# Patient Record
Sex: Male | Born: 1955 | Race: White | Hispanic: No | Marital: Married | Smoking: Never smoker
Health system: Southern US, Community
[De-identification: ages and names within clinical notes are randomized; demographics above are authoritative.]

## PROBLEM LIST (undated history)

## (undated) DIAGNOSIS — I10 Essential (primary) hypertension: Secondary | ICD-10-CM

## (undated) DIAGNOSIS — I480 Paroxysmal atrial fibrillation: Secondary | ICD-10-CM

## (undated) DIAGNOSIS — G4733 Obstructive sleep apnea (adult) (pediatric): Secondary | ICD-10-CM

## (undated) HISTORY — PX: VASECTOMY: SHX75

## (undated) HISTORY — DX: Essential (primary) hypertension: I10

## (undated) HISTORY — DX: Obstructive sleep apnea (adult) (pediatric): G47.33

## (undated) HISTORY — PX: TONSILLECTOMY: SUR1361

## (undated) HISTORY — DX: Paroxysmal atrial fibrillation: I48.0

## (undated) HISTORY — PX: CHOLECYSTECTOMY: SHX55

## (undated) HISTORY — PX: ABLATION: SHX5711

---

## 2000-07-21 ENCOUNTER — Ambulatory Visit (HOSPITAL_COMMUNITY): Admission: RE | Admit: 2000-07-21 | Discharge: 2000-07-21 | Payer: Self-pay | Admitting: Family Medicine

## 2000-07-21 ENCOUNTER — Encounter: Payer: Self-pay | Admitting: Family Medicine

## 2000-12-14 ENCOUNTER — Ambulatory Visit (HOSPITAL_COMMUNITY): Admission: RE | Admit: 2000-12-14 | Discharge: 2000-12-15 | Payer: Self-pay | Admitting: General Surgery

## 2000-12-14 ENCOUNTER — Encounter (HOSPITAL_BASED_OUTPATIENT_CLINIC_OR_DEPARTMENT_OTHER): Payer: Self-pay | Admitting: General Surgery

## 2011-07-01 DIAGNOSIS — I480 Paroxysmal atrial fibrillation: Secondary | ICD-10-CM

## 2011-07-01 HISTORY — DX: Paroxysmal atrial fibrillation: I48.0

## 2013-05-20 ENCOUNTER — Telehealth: Payer: Self-pay

## 2013-05-20 MED ORDER — METOPROLOL SUCCINATE ER 25 MG PO TB24: 25.0000 mg | ORAL_TABLET | Freq: Every day | ORAL | Status: DC

## 2013-05-20 MED ORDER — AMLODIPINE BESY-BENAZEPRIL HCL 10-40 MG PO CAPS: 1.0000 | ORAL_CAPSULE | Freq: Every day | ORAL | Status: DC

## 2013-05-20 NOTE — Telephone Encounter (Signed)
Pt aware meds sent in

## 2013-05-21 ENCOUNTER — Encounter: Payer: Self-pay | Admitting: *Deleted

## 2013-05-21 ENCOUNTER — Encounter: Payer: Self-pay | Admitting: Cardiology

## 2013-05-21 DIAGNOSIS — G4733 Obstructive sleep apnea (adult) (pediatric): Secondary | ICD-10-CM | POA: Insufficient documentation

## 2013-05-21 DIAGNOSIS — I480 Paroxysmal atrial fibrillation: Secondary | ICD-10-CM | POA: Insufficient documentation

## 2013-05-24 ENCOUNTER — Encounter: Payer: Self-pay | Admitting: Cardiology

## 2013-05-24 ENCOUNTER — Encounter (INDEPENDENT_AMBULATORY_CARE_PROVIDER_SITE_OTHER): Payer: Self-pay

## 2013-05-24 ENCOUNTER — Ambulatory Visit (INDEPENDENT_AMBULATORY_CARE_PROVIDER_SITE_OTHER): Payer: PRIVATE HEALTH INSURANCE | Admitting: Cardiology

## 2013-05-24 VITALS — BP 140/92 | HR 67 | Ht 75.0 in | Wt 251.0 lb

## 2013-05-24 DIAGNOSIS — I10 Essential (primary) hypertension: Secondary | ICD-10-CM

## 2013-05-24 DIAGNOSIS — I4891 Unspecified atrial fibrillation: Secondary | ICD-10-CM

## 2013-05-24 DIAGNOSIS — G4733 Obstructive sleep apnea (adult) (pediatric): Secondary | ICD-10-CM

## 2013-05-24 NOTE — Progress Notes (Signed)
  [address] [place], [place]  [address] Phone: [phone] Fax:  [phone]  Date:  05/24/2013   ID:  Adrian Clark, DOB 10-29-1955, MRN [id number]  PCP:  No primary provider on file.  Cardiologist:  Armanda Magic, MD     History of Present Illness: Adrian Clark is a 57 y.o. male with a history of atrial fibrillation, HTN and OSA who presents today for followup.  He is doing well.  He denies any chest pain, SOB, DOE, LE edema, dizziness, palpitations or syncope.  He tolerates his CPAP well.  He tolerates the mask and feels the pressure is adequate. He feels rested in the am and has no daytime sleepiness.   Wt Readings from Last 3 Encounters:  05/24/13 251 lb (113.853 kg)     Past Medical History  Diagnosis Date  . OSA (obstructive sleep apnea)   . HTN (hypertension)   . Atrial fibrillation 2013    s/p ablation     Current Outpatient Prescriptions  Medication Sig Dispense Refill  . amLODipine-benazepril (LOTREL) 10-40 MG per capsule Take 1 capsule by mouth daily.  30 capsule  6  . aspirin 325 MG tablet Take 325 mg by mouth daily.      Marland Kitchen HAWTHORN BERRY PO Take by mouth 2 (two) times daily.      . metoprolol succinate (TOPROL XL) 25 MG 24 hr tablet Take 1 tablet (25 mg total) by mouth daily.  30 tablet  6  . Vitamins-Lipotropics (LIPOFLAVONOID) TABS Take by mouth 2 (two) times daily.       No current facility-administered medications for this visit.    Allergies:   No Known Allergies  Social History:  The patient  reports that he has never smoked. He does not have any smokeless tobacco history on file. He reports that he does not use illicit drugs.   Family History:  The patient's family history includes Prostate cancer in his father; Uterine cancer in his mother.   ROS:  Please see the history of present illness.      All other systems reviewed and negative.   PHYSICAL EXAM: VS:  Ht 6\' 3"  (1.905 m)  Wt 251 lb (113.853 kg)  BMI 31.37 kg/m2 Well  nourished, well developed, in no acute distress HEENT: normal Neck: no JVD Cardiac:  normal S1, S2; RRR; no murmur Lungs:  clear to auscultation bilaterally, no wheezing, rhonchi or rales Abd: soft, nontender, no hepatomegaly Ext: no edema Skin: warm and dry Neuro:  CNs 2-12 intact, no focal abnormalities noted   EKG:  NSR with LVH by voltage with no ST changes    ASSESSMENT AND PLAN:  1. PAF s/p afib ablation and maintaining NSR  - continue ASA and Toprol 2. OSA on CPAP and tolerating well  - he will drop off his SD card for a download this week 3. HTN with slightly elevated DBP today - at home his BP runs around 130/65mmHg  - continue Toprol and Lotrel  Followup with me in 1 year  Signed, Armanda Magic, MD 05/24/2013 4:57 PM

## 2013-05-24 NOTE — Patient Instructions (Signed)
Your physician recommends that you continue on your current medications as directed. Please refer to the Current Medication list given to you today.  Please remember to bring your download card in this week or next. We are closed on Thanksgiving and also I will not be here Friday 06/03/13.  Your physician wants you to follow-up in: 1 Year f/u with Dr. Sherlyn Lick will receive a reminder letter in the mail two months in advance. If you don't receive a letter, please call our office to schedule the follow-up appointment.

## 2013-06-09 DIAGNOSIS — I10 Essential (primary) hypertension: Secondary | ICD-10-CM | POA: Insufficient documentation

## 2013-06-09 DIAGNOSIS — Z8679 Personal history of other diseases of the circulatory system: Secondary | ICD-10-CM | POA: Insufficient documentation

## 2013-06-24 ENCOUNTER — Encounter: Payer: Self-pay | Admitting: Cardiology

## 2013-07-08 ENCOUNTER — Emergency Department (HOSPITAL_COMMUNITY): Payer: 59

## 2013-07-08 ENCOUNTER — Inpatient Hospital Stay (HOSPITAL_COMMUNITY)
Admission: EM | Admit: 2013-07-08 | Discharge: 2013-07-12 | DRG: 310 | Disposition: A | Discharge status: Home / Self Care | Payer: 59 | Attending: Cardiology | Admitting: Cardiology

## 2013-07-08 ENCOUNTER — Ambulatory Visit (INDEPENDENT_AMBULATORY_CARE_PROVIDER_SITE_OTHER): Payer: 59 | Admitting: *Deleted

## 2013-07-08 ENCOUNTER — Encounter (HOSPITAL_COMMUNITY): Payer: Self-pay | Admitting: Emergency Medicine

## 2013-07-08 ENCOUNTER — Telehealth: Payer: Self-pay | Admitting: Cardiology

## 2013-07-08 VITALS — BP 108/72 | HR 133 | Ht 75.0 in | Wt 249.0 lb

## 2013-07-08 DIAGNOSIS — I4891 Unspecified atrial fibrillation: Secondary | ICD-10-CM

## 2013-07-08 DIAGNOSIS — Z8042 Family history of malignant neoplasm of prostate: Secondary | ICD-10-CM

## 2013-07-08 DIAGNOSIS — Z8049 Family history of malignant neoplasm of other genital organs: Secondary | ICD-10-CM

## 2013-07-08 DIAGNOSIS — I480 Paroxysmal atrial fibrillation: Secondary | ICD-10-CM | POA: Diagnosis present

## 2013-07-08 DIAGNOSIS — Z9852 Vasectomy status: Secondary | ICD-10-CM

## 2013-07-08 DIAGNOSIS — G4733 Obstructive sleep apnea (adult) (pediatric): Secondary | ICD-10-CM | POA: Diagnosis present

## 2013-07-08 DIAGNOSIS — Z9089 Acquired absence of other organs: Secondary | ICD-10-CM

## 2013-07-08 DIAGNOSIS — I10 Essential (primary) hypertension: Secondary | ICD-10-CM | POA: Diagnosis present

## 2013-07-08 LAB — CBC
HCT: 44 % (ref 39.0–52.0)
HEMOGLOBIN: 15.1 g/dL (ref 13.0–17.0)
MCH: 29 pg (ref 26.0–34.0)
MCHC: 34.3 g/dL (ref 30.0–36.0)
MCV: 84.5 fL (ref 78.0–100.0)
PLATELETS: 352 10*3/uL (ref 150–400)
RBC: 5.21 MIL/uL (ref 4.22–5.81)
RDW: 13.3 % (ref 11.5–15.5)
WBC: 9.4 10*3/uL (ref 4.0–10.5)

## 2013-07-08 LAB — BASIC METABOLIC PANEL
BUN: 24 mg/dL — ABNORMAL HIGH (ref 6–23)
CO2: 23 mEq/L (ref 19–32)
Calcium: 9.3 mg/dL (ref 8.4–10.5)
Chloride: 101 mEq/L (ref 96–112)
Creatinine, Ser: 1.12 mg/dL (ref 0.50–1.35)
GFR calc Af Amer: 82 mL/min — ABNORMAL LOW (ref >90)
GFR, EST NON AFRICAN AMERICAN: 71 mL/min — AB (ref >90)
GLUCOSE: 101 mg/dL — AB (ref 70–99)
POTASSIUM: 4.1 meq/L (ref 3.7–5.3)
SODIUM: 139 meq/L (ref 137–147)

## 2013-07-08 LAB — MAGNESIUM: MAGNESIUM: 2.2 mg/dL (ref 1.5–2.5)

## 2013-07-08 LAB — POCT I-STAT TROPONIN I: Troponin i, poc: 0 ng/mL (ref 0.00–0.08)

## 2013-07-08 LAB — PRO B NATRIURETIC PEPTIDE: PRO B NATRI PEPTIDE: 950.9 pg/mL — AB (ref 0–125)

## 2013-07-08 MED ORDER — DILTIAZEM LOAD VIA INFUSION
10.0000 mg | Freq: Once | INTRAVENOUS | Status: AC
  Administered 2013-07-08: 10 mg via INTRAVENOUS
  Filled 2013-07-08: qty 10

## 2013-07-08 MED ORDER — METOPROLOL SUCCINATE ER 25 MG PO TB24
25.0000 mg | ORAL_TABLET | Freq: Every day | ORAL | Status: DC
  Administered 2013-07-09: 25 mg via ORAL
  Filled 2013-07-08: qty 1

## 2013-07-08 MED ORDER — SODIUM CHLORIDE 0.9 % IV BOLUS (SEPSIS)
1000.0000 mL | Freq: Once | INTRAVENOUS | Status: AC
  Administered 2013-07-08: 1000 mL via INTRAVENOUS

## 2013-07-08 MED ORDER — DILTIAZEM HCL 100 MG IV SOLR
5.0000 mg/h | INTRAVENOUS | Status: DC
  Administered 2013-07-08 (×2): 5 mg/h via INTRAVENOUS
  Filled 2013-07-08: qty 100

## 2013-07-08 MED ORDER — RIVAROXABAN 20 MG PO TABS
20.0000 mg | ORAL_TABLET | Freq: Every day | ORAL | Status: DC
  Administered 2013-07-08 – 2013-07-12 (×5): 20 mg via ORAL
  Filled 2013-07-08 (×5): qty 1

## 2013-07-08 MED ORDER — DILTIAZEM HCL 100 MG IV SOLR: 5.0000 mg/h | INTRAVENOUS | Status: DC

## 2013-07-08 MED ORDER — ACETAMINOPHEN 325 MG PO TABS: 650.0000 mg | ORAL_TABLET | ORAL | Status: DC | PRN

## 2013-07-08 MED ORDER — ONDANSETRON HCL 4 MG/2ML IJ SOLN: 4.0000 mg | Freq: Four times a day (QID) | INTRAMUSCULAR | Status: DC | PRN

## 2013-07-08 MED ORDER — DILTIAZEM HCL 100 MG IV SOLR
5.0000 mg/h | INTRAVENOUS | Status: DC
  Administered 2013-07-08 – 2013-07-09 (×2): 5 mg/h via INTRAVENOUS
  Filled 2013-07-08 (×2): qty 100

## 2013-07-08 NOTE — ED Notes (Signed)
Patient sent from Dr. Landis Gandy office, pt states he felt irreg since Wednesday.  Currently in afib, rate around 130-150,  Denies chest pain, sob, dizziness.  Previous diagnosed in 2012, had previous ablation and cardioversion.

## 2013-07-08 NOTE — ED Provider Notes (Signed)
CSN: [id number]     Arrival date & time 07/08/13  1619 History   First MD Initiated Contact with Patient 07/08/13 1634     Chief Complaint  Patient presents with  . Atrial Fibrillation   (Consider location/radiation/quality/duration/timing/severity/associated sxs/prior Treatment) HPI 58 yo M presents with tachycardia.   No chest pain, mild SOB / no radiation / shortness of breath, dyspnea on exertion / intermittently / moderate severity / no other associated symptoms / Has been treated prior for afib in the past, ablation 2 years ago.   Past Medical History  Diagnosis Date  . OSA (obstructive sleep apnea)   . HTN (hypertension)   . Atrial fibrillation 2013    s/p ablation    Past Surgical History  Procedure Laterality Date  . Cholecystectomy    . Tonsillectomy    . Vasectomy     Family History  Problem Relation Age of Onset  . Uterine cancer Mother   . Prostate cancer Father    History  Substance Use Topics  . Smoking status: Never Smoker   . Smokeless tobacco: Not on file  . Alcohol Use: Not on file    Review of Systems  Constitutional: Negative for fever and chills.  HENT: Negative for sore throat.   Eyes: Negative for pain.  Respiratory: Positive for shortness of breath. Negative for cough.   Cardiovascular: Positive for palpitations. Negative for chest pain.  Gastrointestinal: Negative for nausea, vomiting and abdominal pain.  Genitourinary: Negative for dysuria and flank pain.  Musculoskeletal: Negative for back pain and neck pain.  Skin: Negative for rash.  Neurological: Negative for seizures and headaches.    Allergies  Review of patient's allergies indicates no known allergies.  Home Medications   No current outpatient prescriptions on file. BP 111/74  Pulse 102  Temp(Src) 98.1 F (36.7 C) (Oral)  Resp 14  SpO2 97% Physical Exam  Constitutional: He is oriented to person, place, and time. He appears well-developed and well-nourished. No distress.   HENT:  Head: Normocephalic and atraumatic.  Eyes: Pupils are equal, round, and reactive to light.  Neck: Normal range of motion.  Cardiovascular: An irregularly irregular rhythm present. Tachycardia present.   Pulmonary/Chest: Effort normal and breath sounds normal.  Abdominal: Soft. He exhibits no distension. There is no tenderness.  Musculoskeletal: Normal range of motion.  Neurological: He is alert and oriented to person, place, and time.  Skin: Skin is warm. He is not diaphoretic.    ED Course  Procedures (including critical care time) Labs Review Labs Reviewed  BASIC METABOLIC PANEL - Abnormal; Notable for the following:    Glucose, Bld 101 (*)    BUN 24 (*)    GFR calc non Af Amer 71 (*)    GFR calc Af Amer 82 (*)    All other components within normal limits  PRO B NATRIURETIC PEPTIDE - Abnormal; Notable for the following:    Pro B Natriuretic peptide (BNP) 950.9 (*)    All other components within normal limits  CBC  POCT I-STAT TROPONIN I   Imaging Review Dg Chest Port 1 View  07/08/2013   CLINICAL DATA:  Atrial fibrillation  EXAM: PORTABLE CHEST - 1 VIEW  COMPARISON:  None.  FINDINGS: The heart size and mediastinal contours are within normal limits. Both lungs are clear. The visualized skeletal structures are unremarkable.  IMPRESSION: No active disease.   Electronically Signed   By: Inez Catalina M.D.   On: 07/08/2013 17:10    EKG  Interpretation    Date/Time:  Friday July 08 2013 16:23:18 EST Ventricular Rate:  143 PR Interval:    QRS Duration: 94 QT Interval:  290 QTC Calculation: 447 R Axis:   -27 Text Interpretation:  Atrial fibrillation with rapid ventricular response Nonspecific ST abnormality , probably digitalis effect Abnormal ECG remote prior EKG with sinus rhythm Confirmed by HORTON  MD, COURTNEY ([id number]) on 07/08/2013 4:37:18 PM            MDM   1. Atrial fibrillation with rapid ventricular response   2. Atrial fibrillation with RVR    58  yo M with hx of prior afib s/p ablation presents as transfer from Cardiology office for low BP (SBP in 90s) and afib with RVR (HR 140s-150s).   Upon arrival here, patient still tachycardic (120s), otherwise HDS. EKG and PE c/w afib with RVR. Patient previously on Toprol XL. Will attempt dilt drip, bolus.   Dilt bolus caused soft BPs, fluids given. Patient with no change in symptoms/status. BPs rebounded. Normotensive. Patient with improvement in HR (now in 90s-100s). Will continue dilt gtt, admit to cardiology. Cards consulted, triaged to hospital. Admitted in stable condition. Patient seen and evaluated by myself and my attending, Dr. Dina Rich.     Freddi Che, MD 07/08/13 2132

## 2013-07-08 NOTE — Progress Notes (Signed)
Pt in for an EKG. Pt states that he donated blood before going into  A-fib. EKG done per nurse read per Dr. [name] MD. A-fib 133 beats/minute. BP right arm 108/72 left arm 98/66. Pt states feels light headed when going up a couple of flights of stairs. Dr. [name] recommended for pt to go to the ER at [facility].  Pt agreed, wife will drive pt to the ER. Chris in the ER and Wannetta Sender trent aware.

## 2013-07-08 NOTE — Telephone Encounter (Signed)
Please have patient see Truitt Merle or Richardson Dopp and if no openings have him come in for an EKG check

## 2013-07-08 NOTE — ED Provider Notes (Signed)
I saw and evaluated the patient, reviewed the resident's note and I agree with the findings and plan.  EKG Interpretation    Date/Time:  Friday July 08 2013 16:23:18 EST Ventricular Rate:  143 PR Interval:    QRS Duration: 94 QT Interval:  290 QTC Calculation: 447 R Axis:   -27 Text Interpretation:  Atrial fibrillation with rapid ventricular response Nonspecific ST abnormality , probably digitalis effect Abnormal ECG remote prior EKG with sinus rhythm Confirmed by HORTON  MD, COURTNEY ([id number]) on 07/08/2013 4:37:18 PM            Patient presents in A. fib with RVR. He has a history of the same. He is not currently on anticoagulation. Initial heart rate in the 130s and 140s. Patient was started on a diltiazem drip. He has had no chest pain. Patient was rate controlled in the ER. He will be admitted to the cardiology service. He is a patient of Dr. Tanna Furry.  Merryl Hacker, MD 07/08/13 2250

## 2013-07-08 NOTE — Telephone Encounter (Signed)
TO Dr Turner to advise.  

## 2013-07-08 NOTE — ED Notes (Signed)
Dr. Walton at bedside 

## 2013-07-08 NOTE — Telephone Encounter (Signed)
New message     Pt says he is in afib and want to see dr turner today

## 2013-07-08 NOTE — Telephone Encounter (Signed)
Follow UP:  Pt states he is still waiting for a call back.

## 2013-07-08 NOTE — ED Notes (Signed)
X-ray at bedside

## 2013-07-08 NOTE — ED Notes (Signed)
Presents with atrial fibrillation rate of 140s-150s, began weds associated with SOB with exertion denies pain. Pt alert, oriented, answers all questions appropriately.

## 2013-07-08 NOTE — H&P (Signed)
Adrian Clark is an 58 y.o. male.   Chief Complaint: Afib HPI:  Adrian Clark is a 58 y.o. male with a history of atrial fibrillation-with ablation two years ago, HTN and OSA.  He wears a CPAP.  He noticed on Wednesday that his HR was irregular and had DOE after climbing stairs more than baseline. He was seen  By Dr. [name] today and an EKG revealed Afib RVR with a rate in the 140's.  He was sent to the ER.   He otherwise denies nausea, vomiting, fever, chest pain, orthopnea, dizziness, PND, cough, congestion, abdominal pain, hematochezia, melena, lower extremity edema, claudication.   Medications: Prior to Admission medications   Medication Sig Start Date End Date Taking? Authorizing Provider  amLODipine-benazepril (LOTREL) 10-40 MG per capsule Take 1 capsule by mouth daily. 05/20/13  Yes [name], MD  aspirin 325 MG tablet Take 325 mg by mouth daily.   Yes Historical Provider, MD  HAWTHORN BERRY PO Take by mouth 2 (two) times daily.   Yes Historical Provider, MD  metoprolol succinate (TOPROL XL) 25 MG 24 hr tablet Take 1 tablet (25 mg total) by mouth daily. 05/20/13  Yes [name], MD  Vitamins-Lipotropics (LIPOFLAVONOID) TABS Take by mouth 2 (two) times daily.   Yes Historical Provider, MD    Past Medical History  Diagnosis Date  . OSA (obstructive sleep apnea)   . HTN (hypertension)   . Atrial fibrillation 2013    s/p ablation     Past Surgical History  Procedure Laterality Date  . Cholecystectomy    . Tonsillectomy    . Vasectomy      Family History  Problem Relation Age of Onset  . Uterine cancer Mother   . Prostate cancer Father    Social History:  reports that he has never smoked. He does not have any smokeless tobacco history on file. He reports that he does not use illicit drugs. His alcohol history is not on file.  Allergies: No Known Allergies   (Not in a hospital admission)  Results for orders placed during the hospital encounter of 07/08/13  (from the past 48 hour(s))  CBC     Status: None   Collection Time    07/08/13  4:35 PM      Result Value Range   WBC 9.4  4.0 - 10.5 K/uL   RBC 5.21  4.22 - 5.81 MIL/uL   Hemoglobin 15.1  13.0 - 17.0 g/dL   HCT 44.0  39.0 - 52.0 %   MCV 84.5  78.0 - 100.0 fL   MCH 29.0  26.0 - 34.0 pg   MCHC 34.3  30.0 - 36.0 g/dL   RDW 13.3  11.5 - 15.5 %   Platelets 352  150 - 400 K/uL  BASIC METABOLIC PANEL     Status: Abnormal   Collection Time    07/08/13  4:35 PM      Result Value Range   Sodium 139  137 - 147 mEq/L   Potassium 4.1  3.7 - 5.3 mEq/L   Chloride 101  96 - 112 mEq/L   CO2 23  19 - 32 mEq/L   Glucose, Bld 101 (*) 70 - 99 mg/dL   BUN 24 (*) 6 - 23 mg/dL   Creatinine, Ser 1.12  0.50 - 1.35 mg/dL   Calcium 9.3  8.4 - 10.5 mg/dL   GFR calc non Af Amer 71 (*) >90 mL/min   GFR calc Af Amer 82 (*) >  90 mL/min   Comment: (NOTE)     The eGFR has been calculated using the CKD EPI equation.     This calculation has not been validated in all clinical situations.     eGFR's persistently <90 mL/min signify possible Chronic Kidney     Disease.  POCT I-STAT TROPONIN I     Status: None   Collection Time    07/08/13  5:12 PM      Result Value Range   Troponin i, poc 0.00  0.00 - 0.08 ng/mL   Comment 3            Comment: Due to the release kinetics of cTnI,     a negative result within the first hours     of the onset of symptoms does not rule out     myocardial infarction with certainty.     If myocardial infarction is still suspected,     repeat the test at appropriate intervals.   Dg Chest Port 1 View  07/08/2013   CLINICAL DATA:  Atrial fibrillation  EXAM: PORTABLE CHEST - 1 VIEW  COMPARISON:  None.  FINDINGS: The heart size and mediastinal contours are within normal limits. Both lungs are clear. The visualized skeletal structures are unremarkable.  IMPRESSION: No active disease.   Electronically Signed   By: Inez Catalina M.D.   On: 07/08/2013 17:10    Review of Systems   Constitutional: Negative for fever and diaphoresis.  HENT: Negative for congestion and sore throat.   Respiratory: Positive for shortness of breath (Only with exertion). Negative for cough.   Cardiovascular: Positive for palpitations. Negative for chest pain, claudication and leg swelling.  Gastrointestinal: Negative for nausea, vomiting, blood in stool and melena.  Musculoskeletal: Negative for myalgias.  Neurological: Negative for dizziness.  All other systems reviewed and are negative.    Blood pressure 85/61, pulse 48, temperature 98.1 F (36.7 C), temperature source Oral, resp. rate 17, SpO2 95.00%. Physical Exam  Constitutional: He is oriented to person, place, and time. He appears well-developed and well-nourished. No distress.  HENT:  Head: Normocephalic and atraumatic.  Mouth/Throat: Oropharynx is clear and moist. No oropharyngeal exudate.  Eyes: EOM are normal. Pupils are equal, round, and reactive to light. No scleral icterus.  Neck: Normal range of motion. Neck supple. No JVD present.  Cardiovascular: An irregularly irregular rhythm present. Tachycardia present.   No murmur heard. Pulses:      Radial pulses are 2+ on the right side, and 2+ on the left side.       Dorsalis pedis pulses are 2+ on the right side, and 2+ on the left side.  No Carotid Bruit.  Respiratory: Effort normal and breath sounds normal. He has no wheezes. He has no rales.  GI: Soft. Bowel sounds are normal. He exhibits no distension. There is no tenderness.  Musculoskeletal: He exhibits no edema.  Neurological: He is alert and oriented to person, place, and time. He exhibits normal muscle tone.  Skin: Skin is warm and dry.  Psychiatric: He has a normal mood and affect.     Assessment/Plan Principal Problem:   Atrial fibrillation with rapid ventricular response Active Problems:   OSA (obstructive sleep apnea)   HTN (hypertension)  Plan:  58 yo male with history of afib ablation two years ago.   He went back into afib this past Wednesday.  He takes 347m of ASA daily.   He was given 113mIV cadizem bolus and started on 72m52m  hr.   HR in the 90's.   He did become hypotensive with BP of 85/61 but is stable now.   His CHADSVASC score is 1.  Afib began > 48hrs ago.  If we can gain adequate rate control by tomorrow recommend DC with OP TEE/DCCV.  Stop amlodipine/benazepril. Continue cardizem, Toprol and start Xarelto.    HAGER, BRYAN 07/08/2013, 7:04 PM   Attending note:  Patient seen and examined. Reviewed records and discussed the case with Mr. Samara Snide. Mr. Hornaday has a history of atrial fibrillation status post ablation 2 years ago, has a CHADSVASC score of 1, was temporarily on Xarelto around the time of his procedure. He has been on aspirin since that time and has done quite well with no recurrent sense of palpitations until Wednesday morning of this week. He has had no other major symptoms recently in the not sleeping well and being somewhat fatigued. No chest pain or unusual breathlessness. He was seen in the office and noted to be in rapid atrial fibrillation, since the ER for admission. He has been placed on a diltiazem infusion with heart rate coming under better control, remains in atrial fibrillation. Blood pressure was somewhat low with systolics down into the 71I although stabilizing now. Initial point-of-care troponin I negative, normal potassium and renal function, normal hemoglobin and platelets. ECG shows atrial fibrillation with rapid ventricular response, leftward axis, nonspecific ST changes. Plan is admission to the hospital for rate control and hopefully conversion to oral regimen tomorrow (possibly Cardizem CD 120 mg daily depending on rate response to intravenous diltiazem) as well as initiation of Xarelto. If he does well clinically, he may be ready for discharge tomorrow with a plan to pursue TEE guided cardioversion. Whether he needs to consider antiarrhythmic therapy is not  entirely clear at this point. He will continue to follow up with Dr. [name], with whom I discussed the case.   Satira Sark, M.D., F.A.C.C.

## 2013-07-08 NOTE — Telephone Encounter (Signed)
Pt is aware to come in. He will be here at 3. Sharyn Lull agreed to do ekg

## 2013-07-08 NOTE — ED Notes (Signed)
After diltiazem bolus, patient's blood pressure decreased to 85/61, diltiazem stopped.  Advised Dr. Silvio Clayman, see new orders.

## 2013-07-09 DIAGNOSIS — G4733 Obstructive sleep apnea (adult) (pediatric): Secondary | ICD-10-CM

## 2013-07-09 DIAGNOSIS — I10 Essential (primary) hypertension: Secondary | ICD-10-CM

## 2013-07-09 LAB — BASIC METABOLIC PANEL
BUN: 20 mg/dL (ref 6–23)
CHLORIDE: 103 meq/L (ref 96–112)
CO2: 25 meq/L (ref 19–32)
Calcium: 8.3 mg/dL — ABNORMAL LOW (ref 8.4–10.5)
Creatinine, Ser: 1 mg/dL (ref 0.50–1.35)
GFR calc Af Amer: >90 mL/min (ref >90)
GFR calc non Af Amer: 81 mL/min — ABNORMAL LOW (ref >90)
Glucose, Bld: 82 mg/dL (ref 70–99)
Potassium: 4.3 mEq/L (ref 3.7–5.3)
Sodium: 140 mEq/L (ref 137–147)

## 2013-07-09 LAB — HEMOGLOBIN A1C
HEMOGLOBIN A1C: 5.7 % — AB (ref <5.7)
Mean Plasma Glucose: 117 mg/dL — ABNORMAL HIGH (ref <117)

## 2013-07-09 LAB — LIPID PANEL
Cholesterol: 159 mg/dL (ref 0–200)
HDL: 42 mg/dL (ref >39)
LDL Cholesterol: 99 mg/dL (ref 0–99)
TRIGLYCERIDES: 89 mg/dL (ref <150)
Total CHOL/HDL Ratio: 3.8 RATIO
VLDL: 18 mg/dL (ref 0–40)

## 2013-07-09 LAB — TSH: TSH: 2.267 u[IU]/mL (ref 0.350–4.500)

## 2013-07-09 MED ORDER — DILTIAZEM HCL ER COATED BEADS 120 MG PO CP24
120.0000 mg | ORAL_CAPSULE | Freq: Every day | ORAL | Status: DC
  Administered 2013-07-09: 120 mg via ORAL
  Filled 2013-07-09 (×2): qty 1

## 2013-07-09 NOTE — Progress Notes (Signed)
Patient placed himself on cpap pressure of 6cmH20 via nasal mask. Patient is tolerating cpap well at this time.

## 2013-07-09 NOTE — Progress Notes (Signed)
SUBJECTIVE: Pt remains in atrial fibrillation, HR 110-117 bpm, with diltiazem drip at 5 mg/hr. Occasionally has intermittent chest tightness, described as mild, and due to anxiety from being hospitalized. Denies shortness of breath and leg swelling.    No intake or output data in the 24 hours ending 07/09/13 1257  Current Facility-Administered Medications  Medication Dose Route Frequency Provider Last Rate Last Dose  . acetaminophen (TYLENOL) tablet 650 mg  650 mg Oral Q4H PRN [name], PA-C      . diltiazem (CARDIZEM) 100 mg in dextrose 5 % 100 mL infusion  5-15 mg/hr Intravenous Titrated Satira Sark, MD 5 mL/hr at 07/08/13 2314 5 mg/hr at 07/08/13 2314  . metoprolol succinate (TOPROL-XL) 24 hr tablet 25 mg  25 mg Oral Daily [name], PA-C   25 mg at 07/09/13 0959  . ondansetron (ZOFRAN) injection 4 mg  4 mg Intravenous Q6H PRN [name], PA-C      . Rivaroxaban (XARELTO) tablet 20 mg  20 mg Oral Q supper [name], PA-C   20 mg at 07/08/13 2254    Filed Vitals:   07/09/13 0000 07/09/13 0400 07/09/13 0800 07/09/13 0959  BP: 116/77 125/82 129/81 118/92  Pulse: 81 80 95 84  Temp: 97.7 F (36.5 C) 98.1 F (36.7 C) 97.6 F (36.4 C)   TempSrc: Oral Oral Oral   Resp: 18 18 18    Height:      Weight:  250 lb 12.8 oz (113.762 kg)    SpO2: 97% 96% 98%     PHYSICAL EXAM General: NAD Neck: No JVD, no thyromegaly or thyroid nodule.  Lungs: Clear to auscultation bilaterally with normal respiratory effort. CV: Nondisplaced PMI.  Irregular rhythm, normal S1/S2, no murmur.  No pretibial edema.  No carotid bruit.  Normal pedal pulses.  Abdomen: Soft, nontender, no hepatosplenomegaly, no distention.  Neurologic: Alert and oriented x 3.  Psych: Normal affect. Extremities: No clubbing or cyanosis.   TELEMETRY: Reviewed telemetry pt in atrial fibrillation, occasional PVC's, HR 105-120 bpm.  LABS: Basic Metabolic Panel:  Recent Labs  07/08/13 1635 07/08/13 2245  07/09/13 0430  NA 139  --  140  K 4.1  --  4.3  CL 101  --  103  CO2 23  --  25  GLUCOSE 101*  --  82  BUN 24*  --  20  CREATININE 1.12  --  1.00  CALCIUM 9.3  --  8.3*  MG  --  2.2  --    Liver Function Tests: No results found for this basename: AST, ALT, ALKPHOS, BILITOT, PROT, ALBUMIN,  in the last 72 hours No results found for this basename: LIPASE, AMYLASE,  in the last 72 hours CBC:  Recent Labs  07/08/13 1635  WBC 9.4  HGB 15.1  HCT 44.0  MCV 84.5  PLT 352   Cardiac Enzymes: No results found for this basename: CKTOTAL, CKMB, CKMBINDEX, TROPONINI,  in the last 72 hours BNP: No components found with this basename: POCBNP,  D-Dimer: No results found for this basename: DDIMER,  in the last 72 hours Hemoglobin A1C: No results found for this basename: HGBA1C,  in the last 72 hours Fasting Lipid Panel:  Recent Labs  07/09/13 0430  CHOL 159  HDL 42  LDLCALC 99  TRIG 89  CHOLHDL 3.8   Thyroid Function Tests: No results found for this basename: TSH, T4TOTAL, FREET3, T3FREE, THYROIDAB,  in the last 72 hours Anemia Panel: No  results found for this basename: VITAMINB12, FOLATE, FERRITIN, TIBC, IRON, RETICCTPCT,  in the last 72 hours  RADIOLOGY: Dg Chest Port 1 View  07/08/2013   CLINICAL DATA:  Atrial fibrillation  EXAM: PORTABLE CHEST - 1 VIEW  COMPARISON:  None.  FINDINGS: The heart size and mediastinal contours are within normal limits. Both lungs are clear. The visualized skeletal structures are unremarkable.  IMPRESSION: No active disease.   Electronically Signed   By: Inez Catalina M.D.   On: 07/08/2013 17:10      ASSESSMENT AND PLAN: 1. Atrial fibrillation with rapid ventricular response: I will initiate long-acting diltiazem 120 mg and attempt to wean off diltiazem infusion. I will stop Toprol-XL and continue Xarelto. If heart rate is better controlled tomorrow (both with ambulation and at rest), will hope to discharge with plan for outpatient TEE/DCCV. 2.  HTN: controlled on current therapy. No further hypotensive episodes.   Kate Sable, M.D., F.A.C.C.

## 2013-07-10 MED ORDER — DILTIAZEM HCL ER COATED BEADS 240 MG PO CP24
240.0000 mg | ORAL_CAPSULE | Freq: Every day | ORAL | Status: DC
  Administered 2013-07-10 – 2013-07-12 (×3): 240 mg via ORAL
  Filled 2013-07-10 (×3): qty 1

## 2013-07-10 MED ORDER — GUAIFENESIN-DM 100-10 MG/5ML PO SYRP
5.0000 mL | ORAL_SOLUTION | ORAL | Status: DC | PRN
  Administered 2013-07-10: 5 mL via ORAL
  Filled 2013-07-10: qty 5

## 2013-07-10 MED ORDER — AMIODARONE HCL 150 MG/3ML IV SOLN: 150.0000 mg | Freq: Once | INTRAVENOUS | Status: DC

## 2013-07-10 MED ORDER — AMIODARONE LOAD VIA INFUSION
150.0000 mg | Freq: Once | INTRAVENOUS | Status: AC
  Administered 2013-07-10: 150 mg via INTRAVENOUS
  Filled 2013-07-10: qty 83.34

## 2013-07-10 MED ORDER — AMIODARONE HCL IN DEXTROSE 360-4.14 MG/200ML-% IV SOLN
60.0000 mg/h | INTRAVENOUS | Status: AC
  Administered 2013-07-10 (×2): 60 mg/h via INTRAVENOUS
  Filled 2013-07-10 (×2): qty 200

## 2013-07-10 MED ORDER — AMIODARONE HCL IN DEXTROSE 360-4.14 MG/200ML-% IV SOLN
30.0000 mg/h | INTRAVENOUS | Status: DC
  Administered 2013-07-10 (×2): 30 mg/h via INTRAVENOUS
  Filled 2013-07-10 (×7): qty 200

## 2013-07-10 NOTE — Progress Notes (Signed)
SUBJECTIVE: Pt denies chest pain, palpitations, and shortness of breath. Anxious to go home.    No intake or output data in the 24 hours ending 07/10/13 0818  Current Facility-Administered Medications  Medication Dose Route Frequency Provider Last Rate Last Dose  . acetaminophen (TYLENOL) tablet 650 mg  650 mg Oral Q4H PRN [name], PA-C      . diltiazem (CARDIZEM CD) 24 hr capsule 120 mg  120 mg Oral Daily Herminio Commons, MD   120 mg at 07/09/13 1441  . diltiazem (CARDIZEM) 100 mg in dextrose 5 % 100 mL infusion  5-15 mg/hr Intravenous Titrated Satira Sark, MD 5 mL/hr at 07/09/13 1926 5 mg/hr at 07/09/13 1926  . guaiFENesin-dextromethorphan (ROBITUSSIN DM) 100-10 MG/5ML syrup 5 mL  5 mL Oral Q4H PRN Satira Sark, MD      . ondansetron [facility]) injection 4 mg  4 mg Intravenous Q6H PRN [name], PA-C      . Rivaroxaban (XARELTO) tablet 20 mg  20 mg Oral Q supper [name], PA-C   20 mg at 07/09/13 1653    Filed Vitals:   07/09/13 1400 07/09/13 2100 07/10/13 0006 07/10/13 0500  BP: 130/93 139/95 123/83 132/90  Pulse: 97 87 86 73  Temp: 97.6 F (36.4 C) 98.1 F (36.7 C) 97.9 F (36.6 C) 97.6 F (36.4 C)  TempSrc: Oral     Resp: 18 18 18 18   Height:      Weight:      SpO2: 100% 95% 96% 96%    PHYSICAL EXAM General: NAD Neck: No JVD, no thyromegaly or thyroid nodule.  Lungs: Clear to auscultation bilaterally with normal respiratory effort. CV: Nondisplaced PMI.  Irregular rhythm, normal S1/S2, no murmur.  No pretibial edema.  No carotid bruit.  Normal pedal pulses.  Abdomen: Soft, nontender, no hepatosplenomegaly, no distention.  Neurologic: Alert and oriented x 3.  Psych: Normal affect. Extremities: No clubbing or cyanosis.   TELEMETRY: Reviewed telemetry pt in atrial fibrillation, 100 bpm range.  LABS: Basic Metabolic Panel:  Recent Labs  07/08/13 1635 07/08/13 2245 07/09/13 0430  NA 139  --  140  K 4.1  --  4.3  CL 101  --  103    CO2 23  --  25  GLUCOSE 101*  --  82  BUN 24*  --  20  CREATININE 1.12  --  1.00  CALCIUM 9.3  --  8.3*  MG  --  2.2  --    Liver Function Tests: No results found for this basename: AST, ALT, ALKPHOS, BILITOT, PROT, ALBUMIN,  in the last 72 hours No results found for this basename: LIPASE, AMYLASE,  in the last 72 hours CBC:  Recent Labs  07/08/13 1635  WBC 9.4  HGB 15.1  HCT 44.0  MCV 84.5  PLT 352   Cardiac Enzymes: No results found for this basename: CKTOTAL, CKMB, CKMBINDEX, TROPONINI,  in the last 72 hours BNP: No components found with this basename: POCBNP,  D-Dimer: No results found for this basename: DDIMER,  in the last 72 hours Hemoglobin A1C:  Recent Labs  07/08/13 2245  HGBA1C 5.7*   Fasting Lipid Panel:  Recent Labs  07/09/13 0430  CHOL 159  HDL 42  LDLCALC 99  TRIG 89  CHOLHDL 3.8   Thyroid Function Tests:  Recent Labs  07/08/13 2245  TSH 2.267   Anemia Panel: No results found for this basename: VITAMINB12, FOLATE, FERRITIN, TIBC,  IRON, RETICCTPCT,  in the last 72 hours  RADIOLOGY: Dg Chest Port 1 View  07/08/2013   CLINICAL DATA:  Atrial fibrillation  EXAM: PORTABLE CHEST - 1 VIEW  COMPARISON:  None.  FINDINGS: The heart size and mediastinal contours are within normal limits. Both lungs are clear. The visualized skeletal structures are unremarkable.  IMPRESSION: No active disease.   Electronically Signed   By: Inez Catalina M.D.   On: 07/08/2013 17:10      ASSESSMENT AND PLAN: 1. Atrial fibrillation with rapid ventricular response: I will increase long-acting diltiazem to 240 mg daily and switch diltiazem infusion to IV amiodarone. Will continue Xarelto. If heart rate remains uncontrolled tomorrow, would consider TEE/DCCV.  2. HTN: reasonably controlled on current therapy. No further hypotensive episodes.    Kate Sable, M.D., F.A.C.C.

## 2013-07-10 NOTE — Progress Notes (Signed)
Patient is now wearing his home cpap. Patient said he could not tolerate our cpap unit/mask. Patient is tolerating his home cpap well at this time.

## 2013-07-11 ENCOUNTER — Encounter (HOSPITAL_COMMUNITY)
Admission: EM | Disposition: A | Discharge status: Home / Self Care | Payer: 59 | Source: Home / Self Care | Attending: Cardiology

## 2013-07-11 ENCOUNTER — Inpatient Hospital Stay (HOSPITAL_COMMUNITY): Payer: 59 | Admitting: Anesthesiology

## 2013-07-11 ENCOUNTER — Encounter (HOSPITAL_COMMUNITY): Payer: Self-pay | Admitting: *Deleted

## 2013-07-11 ENCOUNTER — Encounter (HOSPITAL_COMMUNITY): Payer: 59 | Admitting: Anesthesiology

## 2013-07-11 DIAGNOSIS — I059 Rheumatic mitral valve disease, unspecified: Secondary | ICD-10-CM

## 2013-07-11 HISTORY — PX: CARDIOVERSION: SHX1299

## 2013-07-11 HISTORY — PX: TEE WITHOUT CARDIOVERSION: SHX5443

## 2013-07-11 SURGERY — ECHOCARDIOGRAM, TRANSESOPHAGEAL
Anesthesia: Moderate Sedation

## 2013-07-11 MED ORDER — MIDAZOLAM HCL 5 MG/ML IJ SOLN
INTRAMUSCULAR | Status: AC
  Filled 2013-07-11: qty 2

## 2013-07-11 MED ORDER — LIDOCAINE VISCOUS 2 % MT SOLN
OROMUCOSAL | Status: AC
  Filled 2013-07-11: qty 15

## 2013-07-11 MED ORDER — LIDOCAINE VISCOUS 2 % MT SOLN
OROMUCOSAL | Status: DC | PRN
  Administered 2013-07-11: 5 mL via OROMUCOSAL

## 2013-07-11 MED ORDER — FENTANYL CITRATE 0.05 MG/ML IJ SOLN
INTRAMUSCULAR | Status: AC
  Filled 2013-07-11: qty 2

## 2013-07-11 MED ORDER — BUTAMBEN-TETRACAINE-BENZOCAINE 2-2-14 % EX AERO
INHALATION_SPRAY | CUTANEOUS | Status: DC | PRN
  Administered 2013-07-11: 2 via TOPICAL

## 2013-07-11 MED ORDER — MIDAZOLAM HCL 10 MG/2ML IJ SOLN
INTRAMUSCULAR | Status: DC | PRN
Start: 2013-07-11 — End: 2013-07-11
  Administered 2013-07-11 (×4): 2 mg via INTRAVENOUS

## 2013-07-11 MED ORDER — METOPROLOL SUCCINATE ER 25 MG PO TB24
25.0000 mg | ORAL_TABLET | Freq: Every day | ORAL | Status: DC
  Administered 2013-07-11 – 2013-07-12 (×2): 25 mg via ORAL
  Filled 2013-07-11 (×2): qty 1

## 2013-07-11 MED ORDER — SODIUM CHLORIDE 0.9 % IV SOLN: INTRAVENOUS | Status: DC

## 2013-07-11 MED ORDER — FENTANYL CITRATE 0.05 MG/ML IJ SOLN
INTRAMUSCULAR | Status: DC | PRN
  Administered 2013-07-11 (×4): 25 ug via INTRAVENOUS

## 2013-07-11 NOTE — Progress Notes (Signed)
Subjective: No Complaints other than he is still here.  Objective: Vital signs in last 24 hours: Temp:  [98.2 F (36.8 C)-98.3 F (36.8 C)] 98.3 F (36.8 C) (01/12 0500) Pulse Rate:  [55-84] 81 (01/12 0500) Resp:  [18-20] 20 (01/12 0500) BP: (113-139)/(78-103) 126/96 mmHg (01/12 0500) SpO2:  [97 %] 97 % (01/12 0500) Last BM Date: 07/08/13  Intake/Output from previous day: 01/11 0701 - 01/12 0700 In: 1384.7 [P.O.:840; I.V.:544.7] Out: -  Intake/Output this shift: Total I/O In: 360 [P.O.:360] Out: -   Medications Current Facility-Administered Medications  Medication Dose Route Frequency Provider Last Rate Last Dose  . acetaminophen (TYLENOL) tablet 650 mg  650 mg Oral Q4H PRN [name], PA-C      . amiodarone (NEXTERONE PREMIX) 360 mg/200 mL dextrose IV infusion  30 mg/hr Intravenous Continuous Herminio Commons, MD 16.7 mL/hr at 07/10/13 2141 30 mg/hr at 07/10/13 2141  . diltiazem (CARDIZEM CD) 24 hr capsule 240 mg  240 mg Oral Daily Herminio Commons, MD   240 mg at 07/10/13 1028  . guaiFENesin-dextromethorphan (ROBITUSSIN DM) 100-10 MG/5ML syrup 5 mL  5 mL Oral Q4H PRN Satira Sark, MD   5 mL at 07/10/13 0857  . ondansetron (ZOFRAN) injection 4 mg  4 mg Intravenous Q6H PRN [name], PA-C      . Rivaroxaban (XARELTO) tablet 20 mg  20 mg Oral Q supper [name], PA-C   20 mg at 07/10/13 1746    PE: General appearance: alert, cooperative and no distress Lungs: clear to auscultation bilaterally Heart: regular rate and rhythm Extremities: No LEE Pulses: 2+ and symmetric Skin: Warm and dry. Neurologic: Grossly normal  Lab Results:   Recent Labs  07/08/13 1635  WBC 9.4  HGB 15.1  HCT 44.0  PLT 352   BMET  Recent Labs  07/08/13 1635 07/09/13 0430  NA 139 140  K 4.1 4.3  CL 101 103  CO2 23 25  GLUCOSE 101* 82  BUN 24* 20  CREATININE 1.12 1.00  CALCIUM 9.3 8.3*   PT/INR No results found for this basename: LABPROT, INR,  in the last 72  hours Cholesterol  Recent Labs  07/09/13 0430  CHOL 159   Lipid Panel     Component Value Date/Time   CHOL 159 07/09/2013 0430   TRIG 89 07/09/2013 0430   HDL 42 07/09/2013 0430   CHOLHDL 3.8 07/09/2013 0430   VLDL 18 07/09/2013 0430   LDLCALC 99 07/09/2013 0430    Assessment/Plan   Principal Problem:   Atrial fibrillation with rapid ventricular response Active Problems:   OSA (obstructive sleep apnea)   HTN (hypertension)  Plan:  Afib is controlled currently in the 70's-80's.  He did have some RVR overnight.  On IV amio and Cardizem 240mg .  Trying to schedule TEE/DCCV for today.  I do not like amiodarone long term as this pt is only 58 yo.  BP stable.   LOS: 3 days    HAGER, BRYAN 07/11/2013 9:08 AM  TEE/DCCV scheduled for 4pm today.  Orders completed.  HAGER, BRYAN 9:39 AM   The patient was seen, examined and discussed with [name], PA-C and I agree with the above.   CLEARENCE VITUG is a 58 y.o. male with a history of atrial fibrillation-with ablation two years ago, HTN and OSA on CPAP. He was admitted with A-fib with RVR on 07/08/12, now better rate controlled. He is scheduled for a TEE/ CV this afternoon. On  Xarelto. Euvolemic.    Ena Dawley, Lemmie Evens 07/11/2013

## 2013-07-11 NOTE — Anesthesia Preprocedure Evaluation (Deleted)
Anesthesia Evaluation  Patient identified by MRN, date of birth, ID bandGeneral Assessment Comment:Sedated by ENDO for TEE  Reviewed: Allergy & Precautions, H&P , NPO status , Patient's Chart, lab work & pertinent test results  Airway Mallampati: II TM Distance: >3 FB Neck ROM: Full    Dental  (+) Teeth Intact and Dental Advisory Given   Pulmonary sleep apnea and Continuous Positive Airway Pressure Ventilation ,          Cardiovascular hypertension, Pt. on medications + dysrhythmias Atrial Fibrillation     Neuro/Psych    GI/Hepatic   Endo/Other    Renal/GU      Musculoskeletal   Abdominal   Peds  Hematology   Anesthesia Other Findings   Reproductive/Obstetrics                           Anesthesia Physical Anesthesia Plan  ASA: III  Anesthesia Plan:    Post-op Pain Management:    Induction: Intravenous  Airway Management Planned: Mask  Additional Equipment:   Intra-op Plan:   Post-operative Plan:   Informed Consent: I have reviewed the patients History and Physical, chart, labs and discussed the procedure including the risks, benefits and alternatives for the proposed anesthesia with the patient or authorized representative who has indicated his/her understanding and acceptance.   Dental advisory given  Plan Discussed with: CRNA, Anesthesiologist and Surgeon  Anesthesia Plan Comments:         Anesthesia Quick Evaluation

## 2013-07-11 NOTE — Progress Notes (Signed)
Patient has home CPAP and will place on self. RT will monitor.

## 2013-07-11 NOTE — Interval H&P Note (Signed)
History and Physical Interval Note:  07/11/2013 4:16 PM  Adrian Clark  has presented today for surgery, with the diagnosis of a-fib  The various methods of treatment have been discussed with the patient and family. After consideration of risks, benefits and other options for treatment, the patient has consented to  Procedure(s): TRANSESOPHAGEAL ECHOCARDIOGRAM (TEE) (N/A) CARDIOVERSION (N/A) as a surgical intervention .  The patient's history has been reviewed, patient examined, no change in status, stable for surgery.  I have reviewed the patient's chart and labs.  Questions were answered to the patient's satisfaction.     [name] R

## 2013-07-11 NOTE — Discharge Instructions (Addendum)
Information on my medicine - XARELTO (Rivaroxaban)  This medication education was reviewed with me or my healthcare representative as part of my discharge preparation.  The pharmacist that spoke with me during my hospital stay was:  Tad Moore, [facility]  Why was Xarelto prescribed for you? Xarelto was prescribed for you to reduce the risk of a blood clots forming after orthopedic surgery OR to reduce the risk of forming blood clots that cause a stroke if you have a medical condition called atrial fibrillation (a type of irregular heartbeat).  What do you need to know about xarelto ? Take your Xarelto ONCE DAILY at the same time every day with your evening meal. If you have difficulty swallowing the tablet whole, you may crush it and mix in applesauce just prior to taking your dose.  Take Xarelto exactly as prescribed by your doctor and DO NOT stop taking Xarelto without talking to the doctor who prescribed the medication.  Stopping without other stroke or VTE prevention medication to take the place of Xarelto may increase your risk of developing a new clot or stroke.  Refill your prescription before you run out.  After discharge, you should have regular check-up appointments with your healthcare provider that is prescribing your Xarelto.  In the future your dose may need to be changed if your kidney function or weight changes by a significant amount.  What do you do if you miss a dose? If you are taking Xarelto ONCE DAILY and you miss a dose, take it as soon as you remember on the same day then continue your regularly scheduled once daily regimen the next day. Do not take two doses of Xarelto at the same time.   Important Safety Information A possible side effect of Xarelto is bleeding. You should call your healthcare provider right away if you experience any of the following:   Bleeding from an injury or your nose that does not stop.   Unusual colored urine (red or dark brown) or  unusual colored stools (red or black).   Unusual bruising for unknown reasons.   A serious fall or if you hit your head (even if there is no bleeding).  Some medicines may interact with Xarelto and might increase your risk of bleeding while on Xarelto. To help avoid this, consult your healthcare provider or pharmacist prior to using any new prescription or non-prescription medications, including herbals, vitamins, non-steroidal anti-inflammatory drugs (NSAIDs) and supplements.  This website has more information on Xarelto: [email or web address]    Atrial Fibrillation Atrial fibrillation is a condition that causes your heart to beat irregularly. It may also cause your heart to beat faster than normal. Atrial fibrillation can prevent your heart from pumping blood normally. It increases your risk of stroke and heart problems. HOME CARE  Take medications as told by your doctor.  Only take medications that your doctor says are safe. Some medications can make the condition worse or happen again.  If blood thinners were prescribed by your doctor, take them exactly as told. Too much can cause bleeding. Too little and you will not have the needed protection against stroke and other problems.  Perform blood tests at home if told by your doctor.  Perform blood tests exactly as told by your doctor.  Do not drink alcohol.  Do not drink beverages with caffeine such as coffee, soda, and some teas.  Maintain a healthy weight.  Do not use diet pills unless your doctor says they are safe. They may  make heart problems worse.  Follow diet instructions as told by your doctor.  Exercise regularly as told by your doctor.  Keep all follow-up appointments. GET HELP RIGHT AWAY IF:   You have chest or belly (abdominal) pain.  You feel sick to your stomach (nauseous)  You suddenly have swollen feet and ankles.  You feel dizzy.  You face, arms, or legs feel numb or weak.  There is a change in your  vision or speech.  You notice a change in the speed, rhythm, or strength of your heartbeat.  You suddenly begin peeing (urinating) more often.  You get tired more easily when moving or exercising. MAKE SURE YOU:   Understand these instructions.  Will watch your condition.  Will get help right away if you are not doing well or get worse. Document Released: 03/25/2008 Document Revised: 10/11/2012 Document Reviewed: 07/27/2012 [facility] Patient Information 2014 [place].

## 2013-07-11 NOTE — CV Procedure (Addendum)
PROCEDURE NOTE  Procedure:  Transesophageal echocardiogram Operator:  Fransico Him, MD Indications:  Atrial fibrillation with RVR Complications: IV Meds:  Versed 2mg , Fentanyl 9mcg  Results: Normal LV size and function Normal RV size and function Normal trileaflet AV with trivial AR Normal MV with mild MR Normal PV with trivial PR Normal TV with trivial TR Normal RA  Moderately dilated LA with mild spontaneous echo contrast. The LA appendage had a ill defined density which was unable to determine whether it was thrombus or artifact. There was no intracardiac shunt by colorflow doppler The ascending and thoracic aorta are normal  The patient tolerated the procedure well.  Due to question of LA appendage thrombus, recommend rate control and anticoagulation for 4 weeks then plan DCCV.  His HR is adequately controlled at present.   Would stop Amiodarone due to patient's young age and and place back on Toprol that he was on prior to admission and continue Cardizem PO that was started this admission D/C in am if HR remains controlled and plan for me to see him back in the office in 1 week for followup.  Continue Xarelto and plan DCCV in 4 weeks

## 2013-07-11 NOTE — Progress Notes (Signed)
Patient placed his home cpap on tonight and tolerated well. RT will continue to monitor.

## 2013-07-11 NOTE — Progress Notes (Deleted)
  Echocardiogram 2D Echocardiogram has been performed.  DAMICHAEL, HOFMAN 07/11/2013, 5:12 PM

## 2013-07-11 NOTE — Progress Notes (Signed)
  Echocardiogram Echocardiogram Transesophageal has been performed.  JAYCUB, NOORANI 07/11/2013, 5:12 PM

## 2013-07-12 ENCOUNTER — Encounter (HOSPITAL_COMMUNITY): Payer: Self-pay | Admitting: Cardiology

## 2013-07-12 MED ORDER — METOPROLOL SUCCINATE ER 50 MG PO TB24: 50.0000 mg | ORAL_TABLET | Freq: Every day | ORAL | Status: DC

## 2013-07-12 MED ORDER — METOPROLOL SUCCINATE ER 25 MG PO TB24
25.0000 mg | ORAL_TABLET | Freq: Once | ORAL | Status: AC
  Administered 2013-07-12: 25 mg via ORAL
  Filled 2013-07-12: qty 1

## 2013-07-12 MED ORDER — RIVAROXABAN 20 MG PO TABS: 20.0000 mg | ORAL_TABLET | Freq: Every day | ORAL | Status: DC

## 2013-07-12 MED ORDER — DILTIAZEM HCL ER COATED BEADS 240 MG PO CP24: 240.0000 mg | ORAL_CAPSULE | Freq: Every day | ORAL | Status: DC

## 2013-07-12 MED ORDER — BENAZEPRIL HCL 10 MG PO TABS: 10.0000 mg | ORAL_TABLET | Freq: Every day | ORAL | Status: DC

## 2013-07-12 NOTE — Progress Notes (Signed)
    Subjective: No complaints  Objective: Vital signs in last 24 hours: Temp:  [97.8 F (36.6 C)-98.4 F (36.9 C)] 98.4 F (36.9 C) (01/13 0500) Pulse Rate:  [43-109] 71 (01/13 0500) Resp:  [16-27] 18 (01/13 0500) BP: (114-171)/(80-121) 127/90 mmHg (01/13 0500) SpO2:  [94 %-99 %] 99 % (01/13 0500) Last BM Date: 07/08/13  Intake/Output from previous day: 01/12 0701 - 01/13 0700 In: 360 [P.O.:360] Out: -  Intake/Output this shift: Total I/O In: 320 [P.O.:320] Out: -   Medications Current Facility-Administered Medications  Medication Dose Route Frequency Provider Last Rate Last Dose  . 0.9 %  sodium chloride infusion   Intravenous Continuous [name], PA-C      . acetaminophen (TYLENOL) tablet 650 mg  650 mg Oral Q4H PRN [name], PA-C      . diltiazem (CARDIZEM CD) 24 hr capsule 240 mg  240 mg Oral Daily Herminio Commons, MD   240 mg at 07/11/13 0954  . guaiFENesin-dextromethorphan (ROBITUSSIN DM) 100-10 MG/5ML syrup 5 mL  5 mL Oral Q4H PRN Satira Sark, MD   5 mL at 07/10/13 0857  . metoprolol succinate (TOPROL-XL) 24 hr tablet 25 mg  25 mg Oral Daily [name], MD   25 mg at 07/12/13 0809  . ondansetron (ZOFRAN) injection 4 mg  4 mg Intravenous Q6H PRN [name], PA-C      . Rivaroxaban (XARELTO) tablet 20 mg  20 mg Oral Q supper [name], PA-C   20 mg at 07/11/13 2214    PE: General appearance: alert, cooperative and no distress Lungs: clear to auscultation bilaterally Heart: irregularly irregular rhythm Extremities: No LEE Pulses: 2+ and symmetric Skin: Warmand dry Neurologic: Grossly normal    Assessment/Plan  Principal Problem:   Atrial fibrillation with rapid ventricular response Active Problems:   OSA (obstructive sleep apnea)   HTN (hypertension)  Plan:  Amiodarone stopped and Toprol Xl 25mg  restarted.  Also on Cardizem 240 daily and Xarelto.  HR fluctuating 100's while seated -130's-140's with ambulation.  TEE yesterday was  concerning for thrombus in the atrial appendage.  DCCV will be rescheduled for 4 weeks.  BP stable.  I think he can be DCd as he is asymptomatic. Consider further increase in toprol XL. Follow up with Dr. [name] in one week.     LOS: 4 days    HAGER, BRYAN 07/12/2013 9:09 AM  As above, patient seen and examined. He denies dyspnea or chest pain. He remains in atrial fibrillation and there was a question of left atrial appendage thrombus on transesophageal echocardiogram per Dr. [name]. Plan continue anticoagulation for 4 weeks and then proceed with cardioversion. His heart rate remains elevated. Continue Cardizem at present dose. Change Toprol to 50 mg daily. Discharge later this evening if heart rate controlled. Otherwise we may need to add digoxin as his blood pressure is borderline. Kirk Ruths

## 2013-07-12 NOTE — Care Management Note (Signed)
    Page 1 of 1   07/12/2013     3:02:49 PM   CARE MANAGEMENT NOTE 07/12/2013  Patient:  Adrian Clark, Adrian Clark   Account Number:  [id number]  Date Initiated:  07/12/2013  Documentation initiated by:  Marvetta Gibbons  Subjective/Objective Assessment:   Pt admitted afib     Action/Plan:   PTA pt lived at home   Anticipated DC Date:  07/13/2013   Anticipated DC Plan:  [place]  CM consult      Choice offered to / List presented to:             Status of service:  In process, will continue to follow Medicare Important Message given?   (If response is "NO", the following Medicare IM given date fields will be blank) Date Medicare IM given:   Date Additional Medicare IM given:    Discharge Disposition:    Per UR Regulation:  Reviewed for med. necessity/level of care/duration of stay  If discussed at [place] of Stay Meetings, dates discussed:    Comments:  07/12/13- 1445- Marvetta Gibbons RN, BSN [phone] Pt placed on Xarelto- benefits check submitted- pt may be discharged later today. In to speak with pt at bedside- per conversation pt reports that he has been on Xarelto in the past and is familiar with it. 30 day free and savings card given to pt - will need prescriptions for 30 day free with no refills plus original with refills to use with cards. Per pt will will return for cardioversion in 30 days- may or maynot cont. Xarelto at that time- will f/u with pt if benefit check comes back prior to discharge.

## 2013-07-12 NOTE — Progress Notes (Signed)
Patient ambulated in hallway with heart rates up to 110-120's after extra 25mg  Toprol.  Spoke with [name] PA, ok to discharge home.  Reviewed discharge instructions with patient and wife and they stated their understanding.  Patient instructed not to return to work until seen by MD in followup appointment or discussion with physician assistant tomorrow.  Discharged home with wife via wheelchair.  Adrian Clark

## 2013-07-13 NOTE — Discharge Summary (Signed)
Physician Discharge Summary     Patient ID: Adrian Clark MRN: [id number] DOB/AGE: [birth year] 58 y.o.  Admit date: 07/08/2013 Discharge date: 07/13/2013  Admission Diagnoses: Atrial fib with rapid RVR  Discharge Diagnoses:  Principal Problem:   Atrial fibrillation with rapid ventricular response Active Problems:   OSA (obstructive sleep apnea)   HTN (hypertension)   Discharged Condition: stable  Hospital Course:  Adrian Clark is a 58 y.o. male with a history of atrial fibrillation-with ablation two years ago, HTN and OSA. He wears a CPAP. He noticed on Wednesday that his HR was irregular and had DOE after climbing stairs more than baseline. He was seen By Dr. [name] today and an EKG revealed Afib RVR with a rate in the 140's. He was sent to the ER. He otherwise denies nausea, vomiting, fever, chest pain, orthopnea, dizziness, PND, cough, congestion, abdominal pain, hematochezia, melena, lower extremity edema, claudication.  The patient was admitted to telemetry and started on IV heparin and diltiazem.  His home amlodipone and benazapril were discontinued.  He initially became hypotensive however, BP stabilized but did not leave room for further titration of rate control medications.  He was given a bolus and started on IV amiodarone due to continued RVR.  He ultimately underwent TEE which revealed the possibililty of an atrial thrombus.  As a result DCCV was postponed.  He was started on Xarelto.  Heparin DCd.  BP improved and allowed further titration of Toprol XL and PO diltiazem.  Given the patients relatively young age, amiodarone was discontinued.  The patient was seen by Dr. Stanford Breed who felt he was stable for DC home.  He will continued on Xarelto for 4 weeks and if needed, DCCV scheduled.     Consults: None  Significant Diagnostic Studies:  TEE Results:  Normal LV size and function  Normal RV size and function  Normal trileaflet AV with trivial AR  Normal MV with mild MR    Normal PV with trivial PR  Normal TV with trivial TR  Normal RA  Moderately dilated LA with mild spontaneous echo contrast.  The LA appendage had a ill defined density which was unable to determine whether it was thrombus or artifact.  There was no intracardiac shunt by colorflow doppler  The ascending and thoracic aorta are normal  The patient tolerated the procedure well. Due to question of LA appendage thrombus, recommend rate control and anticoagulation for 4 weeks then plan DCCV. His HR is adequately controlled at present.  Would stop Amiodarone due to patient's young age and and place back on Toprol that he was on prior to admission and continue Cardizem PO that was started this admission  D/C in am if HR remains controlled and plan for me to see him back in the office in 1 week for followup. Continue Xarelto and plan DCCV in 4 weeks  Treatments: See above  Discharge Exam: Blood pressure 128/89, pulse 69, temperature 98.2 F (36.8 C), temperature source Oral, resp. rate 18, height 6\' 3"  (1.905 m), weight 250 lb 12.8 oz (113.762 kg), SpO2 96.00%.   Disposition: 01-Home or Self Care  Discharge Orders   Future Appointments Provider Department Dept Phone   07/20/2013 8:30 AM [name], MD [facility] [phone]   Future Orders Complete By Expires   Diet - low sodium heart healthy  As directed    Discharge instructions  As directed    Comments:     Activity as tolerated.  Your  follow up appointment should be within about a week.   Increase activity slowly  As directed        Medication List    STOP taking these medications       amLODipine-benazepril 10-40 MG per capsule  Commonly known as:  LOTREL     aspirin 325 MG tablet      TAKE these medications       benazepril 10 MG tablet  Commonly known as:  LOTENSIN  Take 1 tablet (10 mg total) by mouth daily.     diltiazem 240 MG 24 hr capsule  Commonly known as:  CARDIZEM CD  Take 1 capsule (240  mg total) by mouth daily.     HAWTHORN BERRY PO  Take by mouth 2 (two) times daily.     LIPOFLAVONOID Tabs  Take by mouth 2 (two) times daily.     metoprolol succinate 50 MG 24 hr tablet  Commonly known as:  TOPROL-XL  Take 1 tablet (50 mg total) by mouth daily. Take with or immediately following a meal.     Rivaroxaban 20 MG Tabs tablet  Commonly known as:  XARELTO  Take 1 tablet (20 mg total) by mouth daily with supper.           Follow-up Information   Follow up with [name], MD. (The office will call you with your follow up appt date and time)    Specialty:  Cardiology   Contact information:   [address] [place] [address] [phone]       Signed: [name] 07/13/2013, 9:37 PM

## 2013-07-14 DIAGNOSIS — I4891 Unspecified atrial fibrillation: Secondary | ICD-10-CM | POA: Diagnosis present

## 2013-07-14 NOTE — Discharge Summary (Signed)
See progress notes [name]  

## 2013-07-20 ENCOUNTER — Ambulatory Visit (INDEPENDENT_AMBULATORY_CARE_PROVIDER_SITE_OTHER): Payer: 59 | Admitting: Cardiology

## 2013-07-20 ENCOUNTER — Encounter: Payer: Self-pay | Admitting: Cardiology

## 2013-07-20 VITALS — BP 138/84 | HR 102 | Ht 74.0 in | Wt 242.0 lb

## 2013-07-20 DIAGNOSIS — I10 Essential (primary) hypertension: Secondary | ICD-10-CM

## 2013-07-20 DIAGNOSIS — G4733 Obstructive sleep apnea (adult) (pediatric): Secondary | ICD-10-CM

## 2013-07-20 DIAGNOSIS — I4891 Unspecified atrial fibrillation: Secondary | ICD-10-CM

## 2013-07-20 DIAGNOSIS — I480 Paroxysmal atrial fibrillation: Secondary | ICD-10-CM

## 2013-07-20 MED ORDER — METOPROLOL SUCCINATE ER 50 MG PO TB24: ORAL_TABLET | ORAL | Status: DC

## 2013-07-20 NOTE — Progress Notes (Signed)
[place], [place], [place]  [address] Phone: [phone] Fax:  [phone]  Date:  07/20/2013   ID:  Deantre, Bourdon 1955/12/10, MRN [id number]  PCP:  [facility]  Cardiologist:  Fransico Him, MD     History of Present Illness: Adrian Clark is a 58 y.o. male with a history of PAF s/p ablation with recurrent atrial fibrillation, OSA and HTN who presents back today for followup of his DCCV.  He was hospitalized about 2 weeks ago due to afib with RVR.  He was initially started on IV Amio for rate control and underwent TEE but there was a possible clot in LA appendage so no cardioversion was done.  His amio was stopped and he was continued on Cardizem and placed on Toprol. He was started on Xarelto.  He now presents back for followup.  He denies any chest pain or SOB.  He denies any LE edema.   Wt Readings from Last 3 Encounters:  07/20/13 242 lb (109.77 kg)  07/09/13 250 lb 12.8 oz (113.762 kg)  07/09/13 250 lb 12.8 oz (113.762 kg)     Past Medical History  Diagnosis Date  . OSA (obstructive sleep apnea)   . HTN (hypertension)   . PAF (paroxysmal atrial fibrillation) 2013    s/p ablation     Current Outpatient Prescriptions  Medication Sig Dispense Refill  . benazepril (LOTENSIN) 10 MG tablet Take 1 tablet (10 mg total) by mouth daily.  30 tablet  5  . diltiazem (CARDIZEM CD) 240 MG 24 hr capsule Take 1 capsule (240 mg total) by mouth daily.  30 capsule  5  . HAWTHORN BERRY PO Take by mouth 2 (two) times daily.      . metoprolol succinate (TOPROL-XL) 50 MG 24 hr tablet Take 1 tablet (50 mg total) by mouth daily. Take with or immediately following a meal.  30 tablet  5  . Rivaroxaban (XARELTO) 20 MG TABS tablet Take 1 tablet (20 mg total) by mouth daily with supper.  30 tablet  0  . Vitamins-Lipotropics (LIPOFLAVONOID) TABS Take by mouth 2 (two) times daily.       No current facility-administered medications for this visit.     Allergies:   No Known Allergies  Social History:  The patient  reports that he has never smoked. He does not have any smokeless tobacco history on file. He reports that he does not use illicit drugs.   Family History:  The patient's family history includes Prostate cancer in his father; Uterine cancer in his mother.   ROS:  Please see the history of present illness.      All other systems reviewed and negative.   PHYSICAL EXAM: VS:  BP 138/84  Pulse 102  Ht 6\' 2"  (1.88 m)  Wt 242 lb (109.77 kg)  BMI 31.06 kg/m2 Well nourished, well developed, in no acute distress HEENT: normal Neck: no JVD Cardiac:  normal S1, S2; RRR; no murmur Lungs:  clear to auscultation bilaterally, no wheezing, rhonchi or rales Abd: soft, nontender, no hepatomegaly Ext: no edema Skin: warm and dry Neuro:  CNs 2-12 intact, no focal abnormalities noted  EKG:  Atrial fibrillation with HR 102bpm with nonspecific ST abnormality  ASSESSMENT AND PLAN:  1. Atrial fibrillation with borderline rate control  - continue Diltiazem/Xarelto  - Increase Toprol to 75mg  daily for better rate control 2. Chronic systemic anticoagulation with Xarelto - awaiting 4 weeks on anticoagulation due  to possible LAA thrombus before DCCV. 3. OSA on CPAP  - His download today showed an AHI of 0.4/hr on 6cm H2O and excellent compliance in using more then 4 hours nightly 4. HTN - well controlled   - continue Benazepril/Toprol/Cardizem  EKG in 3 weeks and if still in afib will set up for DCCV  Signed, Fransico Him, MD 07/20/2013 9:11 AM

## 2013-07-20 NOTE — Patient Instructions (Addendum)
Your physician has recommended you make the following change in your medication:   1. Increase your Metoprolol to 75 mg daily   Your physician recommends that you schedule a follow-up appointment as needed with Dr.Turner

## 2013-07-27 ENCOUNTER — Telehealth: Payer: Self-pay | Admitting: General Surgery

## 2013-07-27 NOTE — Telephone Encounter (Signed)
Please have patient come in for EKG and BP check to make sure afib is controlled after increasing Toprol    Spoke to Pt he is scheduled for Nurse visit on the 6th. He will also send in his BP readings from the past week and a half.

## 2013-07-31 ENCOUNTER — Encounter: Payer: Self-pay | Admitting: Cardiology

## 2013-08-01 ENCOUNTER — Telehealth: Payer: Self-pay | Admitting: General Surgery

## 2013-08-01 NOTE — Telephone Encounter (Signed)
Per PT  "I believe that on Saturday evening, 07-30-2013, between 9 & 11 pm, I came back into rhythm. My pulse has stayed below 60 for most of the day on Sunday."  TO Dr [name] to make aware.

## 2013-08-01 NOTE — Telephone Encounter (Signed)
Pt is aware.  

## 2013-08-01 NOTE — Telephone Encounter (Signed)
Please have him come in for EKG to see if he is in NSR

## 2013-08-05 ENCOUNTER — Ambulatory Visit (INDEPENDENT_AMBULATORY_CARE_PROVIDER_SITE_OTHER): Payer: 59 | Admitting: *Deleted

## 2013-08-05 VITALS — BP 140/78 | HR 58 | Resp 12 | Ht 75.0 in | Wt 240.8 lb

## 2013-08-05 DIAGNOSIS — I4891 Unspecified atrial fibrillation: Secondary | ICD-10-CM

## 2013-08-05 DIAGNOSIS — I480 Paroxysmal atrial fibrillation: Secondary | ICD-10-CM

## 2013-08-05 NOTE — Progress Notes (Signed)
Pt ambulated to nurse visit room unassisted and in no apparent distress. EKG done per Dr Theodosia Blender request. Pt was in AFIb with an uncontrolled rate, HX: ablation, Toprol XL was increased to 75 mg daily. EKG reviewed and signed by DOD Dorris Carnes. NSR @ 58 bpm. EKG was taken to [facility] [place] to show Dr [name] when back in clinic. Pt will need f/u app and was told Dr [name] will review and advise care plan. Pt returned to lobby without incident.

## 2013-08-08 ENCOUNTER — Encounter: Payer: Self-pay | Admitting: Cardiology

## 2013-08-09 ENCOUNTER — Telehealth: Payer: Self-pay | Admitting: Cardiology

## 2013-08-09 ENCOUNTER — Telehealth: Payer: Self-pay | Admitting: General Surgery

## 2013-08-09 NOTE — Telephone Encounter (Signed)
Per Pt "During the last week, I have had a watch that records my heart rate by the minute. I have had 3652 readings (minutes) with the pulse under 50. I had 6 readings of 37 heart rate and 26 readings of 38 heart rate. My resting heart rate is usually in the low 50's. Should I drop back to 50 mg from the 75 mg Metoprolol ER Succinate? The max during that time was 162 when I was exercising."  To Dr [name] to advise.

## 2013-08-09 NOTE — Telephone Encounter (Signed)
See telephone encounter.

## 2013-08-09 NOTE — Telephone Encounter (Signed)
-----   Message ----- From: [name] Sent: 08/08/2013 9:38 PM To: [facility] Triage Subject: Non-Urgent Medical Question During the last week, I have had a watch that records my heart rate by the minute. I have had 3652 readings (minutes) with the pulse under 50. I had 6 readings of 37 heart rate and 26 readings of 38 heart rate. My resting heart rate is usually in the low 50's. Should I drop back to 50 mg from the 75 mg Metoprolol ER Succinate? The max during that time was 162 when I was exercising.   Please have patient decrease Toprol XL to 50mg  daily and get a 24 hour Holter to assess for average HR.  He is in NSR by recent EKG.

## 2013-08-10 ENCOUNTER — Other Ambulatory Visit: Payer: Self-pay | Admitting: *Deleted

## 2013-08-10 DIAGNOSIS — R001 Bradycardia, unspecified: Secondary | ICD-10-CM

## 2013-08-10 MED ORDER — METOPROLOL SUCCINATE ER 50 MG PO TB24: ORAL_TABLET | ORAL | Status: DC

## 2013-08-10 NOTE — Addendum Note (Signed)
Addended byUlla Potash H on: 08/10/2013 01:07 PM   Modules accepted: Orders

## 2013-08-10 NOTE — Telephone Encounter (Signed)
To [facility].

## 2013-08-10 NOTE — Telephone Encounter (Signed)
Advised pt to decrease Toprol dose to 50mg  daily. Discussed necessity of holter monitor and informed him someone should call to schedule it (will send to [facility] to schedule). Patient verbalized understanding and agreeable to plan.

## 2013-08-11 ENCOUNTER — Encounter: Payer: Self-pay | Admitting: Cardiology

## 2013-08-12 ENCOUNTER — Encounter: Payer: Self-pay | Admitting: Cardiology

## 2013-08-15 NOTE — Telephone Encounter (Signed)
Spoke with patient.  For the past 3 days he feels his BP has been more stable.  Checks it several times daily.  120s/80s with HR in 50s resting.  Feels more comfortable with stable VS.  Does not think he needs appointment at this time. Has appointment for holter this Friday.

## 2013-08-19 ENCOUNTER — Encounter (INDEPENDENT_AMBULATORY_CARE_PROVIDER_SITE_OTHER): Payer: 59

## 2013-08-19 ENCOUNTER — Encounter: Payer: Self-pay | Admitting: Cardiology

## 2013-08-19 ENCOUNTER — Encounter: Payer: Self-pay | Admitting: Radiology

## 2013-08-19 DIAGNOSIS — I498 Other specified cardiac arrhythmias: Secondary | ICD-10-CM

## 2013-08-19 DIAGNOSIS — R001 Bradycardia, unspecified: Secondary | ICD-10-CM

## 2013-08-19 NOTE — Progress Notes (Signed)
Patient ID: Adrian Clark, male   DOB: 11-Apr-1956, 58 y.o.   MRN: [id number] Evo 48 hr holter monitor applied

## 2013-08-24 ENCOUNTER — Encounter: Payer: Self-pay | Admitting: Cardiology

## 2013-08-26 ENCOUNTER — Other Ambulatory Visit: Payer: Self-pay | Admitting: General Surgery

## 2013-08-26 ENCOUNTER — Telehealth: Payer: Self-pay | Admitting: Cardiology

## 2013-08-26 ENCOUNTER — Encounter: Payer: Self-pay | Admitting: *Deleted

## 2013-08-26 DIAGNOSIS — R001 Bradycardia, unspecified: Secondary | ICD-10-CM

## 2013-08-26 MED ORDER — DILTIAZEM HCL ER COATED BEADS 120 MG PO CP24: 120.0000 mg | ORAL_CAPSULE | Freq: Every day | ORAL | Status: DC

## 2013-08-26 NOTE — Telephone Encounter (Signed)
Pt is aware and we put order in and changed med list and new rx sent in. Pt asked to have his monitor mailed to his home instead of driving from [place].

## 2013-08-26 NOTE — Progress Notes (Signed)
Patient ID: Adrian Clark, male   DOB: 1956/03/13, 58 y.o.   MRN: [id number] Patient enrolled for [employer] to mail a 30 day cardiac event monitor to his home.  [employer] notified approx. Start day 09/02/2013.

## 2013-08-26 NOTE — Telephone Encounter (Signed)
Please let patient know that heart monitor showed sinus bradycardia with average HR 51bpm and max HR 110bpm.  There were occasional PAC's up to 3 in a row but no afib.  He had occasional sinus pauses up to 2 seconds with junctional escape beats.  No symptoms documented

## 2013-08-26 NOTE — Telephone Encounter (Signed)
Please have him decrease Cardizem down to 120mg  daily and place an event monitor to follow for resolution of bradycardia and any PAF

## 2013-08-31 ENCOUNTER — Encounter: Payer: Self-pay | Admitting: Cardiology

## 2013-08-31 ENCOUNTER — Telehealth: Payer: Self-pay | Admitting: General Surgery

## 2013-08-31 NOTE — Telephone Encounter (Signed)
I dropped the Diltiazem to 120mg  beginning Saturday as you requested. My BP is still high. My heart rate has come up a little bit. This morning, my BP was 172/100 before my meds and 155/103 @ 11:07 ,5 hours after my meds. My pulse was 52 and 56 respectively. I have been on a very low salt diet and 85% vegetable and fruit diet with no beef for 2+ months. What am I missing?  This came as a email for pt. To Dr [name] to review.

## 2013-09-01 ENCOUNTER — Encounter: Payer: Self-pay | Admitting: Cardiology

## 2013-09-01 MED ORDER — BENAZEPRIL HCL 20 MG PO TABS: 20.0000 mg | ORAL_TABLET | Freq: Every day | ORAL | Status: DC

## 2013-09-01 NOTE — Addendum Note (Signed)
Addended by: Lily Kocher on: 09/01/2013 12:43 PM   Modules accepted: Orders, Medications

## 2013-09-01 NOTE — Telephone Encounter (Signed)
Pt is aware. Med list updated for pt.

## 2013-09-01 NOTE — Telephone Encounter (Signed)
Please have him stop Diltiazem and increase Benazepril to 20mg  daily and check BP and HR daily for a week and call with results

## 2013-09-06 ENCOUNTER — Encounter: Payer: Self-pay | Admitting: Cardiology

## 2013-09-06 DIAGNOSIS — I10 Essential (primary) hypertension: Secondary | ICD-10-CM

## 2013-09-06 DIAGNOSIS — Z79899 Other long term (current) drug therapy: Secondary | ICD-10-CM

## 2013-09-06 MED ORDER — BENAZEPRIL HCL 40 MG PO TABS: 40.0000 mg | ORAL_TABLET | Freq: Every day | ORAL | Status: DC

## 2013-09-06 NOTE — Telephone Encounter (Signed)
Please have patient increase Benazepril to 40mg  daily and check BP daily for a week and then nurse visit in one week for BP followup. Have him bring his BP readings along. Also needs BMET at time of nurse visit  Left message for pt to call back to discuss medication change, daily BP's, one week appt for BP check and blood work

## 2013-09-06 NOTE — Telephone Encounter (Signed)
Patient is returning your call, please call back he has his phone in his hand.

## 2013-09-07 ENCOUNTER — Encounter: Payer: Self-pay | Admitting: Cardiology

## 2013-09-07 ENCOUNTER — Telehealth: Payer: Self-pay | Admitting: General Surgery

## 2013-09-07 NOTE — Telephone Encounter (Signed)
Please have patient come in to see PA or NP today or tomorrow

## 2013-09-07 NOTE — Telephone Encounter (Signed)
"  I doubled the Benzepril to 20mg /day and stopped taking the Diltiazem as suggested beginning this past Sat. My BP and pulse readings from today are 169/91/50 6:00 and then 186/112/54 9:10am, 209/108/54 9:15 and 174/112/57 11:02. When should I be worried my BP? Is there anything else I can do to get the BP down. I have avoided salt, eaten veg with chicken, [place] or fish and exercising more." Per Pt.   To Dr [name] to advise for pt.

## 2013-09-07 NOTE — Telephone Encounter (Signed)
Neither Scott or Cecille Rubin have any availability on Thursday 3/12. What can I do for this pt.?

## 2013-09-08 ENCOUNTER — Encounter: Payer: Self-pay | Admitting: General Surgery

## 2013-09-08 NOTE — Telephone Encounter (Signed)
Discussed with Andee Poles and she will follow up with patient

## 2013-09-08 NOTE — Telephone Encounter (Signed)
Pt scheduled to see Dr [name] at 8:30 On Monday. Pt also has labs rescheduled to that day.

## 2013-09-12 ENCOUNTER — Ambulatory Visit (INDEPENDENT_AMBULATORY_CARE_PROVIDER_SITE_OTHER): Payer: 59 | Admitting: Cardiology

## 2013-09-12 ENCOUNTER — Encounter: Payer: Self-pay | Admitting: Cardiology

## 2013-09-12 ENCOUNTER — Other Ambulatory Visit: Payer: 59

## 2013-09-12 VITALS — BP 144/96 | HR 56 | Ht 75.0 in | Wt 238.0 lb

## 2013-09-12 DIAGNOSIS — I4891 Unspecified atrial fibrillation: Secondary | ICD-10-CM

## 2013-09-12 DIAGNOSIS — I480 Paroxysmal atrial fibrillation: Secondary | ICD-10-CM

## 2013-09-12 DIAGNOSIS — G4733 Obstructive sleep apnea (adult) (pediatric): Secondary | ICD-10-CM

## 2013-09-12 DIAGNOSIS — I10 Essential (primary) hypertension: Secondary | ICD-10-CM

## 2013-09-12 MED ORDER — AMLODIPINE BESYLATE 5 MG PO TABS: 5.0000 mg | ORAL_TABLET | Freq: Every day | ORAL | Status: DC

## 2013-09-12 NOTE — Progress Notes (Addendum)
[place], [place], [place]  [address] Phone: [phone] Fax:  [phone]  Date:  09/12/2013   ID:  Sutter, Ahlgren 1955-08-14, MRN [id number]  PCP:  [facility]  Cardiologist:  Fransico Him, MD   History of Present Illness: Adrian Clark is a 58 y.o. male with a history of PAF s/p ablation with recurrent atrial fibrillation, OSA and HTN who presents back today for followup of his HTN. He was a while back due to afib with RVR. He was initially started on IV Amio for rate control and underwent TEE but there was a possible clot in LA appendage so no cardioversion was done. His amio was stopped and he was continued on Cardizem and placed on Toprol. He was started on Xarelto. He converted spontaneously to NSR.  Since then he has had some problems with bradycardia and his Diltiazem was stopped and Benazepril was increased for BP control. He now presents back today for followup.   Wt Readings from Last 3 Encounters:  08/05/13 240 lb 12.8 oz (109.226 kg)  07/20/13 242 lb (109.77 kg)  07/09/13 250 lb 12.8 oz (113.762 kg)     Past Medical History  Diagnosis Date  . OSA (obstructive sleep apnea)   . HTN (hypertension)   . PAF (paroxysmal atrial fibrillation) 2013    s/p ablation     Current Outpatient Prescriptions  Medication Sig Dispense Refill  . benazepril (LOTENSIN) 40 MG tablet Take 1 tablet (40 mg total) by mouth daily.  30 tablet  5  . HAWTHORN BERRY PO Take by mouth 2 (two) times daily.      . metoprolol succinate (TOPROL-XL) 50 MG 24 hr tablet 1 tablet by mouth daily  45 tablet  6  . Rivaroxaban (XARELTO) 20 MG TABS tablet Take 1 tablet (20 mg total) by mouth daily with supper.  30 tablet  0  . Vitamins-Lipotropics (LIPOFLAVONOID) TABS Take by mouth 2 (two) times daily.       No current facility-administered medications for this visit.    Allergies:   No Known Allergies  Social History:  The patient  reports that he has  never smoked. He does not have any smokeless tobacco history on file. He reports that he does not use illicit drugs.   Family History:  The patient's family history includes Prostate cancer in his father; Uterine cancer in his mother.   ROS:  Please see the history of present illness.      All other systems reviewed and negative.   PHYSICAL EXAM: VS:  There were no vitals taken for this visit. Well nourished, well developed, in no acute distress HEENT: normal Neck: no JVD Cardiac:  normal S1, S2; RRR; no murmur Lungs:  clear to auscultation bilaterally, no wheezing, rhonchi or rales Abd: soft, nontender, no hepatomegaly Ext: no edema Skin: warm and dry Neuro:  CNs 2-12 intact, no focal abnormalities noted  ASSESSMENT AND PLAN:  1.  Atrial fibrillation with borderline rate control - continue Metoprolol/Xarelto  - he is going to wear a lifewatch heart monitor to assess for silent PAF and if normal will stop his Xarelto (CHADs2Vasc score is 1) 2.  Chronic systemic anticoagulation with Xarelto  3.  OSA on CPAP 4.  HTN - still elevated - continue Benazepril/Toprol - add amlodipine 5mg  daily - he will check his BP daily for and week and call  Followup with me in 6 months  Signed, Fransico Him, MD 09/12/2013 8:41 AM

## 2013-09-12 NOTE — Patient Instructions (Signed)
Your physician has recommended you make the following change in your medication: 1. Start Amlodipine 5 MG 1 tablet daily  Your physician has requested that you regularly monitor and record your blood pressure readings at home. Please use the same machine at the same time of day to check your readings and record them starting Weds for one week and call us with your results at the end of the week.  Your physician wants you to follow-up in: 6 Months with Dr. Mallie Snooks will receive a reminder letter in the mail two months in advance. If you don't receive a letter, please call our office to schedule the follow-up appointment.

## 2013-09-14 ENCOUNTER — Other Ambulatory Visit: Payer: 59

## 2013-09-14 ENCOUNTER — Encounter (INDEPENDENT_AMBULATORY_CARE_PROVIDER_SITE_OTHER): Payer: 59

## 2013-09-14 DIAGNOSIS — I4891 Unspecified atrial fibrillation: Secondary | ICD-10-CM

## 2013-09-14 DIAGNOSIS — R001 Bradycardia, unspecified: Secondary | ICD-10-CM

## 2013-09-15 ENCOUNTER — Encounter: Payer: Self-pay | Admitting: Cardiology

## 2013-09-19 ENCOUNTER — Encounter: Payer: Self-pay | Admitting: Cardiology

## 2013-09-19 ENCOUNTER — Telehealth: Payer: Self-pay | Admitting: General Surgery

## 2013-09-19 ENCOUNTER — Other Ambulatory Visit: Payer: 59

## 2013-09-19 NOTE — Telephone Encounter (Signed)
My BP Readings from Friday through today are: Fri 172/10269 Sat 151/96/50 Sun 140/89/62 Mon 157/92/59 & 62/96/64. The LifeWatch is not working for me. The first day I put it on, it beeped most all night. During the next day there were no triggers. The next night it started beeping. Each time I down loaded the readings, the technician said there was nothing to report to the doctor.See Next Note (PER PT)

## 2013-10-06 ENCOUNTER — Other Ambulatory Visit: Payer: Self-pay | Admitting: General Surgery

## 2013-10-06 ENCOUNTER — Encounter: Payer: Self-pay | Admitting: Cardiology

## 2013-10-06 ENCOUNTER — Telehealth: Payer: Self-pay | Admitting: Cardiology

## 2013-10-06 MED ORDER — AMLODIPINE BESY-BENAZEPRIL HCL 10-40 MG PO CAPS: 1.0000 | ORAL_CAPSULE | Freq: Every day | ORAL | Status: DC

## 2013-10-06 MED ORDER — ASPIRIN EC 81 MG PO TBEC: 81.0000 mg | DELAYED_RELEASE_TABLET | Freq: Every day | ORAL | Status: DC

## 2013-10-06 NOTE — Telephone Encounter (Signed)
Please let patient know that he did not have any further afib on heart monitor so ok to discontinue Xarelto

## 2013-10-06 NOTE — Telephone Encounter (Signed)
Please let patient know that heart monitor showed sinus bradycardia to NSR from 48-70bpm.  Continue current meds

## 2013-10-06 NOTE — Telephone Encounter (Signed)
Pt is aware of med changes and new rx called in for pt. Medication list updated as well.

## 2013-10-06 NOTE — Telephone Encounter (Signed)
Pt had left over amlodipine-benazepril 10-40 and he has been taking that instead of his amlodipine and his benazepril. He wants to continue with this instead of the two different pills. He wants a 90 day supply of that sent in bc his BP have been 120's/80's and pulse rate has been great.   Pt is aware to stop Xarelto.

## 2013-10-06 NOTE — Telephone Encounter (Signed)
Please make sure patient starts a baby ASA 81mg  daily

## 2013-10-06 NOTE — Telephone Encounter (Signed)
Pt is aware. Added to med list for pt.

## 2013-10-06 NOTE — Telephone Encounter (Signed)
Pt is aware.  

## 2013-10-20 ENCOUNTER — Telehealth: Payer: Self-pay | Admitting: Cardiology

## 2013-10-20 NOTE — Telephone Encounter (Signed)
I spoke with him on the phone and he was aware to start ASA 81 MG daily

## 2013-10-20 NOTE — Telephone Encounter (Signed)
Patient called in recently about starting ASA since stopping Xarelto but it does not appear that he has read it online.  Please call him and make sure he started ASA 81mg  daily after stopping Xarelto

## 2013-12-14 ENCOUNTER — Encounter: Payer: Self-pay | Admitting: Cardiology

## 2013-12-19 ENCOUNTER — Telehealth: Payer: Self-pay | Admitting: General Surgery

## 2013-12-19 ENCOUNTER — Encounter: Payer: Self-pay | Admitting: Cardiology

## 2013-12-19 NOTE — Telephone Encounter (Signed)
To Dr [name] as a Juluis Rainier

## 2013-12-21 ENCOUNTER — Ambulatory Visit (INDEPENDENT_AMBULATORY_CARE_PROVIDER_SITE_OTHER): Payer: 59 | Admitting: Cardiology

## 2013-12-21 ENCOUNTER — Encounter: Payer: Self-pay | Admitting: Cardiology

## 2013-12-21 VITALS — BP 142/84 | HR 64 | Ht 75.0 in | Wt 225.0 lb

## 2013-12-21 DIAGNOSIS — G4733 Obstructive sleep apnea (adult) (pediatric): Secondary | ICD-10-CM

## 2013-12-21 DIAGNOSIS — I4891 Unspecified atrial fibrillation: Secondary | ICD-10-CM

## 2013-12-21 DIAGNOSIS — I10 Essential (primary) hypertension: Secondary | ICD-10-CM

## 2013-12-21 DIAGNOSIS — I480 Paroxysmal atrial fibrillation: Secondary | ICD-10-CM

## 2013-12-21 NOTE — Patient Instructions (Signed)
Your physician recommends that you continue on your current medications as directed. Please refer to the Current Medication list given to you today.  Your physician wants you to follow-up in: 6 months with Dr Turner You will receive a reminder letter in the mail two months in advance. If you don't receive a letter, please call our office to schedule the follow-up appointment.  

## 2013-12-21 NOTE — Progress Notes (Signed)
  [name], [name], [name]  [address] Phone: [phone] Fax:  [phone]  Date:  12/21/2013   ID:  Adrian Clark, Adrian Clark [birth year], MRN [id number]  PCP:  [facility]  Cardiologist:  Fransico Him, MD     History of Present Illness: Adrian Clark is a 58 y.o. male with a history of PAF s/p ablation with recurrent atrial fibrillation, OSA and HTN who presents back today for followup of his HTN.  He has lost 30 pounds dieting and exercising.  He denies any chest pain, SOB, DOE, LE edema, dizziness, or syncope.  Rarely he will notice a very quick flutter.  He is doing well with his CPAP therapy and tolerates his device well without any problems.    Wt Readings from Last 3 Encounters:  12/21/13 225 lb (102.059 kg)  09/12/13 238 lb (107.956 kg)  08/05/13 240 lb 12.8 oz (109.226 kg)     Past Medical History  Diagnosis Date  . OSA (obstructive sleep apnea)   . HTN (hypertension)   . PAF (paroxysmal atrial fibrillation) 2013    s/p ablation     Current Outpatient Prescriptions  Medication Sig Dispense Refill  . amLODipine-benazepril (LOTREL) 10-40 MG per capsule Take 1 capsule by mouth daily.  90 capsule  3  . aspirin EC 81 MG tablet Take 1 tablet (81 mg total) by mouth daily.      . metoprolol succinate (TOPROL-XL) 50 MG 24 hr tablet 1 tablet by mouth daily  45 tablet  6   No current facility-administered medications for this visit.    Allergies:   No Known Allergies  Social History:  The patient  reports that he has never smoked. He does not have any smokeless tobacco history on file. He reports that he does not use illicit drugs.   Family History:  The patient's family history includes Prostate cancer in his father; Uterine cancer in his mother.   ROS:  Please see the history of present illness.      All other systems reviewed and negative.   PHYSICAL EXAM: VS:  BP 142/84  Pulse 64  Ht 6\' 3"  (1.905 m)  Wt 225 lb (102.059 kg)   BMI 28.12 kg/m2 Well nourished, well developed, in no acute distress HEENT: normal Neck: no JVD Cardiac:  normal S1, S2; RRR; no murmur Lungs:  clear to auscultation bilaterally, no wheezing, rhonchi or rales Abd: soft, nontender, no hepatomegaly Ext: no edema Skin: warm and dry Neuro:  CNs 2-12 intact, no focal abnormalities noted      ASSESSMENT AND PLAN:  1. Atrial fibrillation - maintaining NSR.  CHADS2VASC score is 1 (HTN). - continue Toprol/ASA 2. OSA on CPAP - his download today showed an AHI of 0.1/hr on 6cm H2O and 98% compliance in using more than 4 hours nightly. 3. HTN - still elevated  - continue Toprol and Lotrel  Followup with me in 6 months    Signed, Fransico Him, MD 12/21/2013 8:31 AM

## 2014-03-17 ENCOUNTER — Encounter: Payer: Self-pay | Admitting: Cardiology

## 2014-04-07 ENCOUNTER — Encounter: Payer: Self-pay | Admitting: Cardiology

## 2014-05-24 ENCOUNTER — Encounter: Payer: Self-pay | Admitting: Cardiology

## 2014-06-09 ENCOUNTER — Telehealth: Payer: Self-pay | Admitting: Cardiology

## 2014-06-09 NOTE — Telephone Encounter (Signed)
I spoke with the patient and made him aware he should be able to get a flu shot when here next week as long as we have them.

## 2014-06-09 NOTE — Telephone Encounter (Signed)
New Msg   Pt wants to get flu shot when he comes for appt on 12/16 and would like to know if that is ok. [phone].

## 2014-06-14 ENCOUNTER — Encounter: Payer: Self-pay | Admitting: Cardiology

## 2014-06-14 ENCOUNTER — Ambulatory Visit (INDEPENDENT_AMBULATORY_CARE_PROVIDER_SITE_OTHER): Payer: BC Managed Care – PPO | Admitting: Cardiology

## 2014-06-14 ENCOUNTER — Ambulatory Visit (INDEPENDENT_AMBULATORY_CARE_PROVIDER_SITE_OTHER): Payer: BC Managed Care – PPO | Admitting: *Deleted

## 2014-06-14 VITALS — BP 140/98 | HR 62 | Ht 75.0 in | Wt 239.2 lb

## 2014-06-14 DIAGNOSIS — I10 Essential (primary) hypertension: Secondary | ICD-10-CM

## 2014-06-14 DIAGNOSIS — I480 Paroxysmal atrial fibrillation: Secondary | ICD-10-CM

## 2014-06-14 DIAGNOSIS — G4733 Obstructive sleep apnea (adult) (pediatric): Secondary | ICD-10-CM

## 2014-06-14 DIAGNOSIS — Z23 Encounter for immunization: Secondary | ICD-10-CM

## 2014-06-14 NOTE — Patient Instructions (Addendum)
Your physician recommends that you schedule a follow-up appointment in 6 months with Dr. [name].  Check blood pressure daily for 1 week and call us with results.

## 2014-06-14 NOTE — Progress Notes (Signed)
  [place], [place],   [address] Phone: [phone] Fax:  [phone]  Date:  06/14/2014   ID:  Roye, Gustafson Sep 15, 1955, MRN [id number]  PCP:  [facility]  Cardiologist:  Fransico Him, MD    History of Present Illness:  Adrian Clark is a 58 y.o. male with a history of PAF s/p ablation with recurrent atrial fibrillation, OSA and HTN who presents back today for followup. He denies any chest pain, SOB, DOE, LE edema, dizziness, or syncope. Rarely he will notice a very quick flutter. He is doing well with his CPAP therapy and tolerates his device well without any problems. He feels rested in the am and has no daytime sleepiness.  Wt Readings from Last 3 Encounters:  06/14/14 239 lb 3.2 oz (108.5 kg)  12/21/13 225 lb (102.059 kg)  09/12/13 238 lb (107.956 kg)     Past Medical History  Diagnosis Date  . OSA (obstructive sleep apnea)   . HTN (hypertension)   . PAF (paroxysmal atrial fibrillation) 2013    s/p ablation     Current Outpatient Prescriptions  Medication Sig Dispense Refill  . amLODipine-benazepril (LOTREL) 10-40 MG per capsule Take 1 capsule by mouth daily. 90 capsule 3  . aspirin EC 81 MG tablet Take 1 tablet (81 mg total) by mouth daily.    . metoprolol succinate (TOPROL-XL) 50 MG 24 hr tablet 1 tablet by mouth daily (Patient taking differently: Take 50 mg by mouth daily. 1 tablet by mouth daily) 45 tablet 6   No current facility-administered medications for this visit.    Allergies:   No Known Allergies  Social History:  The patient  reports that he has never smoked. He does not have any smokeless tobacco history on file. He reports that he does not use illicit drugs.   Family History:  The patient's family history includes Prostate cancer in his father; Uterine cancer in his mother.   ROS:  Please see the history of present illness.      All other systems reviewed and negative.   PHYSICAL EXAM: VS:   BP 140/98 mmHg  Pulse 62  Ht 6\' 3"  (1.905 m)  Wt 239 lb 3.2 oz (108.5 kg)  BMI 29.90 kg/m2  SpO2 95% Well nourished, well developed, in no acute distress HEENT: normal Neck: no JVD Cardiac:  normal S1, S2; RRR; no murmur Lungs:  clear to auscultation bilaterally, no wheezing, rhonchi or rales Abd: soft, nontender, no hepatomegaly Ext: no edema Skin: warm and dry Neuro:  CNs 2-12 intact, no focal abnormalities noted  EKG:     NSR with no ST changes  ASSESSMENT AND PLAN:  1. Atrial fibrillation - maintaining NSR. CHADS2VASC score is 1 (HTN). - continue Toprol/ASA 2. OSA on CPAP - his download today showed an AHI of 0.2 /hr on 6cm H2O and 97% compliance in using more than 4 hours nightly. 3. HTN - elevated today.  He does not use table salt.   - continue Toprol and Lotrel - I have asked him to check his BP daily for a week and call with results  Followup with me in 6 months   Signed, Fransico Him, MD [facility] 06/14/2014 8:53 AM

## 2014-06-15 ENCOUNTER — Other Ambulatory Visit: Payer: Self-pay | Admitting: Cardiology

## 2014-06-21 ENCOUNTER — Telehealth: Payer: Self-pay

## 2014-06-21 ENCOUNTER — Encounter: Payer: Self-pay | Admitting: Cardiology

## 2014-06-21 NOTE — Telephone Encounter (Signed)
Advice request forwarded from patient:    ----- Message -----     From: [name]     Sent: 06/21/2014  9:33 AM      To: Cv Div Ch St Triage    Subject: Visit Follow-Up Question                   My BP Readings    138/88    147/84    136/84    137/90    134/87        I only got 5 readings.     If I need a medication change please have your nurse call.     Thanks    Adrian Clark    To Dr. [name] for review.

## 2014-06-21 NOTE — Telephone Encounter (Signed)
Per Dr. [name], BP is fine.

## 2014-07-14 ENCOUNTER — Other Ambulatory Visit: Payer: Self-pay | Admitting: Cardiology

## 2014-09-08 ENCOUNTER — Encounter: Payer: Self-pay | Admitting: Cardiology

## 2014-09-26 ENCOUNTER — Other Ambulatory Visit: Payer: Self-pay | Admitting: Cardiology

## 2014-11-30 ENCOUNTER — Other Ambulatory Visit: Payer: Self-pay | Admitting: Cardiology

## 2014-12-17 ENCOUNTER — Other Ambulatory Visit: Payer: Self-pay | Admitting: Cardiology

## 2014-12-18 ENCOUNTER — Other Ambulatory Visit: Payer: Self-pay

## 2014-12-18 MED ORDER — AMLODIPINE BESY-BENAZEPRIL HCL 10-40 MG PO CAPS: ORAL_CAPSULE | ORAL | Status: DC

## 2015-01-12 ENCOUNTER — Ambulatory Visit (INDEPENDENT_AMBULATORY_CARE_PROVIDER_SITE_OTHER): Payer: BLUE CROSS/BLUE SHIELD | Admitting: Cardiology

## 2015-01-12 ENCOUNTER — Encounter: Payer: Self-pay | Admitting: Cardiology

## 2015-01-12 VITALS — BP 128/84 | HR 72 | Ht 74.0 in | Wt 242.0 lb

## 2015-01-12 DIAGNOSIS — I480 Paroxysmal atrial fibrillation: Secondary | ICD-10-CM

## 2015-01-12 DIAGNOSIS — I10 Essential (primary) hypertension: Secondary | ICD-10-CM

## 2015-01-12 DIAGNOSIS — G4733 Obstructive sleep apnea (adult) (pediatric): Secondary | ICD-10-CM

## 2015-01-12 NOTE — Progress Notes (Signed)
Cardiology Office Note   Date:  01/12/2015   ID:  Adrian, Clark 20-Apr-1956, MRN [id number]  PCP:  [facility]    Chief Complaint  Patient presents with  . Sleep Apnea  . Hypertension  . Atrial Fibrillation      History of Present Illness: Adrian Clark is a 59 y.o. male with a history of PAF s/p ablation with recurrent atrial fibrillation, OSA and HTN who presents back today for followup. He denies any chest pain, SOB, DOE, LE edema, dizziness, or syncope. Rarely he will notice a very quick flutter. He is doing well with his CPAP therapy and tolerates his device well without any problems. He feels rested in the am and has no daytime sleepiness.  He has had a cold recently with some sinus congestion.  He tolerates the nasal pillow mask which he tolerates well and does not snore.      Past Medical History  Diagnosis Date  . OSA (obstructive sleep apnea)   . HTN (hypertension)   . PAF (paroxysmal atrial fibrillation) 2013    s/p ablation     Past Surgical History  Procedure Laterality Date  . Cholecystectomy    . Tonsillectomy    . Vasectomy    . Tee without cardioversion N/A 07/11/2013    Procedure: TRANSESOPHAGEAL ECHOCARDIOGRAM (TEE);  Surgeon: [name], MD;  Location: [facility] ENDOSCOPY;  Service: Cardiovascular;  Laterality: N/A;  . Cardioversion N/A 07/11/2013    Procedure: CARDIOVERSION;  Surgeon: [name], MD;  Location: [facility] ENDOSCOPY;  Service: Cardiovascular;  Laterality: N/A;  . Ablation      AFIB     Current Outpatient Prescriptions  Medication Sig Dispense Refill  . amLODipine-benazepril (LOTREL) 10-40 MG per capsule TAKE 1 CAPSULE BY MOUTH EVERY DAY 90 capsule 3  . aspirin EC 81 MG tablet Take 1 tablet (81 mg total) by mouth daily.    . metoprolol succinate (TOPROL-XL) 50 MG 24 hr tablet TAKE 1 AND 1/2 TABLET BY MOUTH EVERY DAY 45 tablet 0   No current facility-administered medications for this  visit.    Allergies:   Review of patient's allergies indicates no known allergies.    Social History:  The patient  reports that he has never smoked. He does not have any smokeless tobacco history on file. He reports that he does not use illicit drugs.   Family History:  The patient's family history includes Prostate cancer in his father; Uterine cancer in his mother.    ROS:  Please see the history of present illness.   Otherwise, review of systems are positive for none.   All other systems are reviewed and negative.    PHYSICAL EXAM: VS:  BP 128/84 mmHg  Pulse 72  Ht 6\' 2"  (1.88 m)  Wt 242 lb (109.77 kg)  BMI 31.06 kg/m2 , BMI Body mass index is 31.06 kg/(m^2). GEN: Well nourished, well developed, in no acute distress HEENT: normal Neck: no JVD, carotid bruits, or masses Cardiac: RRR; no murmurs, rubs, or gallops,no edema  Respiratory:  clear to auscultation bilaterally, normal work of breathing GI: soft, nontender, nondistended, + BS MS: no deformity or atrophy Skin: warm and dry, no rash Neuro:  Strength and sensation are intact Psych: euthymic mood, full affect   EKG:  EKG is not ordered today.    Recent Labs: No results found for requested  labs within last 365 days.    Lipid Panel    Component Value Date/Time   CHOL 159 07/09/2013 0430   TRIG 89 07/09/2013 0430   HDL 42 07/09/2013 0430   CHOLHDL 3.8 07/09/2013 0430   VLDL 18 07/09/2013 0430   LDLCALC 99 07/09/2013 0430      Wt Readings from Last 3 Encounters:  01/12/15 242 lb (109.77 kg)  06/14/14 239 lb 3.2 oz (108.5 kg)  12/21/13 225 lb (102.059 kg)     ASSESSMENT AND PLAN:  1. Atrial fibrillation - maintaining NSR. CHADS2VASC score is 1 (HTN). - continue Toprol/ASA 2. OSA on CPAP - he tolerates his CPAP.  I will get a d/l from his DME.   3. HTN - controlled today.  - continue Toprol and Lotrel   Current medicines are reviewed at length with the patient today.  The patient does not have  concerns regarding medicines.  The following changes have been made:  no change  Labs/ tests ordered today: See above Assessment and Plan No orders of the defined types were placed in this encounter.     Disposition:   FU with me in 6 months  Signed, [name], MD  01/12/2015 3:08 PM    [facility] [place], [place], [place]  [address] Phone: [phone]; Fax: [phone]

## 2015-01-12 NOTE — Patient Instructions (Signed)

## 2015-01-24 ENCOUNTER — Other Ambulatory Visit: Payer: Self-pay | Admitting: Cardiology

## 2015-05-03 ENCOUNTER — Encounter: Payer: Self-pay | Admitting: Cardiology

## 2015-07-06 ENCOUNTER — Other Ambulatory Visit: Payer: Self-pay | Admitting: Cardiology

## 2015-07-09 ENCOUNTER — Encounter: Payer: Self-pay | Admitting: Cardiology

## 2015-07-12 ENCOUNTER — Encounter: Payer: Self-pay | Admitting: Cardiology

## 2015-07-12 ENCOUNTER — Ambulatory Visit (INDEPENDENT_AMBULATORY_CARE_PROVIDER_SITE_OTHER): Payer: BLUE CROSS/BLUE SHIELD | Admitting: Cardiology

## 2015-07-12 VITALS — BP 138/92 | HR 95 | Ht 74.0 in | Wt 248.0 lb

## 2015-07-12 DIAGNOSIS — I10 Essential (primary) hypertension: Secondary | ICD-10-CM | POA: Diagnosis not present

## 2015-07-12 DIAGNOSIS — I480 Paroxysmal atrial fibrillation: Secondary | ICD-10-CM | POA: Diagnosis not present

## 2015-07-12 DIAGNOSIS — G4733 Obstructive sleep apnea (adult) (pediatric): Secondary | ICD-10-CM

## 2015-07-12 LAB — CBC WITH DIFFERENTIAL/PLATELET
Basophils Absolute: 0 10*3/uL (ref 0.0–0.1)
Basophils Relative: 0 % (ref 0–1)
Eosinophils Absolute: 0.2 10*3/uL (ref 0.0–0.7)
Eosinophils Relative: 3 % (ref 0–5)
HCT: 45.4 % (ref 39.0–52.0)
HEMOGLOBIN: 15.3 g/dL (ref 13.0–17.0)
LYMPHS ABS: 2.3 10*3/uL (ref 0.7–4.0)
Lymphocytes Relative: 28 % (ref 12–46)
MCH: 28.5 pg (ref 26.0–34.0)
MCHC: 33.7 g/dL (ref 30.0–36.0)
MCV: 84.7 fL (ref 78.0–100.0)
MONOS PCT: 13 % — AB (ref 3–12)
MPV: 9.5 fL (ref 8.6–12.4)
Monocytes Absolute: 1.1 10*3/uL — ABNORMAL HIGH (ref 0.1–1.0)
NEUTROS ABS: 4.6 10*3/uL (ref 1.7–7.7)
NEUTROS PCT: 56 % (ref 43–77)
Platelets: 318 10*3/uL (ref 150–400)
RBC: 5.36 MIL/uL (ref 4.22–5.81)
RDW: 14.1 % (ref 11.5–15.5)
WBC: 8.2 10*3/uL (ref 4.0–10.5)

## 2015-07-12 LAB — BASIC METABOLIC PANEL
BUN: 24 mg/dL (ref 7–25)
CHLORIDE: 104 mmol/L (ref 98–110)
CO2: 24 mmol/L (ref 20–31)
CREATININE: 1.01 mg/dL (ref 0.70–1.25)
Calcium: 9 mg/dL (ref 8.6–10.3)
Glucose, Bld: 89 mg/dL (ref 65–99)
Potassium: 3.9 mmol/L (ref 3.5–5.3)
SODIUM: 138 mmol/L (ref 135–146)

## 2015-07-12 MED ORDER — METOPROLOL SUCCINATE ER 50 MG PO TB24: 50.0000 mg | ORAL_TABLET | ORAL | Status: DC

## 2015-07-12 MED ORDER — RIVAROXABAN 20 MG PO TABS: 20.0000 mg | ORAL_TABLET | Freq: Every day | ORAL | Status: DC

## 2015-07-12 NOTE — Progress Notes (Signed)
Cardiology Office Note   Date:  07/12/2015   ID:  Adrian, Clark [birth year], MRN [id number]  PCP:  [facility]    Chief Complaint  Patient presents with  . Atrial Fibrillation  . Sleep Apnea      History of Present Illness: Adrian Clark is a 60 y.o. male with a history of PAF s/p ablation with recurrent atrial fibrillation, OSA and HTN who presents back today for followup. He denies any chest pain, SOB, DOE, LE edema, dizziness, or syncope.He says that back in November he felt fluttering in his chest but is feeling fine since then.  He has had a cold recently.  He is doing well with his CPAP therapy and tolerates his device well without any problems. He feels rested in the am and has no daytime sleepiness. He tolerates the nasal pillow mask which he tolerates well and does not snore.      Past Medical History  Diagnosis Date  . OSA (obstructive sleep apnea)   . HTN (hypertension)   . PAF (paroxysmal atrial fibrillation) ([place]) 2013    s/p ablation     Past Surgical History  Procedure Laterality Date  . Cholecystectomy    . Tonsillectomy    . Vasectomy    . Tee without cardioversion N/A 07/11/2013    Procedure: TRANSESOPHAGEAL ECHOCARDIOGRAM (TEE);  Surgeon: Adrian Margarita, MD;  Location[facility] ENDOSCOPY;  Service: Cardiovascular;  Laterality: N/A;  . Cardioversion N/A 07/11/2013    Procedure: CARDIOVERSION;  Surgeon: Adrian Margarita, MD;  Location: [facility] ENDOSCOPY;  Service: Cardiovascular;  Laterality: N/A;  . Ablation      AFIB     Current Outpatient Prescriptions  Medication Sig Dispense Refill  . amLODipine-benazepril (LOTREL) 10-40 MG per capsule TAKE 1 CAPSULE BY MOUTH EVERY DAY 90 capsule 3  . aspirin EC 81 MG tablet Take 1 tablet (81 mg total) by mouth daily.    . metoprolol succinate (TOPROL-XL) 50 MG 24 hr tablet Take 50 mg by mouth daily. Take with or immediately following a meal.     No current  facility-administered medications for this visit.    Allergies:   Review of patient's allergies indicates no known allergies.    Social History:  The patient  reports that he has never smoked. He does not have any smokeless tobacco history on file. He reports that he does not use illicit drugs.   Family History:  The patient's family history includes Prostate cancer in his father; Uterine cancer in his mother.    ROS:  Please see the history of present illness.   Otherwise, review of systems are positive for none.   All other systems are reviewed and negative.    PHYSICAL EXAM: VS:  BP 138/92 mmHg  Pulse 95  Ht 6\' 2"  (1.88 m)  Wt 248 lb (112.492 kg)  BMI 31.83 kg/m2 , BMI Body mass index is 31.83 kg/(m^2). GEN: Well nourished, well developed, in no acute distress HEENT: normal Neck: no JVD, carotid bruits, or masses Cardiac: irregularly irregular; no murmurs, rubs, or gallops,no edema  Respiratory:  clear to auscultation bilaterally, normal work of breathing GI: soft, nontender, nondistended, + BS MS: no deformity or atrophy Skin: warm and dry, no rash Neuro:  Strength and sensation are intact Psych: euthymic mood, full affect   EKG:  EKG is not ordered today.  Recent Labs: No results found for requested labs within last 365 days.    Lipid Panel    Component Value Date/Time   CHOL 159 07/09/2013 0430   TRIG 89 07/09/2013 0430   HDL 42 07/09/2013 0430   CHOLHDL 3.8 07/09/2013 0430   VLDL 18 07/09/2013 0430   LDLCALC 99 07/09/2013 0430      Wt Readings from Last 3 Encounters:  07/12/15 248 lb (112.492 kg)  01/12/15 242 lb (109.77 kg)  06/14/14 239 lb 3.2 oz (108.5 kg)    ASSESSMENT AND PLAN:  1. Atrial fibrillation - He is now back in atrial fibrillation with CVR.  He is asymptomatic.  CHADS2VASC score is 1 (HTN). - start Xarelto 20mg  daily and check NOAC panel since he is back in atrial fibrillation.  Once he has been on anticoagulation for 4 weeks will  plan DCCV.   - increase Toprol to 50mg  qam and 35mg  qpm - stop ASA since starting Xarelto 2. OSA on CPAP - he tolerates his CPAP.His d/l today showed a AHI of 0.8/hr on 6cm H2O.  He will continue on current settings.   3. HTN - controlled today.  - continue Toprol and Lotrel    Current medicines are reviewed at length with the patient today.  The patient does not have concerns regarding medicines.  The following changes have been made:  no change  Labs/ tests ordered today: See above Assessment and Plan No orders of the defined types were placed in this encounter.     Disposition:   FU with me in 6 months and PA in 4 weeks to set up for DCCV  Signed, Adrian Margarita, MD  07/12/2015 8:29 AM    [facility] [place], [place], [place]  [address] Phone: [phone]; Fax: [phone]

## 2015-07-12 NOTE — Patient Instructions (Signed)
Medication Instructions:  Your physician has recommended you make the following change in your medication:  1) STOP ASPIRIN 2) START XARELTO 20 mg daily 3) INCREASE TOPROL to 50 mg in the morning and 25 mg in the evening  Labwork: TODAY: BMET, CBC  Testing/Procedures: None  Follow-Up: Your physician recommends that you schedule a follow-up appointment in 4 weeks with a PA or NP.  Your physician wants you to follow-up in: 6 months with Dr. [name]. You will receive a reminder letter in the mail two months in advance. If you don't receive a letter, please call our office to schedule the follow-up appointment.   Any Other Special Instructions Will Be Listed Below (If Applicable).     If you need a refill on your cardiac medications before your next appointment, please call your pharmacy.

## 2015-07-13 ENCOUNTER — Telehealth: Payer: Self-pay | Admitting: *Deleted

## 2015-07-13 NOTE — Telephone Encounter (Signed)
Pt has been notified of normal lab results by phone with verbal understanding.

## 2015-07-17 ENCOUNTER — Encounter: Payer: Self-pay | Admitting: Cardiology

## 2015-07-18 ENCOUNTER — Other Ambulatory Visit: Payer: Self-pay

## 2015-07-18 NOTE — Telephone Encounter (Signed)
Approved      Disp Refills Start End    amLODipine-benazepril (LOTREL) 10-40 MG per capsule 90 capsule 3 12/18/2014     Sig:  TAKE 1 CAPSULE BY MOUTH EVERY DAY    Class:  Normal    DAW:  No    Authorizing Provider:  [name], MD    Ordering User:  Stephannie Peters, CMA

## 2015-07-31 ENCOUNTER — Other Ambulatory Visit: Payer: Self-pay

## 2015-07-31 ENCOUNTER — Encounter: Payer: Self-pay | Admitting: Cardiology

## 2015-07-31 MED ORDER — METOPROLOL SUCCINATE ER 50 MG PO TB24: 50.0000 mg | ORAL_TABLET | ORAL | Status: DC

## 2015-08-08 NOTE — Progress Notes (Addendum)
Cardiology Office Note:    Date:  08/09/2015   ID:  Adrian Clark, DOB 02-May-1956, MRN [id number]  PCP:  [name], MD  Cardiologist:  Dr. Fransico Him   Electrophysiologist:  n/a  Chief Complaint  Patient presents with  . Atrial Fibrillation    Follow-up    History of Present Illness:     Adrian Clark is a 60 y.o. male with a hx of PAF status post ablation in 2013 [facility] in [place], [place]), OSA, HTN.  Admitted in 1/15 with AF with RVR.  He had LA thrombus on TEE.  He eventually converted to NSR on his own.  Last seen by Dr. [name] 07/12/15. Patient was back in atrial fibrillation.  CHADS2-VASc=1 (HTN).  He was placed on Xarelto 20 mg QD with plans to proceed with DCCV after 4 weeks of uninterrupted anticoagulation.   He returns for FU.  He has overall been doing well.  The patient denies any chest pain, significant dyspnea, syncope, orthopnea, PND, edema.  He did drink some cherry juice earlier this week. He had a nosebleed the next day. This is resolved.   Past Medical History  Diagnosis Date  . OSA (obstructive sleep apnea)   . HTN (hypertension)   . PAF (paroxysmal atrial fibrillation) ([place]) 2013    s/p ablation     Past Surgical History  Procedure Laterality Date  . Cholecystectomy    . Tonsillectomy    . Vasectomy    . Tee without cardioversion N/A 07/11/2013    Procedure: TRANSESOPHAGEAL ECHOCARDIOGRAM (TEE);  Surgeon: [name], MD;  Location: [facility] ENDOSCOPY;  Service: Cardiovascular;  Laterality: N/A;  . Cardioversion N/A 07/11/2013    Procedure: CARDIOVERSION;  Surgeon: [name], MD;  Location: [facility] ENDOSCOPY;  Service: Cardiovascular;  Laterality: N/A;  . Ablation      AFIB    Current Medications: Outpatient Prescriptions Prior to Visit  Medication Sig Dispense Refill  . amLODipine-benazepril (LOTREL) 10-40 MG per capsule TAKE 1 CAPSULE BY MOUTH EVERY DAY 90 capsule 3  . rivaroxaban (XARELTO) 20 MG TABS tablet Take 1 tablet (20 mg  total) by mouth daily with supper. 30 tablet 11  . metoprolol succinate (TOPROL-XL) 50 MG 24 hr tablet Take 1 tablet (50 mg total) by mouth every morning. Take 25 mg (half tablet) in the evening. (Patient taking differently: Take 50 mg by mouth 2 (two) times daily. ) 135 tablet 3   No facility-administered medications prior to visit.     Outpatient Encounter Prescriptions as of 08/09/2015  Medication Sig  . amLODipine-benazepril (LOTREL) 10-40 MG per capsule TAKE 1 CAPSULE BY MOUTH EVERY DAY  . rivaroxaban (XARELTO) 20 MG TABS tablet Take 1 tablet (20 mg total) by mouth daily with supper.  . [DISCONTINUED] metoprolol succinate (TOPROL-XL) 50 MG 24 hr tablet Take 1 tablet (50 mg total) by mouth every morning. Take 25 mg (half tablet) in the evening. (Patient taking differently: Take 50 mg by mouth 2 (two) times daily. )  . [START ON 08/20/2015] metoprolol succinate (TOPROL-XL) 50 MG 24 hr tablet Take 1 tablet (50 mg total) by mouth daily. START NEW DOSE ON 08/20/15 AFTER CARDIOVERSION   No facility-administered encounter medications on file as of 08/09/2015.    Allergies:   Review of patient's allergies indicates no known allergies.   Social History   Social History  . Marital Status: Married    Spouse Name: N/A  . Number of Children: N/A  . Years  of Education: N/A   Social History Main Topics  . Smoking status: Never Smoker   . Smokeless tobacco: None  . Alcohol Use: None  . Drug Use: No  . Sexual Activity: Not Asked   Other Topics Concern  . None   Social History Narrative     Family History:  The patient's family history includes Prostate cancer in his father; Uterine cancer in his mother.   ROS:   Please see the history of present illness.    Review of Systems  Cardiovascular: Positive for irregular heartbeat.  All other systems reviewed and are negative.   Physical Exam:    VS:  BP 140/80 mmHg  Pulse 80  Ht 6\' 2"  (1.88 m)  Wt 245 lb 12.8 oz (111.494 kg)  BMI 31.55  kg/m2   GEN: Well nourished, well developed, in no acute distress HEENT: normal Neck: no JVD, no masses Cardiac: Normal S1/S2, irregularly irregular rhythm; no murmurs, no edema   Respiratory:  clear to auscultation bilaterally; no wheezing, rhonchi or rales GI: soft, nontender MS: no deformity or atrophy Skin: warm and dry, no rash Neuro:  no focal deficits  Psych: Alert and oriented x 3, normal affect  Wt Readings from Last 3 Encounters:  08/09/15 245 lb 12.8 oz (111.494 kg)  07/12/15 248 lb (112.492 kg)  01/12/15 242 lb (109.77 kg)      Studies/Labs Reviewed:     EKG:  EKG is  ordered today.  The ekg ordered today demonstrates Atrial fibrillation, HR 79, nonspecific ST-T wave changes, no change from prior tracing  Recent Labs: 07/12/2015: BUN 24; Creat 1.01; Hemoglobin 15.3; Platelets 318; Potassium 3.9; Sodium 138   Recent Lipid Panel    Component Value Date/Time   CHOL 159 07/09/2013 0430   TRIG 89 07/09/2013 0430   HDL 42 07/09/2013 0430   CHOLHDL 3.8 07/09/2013 0430   VLDL 18 07/09/2013 0430   LDLCALC 99 07/09/2013 0430    Additional studies/ records that were reviewed today include:   Event monitor 4/15 NSR, sinus bradycardia  TEE 1/15 EF 55-60%, normal wall motion, trivial AI, mild MR, moderate LAE, + LA clot    ASSESSMENT:     1. PAF (paroxysmal atrial fibrillation) ([place])   2. Essential hypertension   3. OSA (obstructive sleep apnea)     PLAN:     In order of problems listed above:  1. PAF - Recurrent AF.  Controlled rate. CHADS2-VASc=1.  He has remained on Xarelto without interruption for 4 weeks.  Will arrange elective DCCV.  Will arrange FU 4 weeks post DCCV to DC Xarelto if remaining in NSR.  Consider referral back to EP (Allred/Camnitz) if AF recurs.   Resume usual dose of Toprol-XL 50 mg daily at time of cardioversion.  2. HTN - Borderline control. He notes optimal numbers at home.  3. OSA - Continue CPAP.     Medication  Adjustments/Labs and Tests Ordered: Current medicines are reviewed at length with the patient today.  Concerns regarding medicines are outlined above.  Medication changes, Labs and Tests ordered today are outlined in the Patient Instructions noted below. Patient Instructions  Medication Instructions:  STARTING ON 08/20/15 AFTER YOUR CARDIOVERSION YOU WILL CHANGE TOPROL XL TO 50 MG DAILY Labwork: TODAY  BMET  Testing/Procedures: Your physician has recommended that you have a Cardioversion (DCCV). Electrical Cardioversion uses a jolt of electricity to your heart either through paddles or wired patches attached to your chest. This is a controlled, usually prescheduled,  procedure. Defibrillation is done under light anesthesia in the hospital, and you usually go home the day of the procedure. This is done to get your heart back into a normal rhythm. You are not awake for the procedure. Please see the instruction sheet given to you today.  Follow-Up: 09/24/15 @ 8:30 WITH Kyen Taite, PAC SAME DAY DR. [name] IS IN THE OFFICE  Any Other Special Instructions Will Be Listed Below (If Applicable).  If you need a refill on your cardiac medications before your next appointment, please call your pharmacy.     Signed, Richardson Dopp, PA-C  08/09/2015 9:54 AM    [facility] [address], [place], [place]  [address] Phone: [phone]; Fax: [phone]

## 2015-08-09 ENCOUNTER — Telehealth: Payer: Self-pay | Admitting: *Deleted

## 2015-08-09 ENCOUNTER — Ambulatory Visit (INDEPENDENT_AMBULATORY_CARE_PROVIDER_SITE_OTHER): Payer: BLUE CROSS/BLUE SHIELD | Admitting: Physician Assistant

## 2015-08-09 ENCOUNTER — Encounter: Payer: Self-pay | Admitting: Physician Assistant

## 2015-08-09 ENCOUNTER — Encounter: Payer: Self-pay | Admitting: *Deleted

## 2015-08-09 VITALS — BP 140/80 | HR 80 | Ht 74.0 in | Wt 245.8 lb

## 2015-08-09 DIAGNOSIS — G4733 Obstructive sleep apnea (adult) (pediatric): Secondary | ICD-10-CM

## 2015-08-09 DIAGNOSIS — I10 Essential (primary) hypertension: Secondary | ICD-10-CM

## 2015-08-09 DIAGNOSIS — I480 Paroxysmal atrial fibrillation: Secondary | ICD-10-CM | POA: Diagnosis not present

## 2015-08-09 LAB — BASIC METABOLIC PANEL
BUN: 19 mg/dL (ref 7–25)
CHLORIDE: 103 mmol/L (ref 98–110)
CO2: 25 mmol/L (ref 20–31)
Calcium: 9.1 mg/dL (ref 8.6–10.3)
Creat: 1.08 mg/dL (ref 0.70–1.25)
Glucose, Bld: 76 mg/dL (ref 65–99)
POTASSIUM: 4.7 mmol/L (ref 3.5–5.3)
SODIUM: 138 mmol/L (ref 135–146)

## 2015-08-09 MED ORDER — METOPROLOL SUCCINATE ER 50 MG PO TB24: 50.0000 mg | ORAL_TABLET | Freq: Every day | ORAL | Status: DC

## 2015-08-09 NOTE — Patient Instructions (Addendum)
Medication Instructions:  STARTING ON 08/20/15 AFTER YOUR CARDIOVERSION YOU WILL CHANGE TOPROL XL TO 50 MG DAILY Labwork: TODAY  BMET  Testing/Procedures: Your physician has recommended that you have a Cardioversion (DCCV). Electrical Cardioversion uses a jolt of electricity to your heart either through paddles or wired patches attached to your chest. This is a controlled, usually prescheduled, procedure. Defibrillation is done under light anesthesia in the hospital, and you usually go home the day of the procedure. This is done to get your heart back into a normal rhythm. You are not awake for the procedure. Please see the instruction sheet given to you today.  Follow-Up: 09/24/15 @ 8:30 WITH SCOTT WEAVER, PAC SAME DAY DR. [name] IS IN THE OFFICE  Any Other Special Instructions Will Be Listed Below (If Applicable).  If you need a refill on your cardiac medications before your next appointment, please call your pharmacy.

## 2015-08-09 NOTE — Telephone Encounter (Signed)
Pt has been notified of lab results by phone with verbal understanding. 

## 2015-08-09 NOTE — Addendum Note (Signed)
Addended byKathlen Mody, Nicki Reaper T on: 08/09/2015 05:56 PM   Modules accepted: Orders

## 2015-08-10 ENCOUNTER — Encounter: Payer: Self-pay | Admitting: Physician Assistant

## 2015-08-20 ENCOUNTER — Ambulatory Visit (HOSPITAL_COMMUNITY): Payer: BLUE CROSS/BLUE SHIELD | Admitting: Anesthesiology

## 2015-08-20 ENCOUNTER — Ambulatory Visit (HOSPITAL_COMMUNITY)
Admission: RE | Admit: 2015-08-20 | Discharge: 2015-08-20 | Disposition: A | Discharge status: Home / Self Care | Payer: BLUE CROSS/BLUE SHIELD | Source: Ambulatory Visit | Attending: Cardiovascular Disease | Admitting: Cardiovascular Disease

## 2015-08-20 ENCOUNTER — Encounter (HOSPITAL_COMMUNITY): Payer: Self-pay | Admitting: *Deleted

## 2015-08-20 ENCOUNTER — Encounter (HOSPITAL_COMMUNITY)
Admission: RE | Disposition: A | Discharge status: Home / Self Care | Payer: Self-pay | Source: Ambulatory Visit | Attending: Cardiovascular Disease

## 2015-08-20 DIAGNOSIS — G4733 Obstructive sleep apnea (adult) (pediatric): Secondary | ICD-10-CM | POA: Insufficient documentation

## 2015-08-20 DIAGNOSIS — Z79899 Other long term (current) drug therapy: Secondary | ICD-10-CM | POA: Insufficient documentation

## 2015-08-20 DIAGNOSIS — I10 Essential (primary) hypertension: Secondary | ICD-10-CM | POA: Diagnosis not present

## 2015-08-20 DIAGNOSIS — Z7901 Long term (current) use of anticoagulants: Secondary | ICD-10-CM | POA: Insufficient documentation

## 2015-08-20 DIAGNOSIS — I4891 Unspecified atrial fibrillation: Secondary | ICD-10-CM | POA: Diagnosis present

## 2015-08-20 DIAGNOSIS — I480 Paroxysmal atrial fibrillation: Secondary | ICD-10-CM | POA: Diagnosis not present

## 2015-08-20 HISTORY — PX: CARDIOVERSION: SHX1299

## 2015-08-20 SURGERY — CARDIOVERSION
Anesthesia: General

## 2015-08-20 MED ORDER — LIDOCAINE HCL (CARDIAC) 20 MG/ML IV SOLN
INTRAVENOUS | Status: DC | PRN
  Administered 2015-08-20: 100 mg via INTRAVENOUS

## 2015-08-20 MED ORDER — LACTATED RINGERS IV SOLN
INTRAVENOUS | Status: DC | PRN
  Administered 2015-08-20: 13:00:00 via INTRAVENOUS

## 2015-08-20 MED ORDER — PROPOFOL 10 MG/ML IV BOLUS
INTRAVENOUS | Status: DC | PRN
  Administered 2015-08-20: 100 mg via INTRAVENOUS
  Administered 2015-08-20: 50 mg via INTRAVENOUS

## 2015-08-20 NOTE — Interval H&P Note (Signed)
History and Physical Interval Note:  08/20/2015 1:05 PM  Adrian Clark  has presented today for surgery, with the diagnosis of AFIB  The various methods of treatment have been discussed with the patient and family. After consideration of risks, benefits and other options for treatment, the patient has consented to  Procedure(s): CARDIOVERSION (N/A) as a surgical intervention .  The patient's history has been reviewed, patient examined, no change in status, stable for surgery.  I have reviewed the patient's chart and labs.  Questions were answered to the patient's satisfaction.     Kiley Solimine

## 2015-08-20 NOTE — Discharge Instructions (Signed)
Electrical Cardioversion, Care After °Refer to this sheet in the next few weeks. These instructions provide you with information on caring for yourself after your procedure. Your health care provider may also give you more specific instructions. Your treatment has been planned according to current medical practices, but problems sometimes occur. Call your health care provider if you have any problems or questions after your procedure. °WHAT TO EXPECT AFTER THE PROCEDURE °After your procedure, it is typical to have the following sensations: °· Some redness on the skin where the shocks were delivered. If this is tender, a sunburn lotion or hydrocortisone cream may help. °· Possible return of an abnormal heart rhythm within hours or days after the procedure. °HOME CARE INSTRUCTIONS °· Take medicines only as directed by your health care provider. Be sure you understand how and when to take your medicine. °· Learn how to feel your pulse and check it often. °· Limit your activity for 48 hours after the procedure or as directed by your health care provider. °· Avoid or minimize caffeine and other stimulants as directed by your health care provider. °SEEK MEDICAL CARE IF: °· You feel like your heart is beating too fast or your pulse is not regular. °· You have any questions about your medicines. °· You have bleeding that will not stop. °SEEK IMMEDIATE MEDICAL CARE IF: °· You are dizzy or feel faint. °· It is hard to breathe or you feel short of breath. °· There is a change in discomfort in your chest. °· Your speech is slurred or you have trouble moving an arm or leg on one side of your body. °· You get a serious muscle cramp that does not go away. °· Your fingers or toes turn cold or blue. °  °This information is not intended to replace advice given to you by your health care provider. Make sure you discuss any questions you have with your health care provider. °  °Document Released: 04/06/2013 Document Revised: 07/07/2014  Document Reviewed: 04/06/2013 °Elsevier Interactive Patient Education ©2016 [employer] Inc[employer] ° °

## 2015-08-20 NOTE — H&P (View-Only) (Signed)
Cardiology Office Note:    Date:  08/09/2015   ID:  Adrian Clark, DOB October 13, 1955, MRN [id number]  PCP:  [name], MD  Cardiologist:  Dr. Fransico Him   Electrophysiologist:  n/a  Chief Complaint  Patient presents with  . Atrial Fibrillation    Follow-up    History of Present Illness:     Adrian Clark is a 60 y.o. male with a hx of PAF status post ablation in 2013 [facility] in [place], [place]), OSA, HTN.  Admitted in 1/15 with AF with RVR.  He had LA thrombus on TEE.  He eventually converted to NSR on his own.  Last seen by Dr. [name] 07/12/15. Patient was back in atrial fibrillation.  CHADS2-VASc=1 (HTN).  He was placed on Xarelto 20 mg QD with plans to proceed with DCCV after 4 weeks of uninterrupted anticoagulation.   He returns for FU.  He has overall been doing well.  The patient denies any chest pain, significant dyspnea, syncope, orthopnea, PND, edema.  He did drink some cherry juice earlier this week. He had a nosebleed the next day. This is resolved.   Past Medical History  Diagnosis Date  . OSA (obstructive sleep apnea)   . HTN (hypertension)   . PAF (paroxysmal atrial fibrillation) ([place]) 2013    s/p ablation     Past Surgical History  Procedure Laterality Date  . Cholecystectomy    . Tonsillectomy    . Vasectomy    . Tee without cardioversion N/A 07/11/2013    Procedure: TRANSESOPHAGEAL ECHOCARDIOGRAM (TEE);  Surgeon: [name], MD;  Location: [facility] ENDOSCOPY;  Service: Cardiovascular;  Laterality: N/A;  . Cardioversion N/A 07/11/2013    Procedure: CARDIOVERSION;  Surgeon: [name], MD;  Location: [facility] ENDOSCOPY;  Service: Cardiovascular;  Laterality: N/A;  . Ablation      AFIB    Current Medications: Outpatient Prescriptions Prior to Visit  Medication Sig Dispense Refill  . amLODipine-benazepril (LOTREL) 10-40 MG per capsule TAKE 1 CAPSULE BY MOUTH EVERY DAY 90 capsule 3  . rivaroxaban (XARELTO) 20 MG TABS tablet Take 1 tablet (20 mg  total) by mouth daily with supper. 30 tablet 11  . metoprolol succinate (TOPROL-XL) 50 MG 24 hr tablet Take 1 tablet (50 mg total) by mouth every morning. Take 25 mg (half tablet) in the evening. (Patient taking differently: Take 50 mg by mouth 2 (two) times daily. ) 135 tablet 3   No facility-administered medications prior to visit.     Outpatient Encounter Prescriptions as of 08/09/2015  Medication Sig  . amLODipine-benazepril (LOTREL) 10-40 MG per capsule TAKE 1 CAPSULE BY MOUTH EVERY DAY  . rivaroxaban (XARELTO) 20 MG TABS tablet Take 1 tablet (20 mg total) by mouth daily with supper.  . [DISCONTINUED] metoprolol succinate (TOPROL-XL) 50 MG 24 hr tablet Take 1 tablet (50 mg total) by mouth every morning. Take 25 mg (half tablet) in the evening. (Patient taking differently: Take 50 mg by mouth 2 (two) times daily. )  . [START ON 08/20/2015] metoprolol succinate (TOPROL-XL) 50 MG 24 hr tablet Take 1 tablet (50 mg total) by mouth daily. START NEW DOSE ON 08/20/15 AFTER CARDIOVERSION   No facility-administered encounter medications on file as of 08/09/2015.    Allergies:   Review of patient's allergies indicates no known allergies.   Social History   Social History  . Marital Status: Married    Spouse Name: N/A  . Number of Children: N/A  . Years  of Education: N/A   Social History Main Topics  . Smoking status: Never Smoker   . Smokeless tobacco: None  . Alcohol Use: None  . Drug Use: No  . Sexual Activity: Not Asked   Other Topics Concern  . None   Social History Narrative     Family History:  The patient's family history includes Prostate cancer in his father; Uterine cancer in his mother.   ROS:   Please see the history of present illness.    Review of Systems  Cardiovascular: Positive for irregular heartbeat.  All other systems reviewed and are negative.   Physical Exam:    VS:  BP 140/80 mmHg  Pulse 80  Ht 6\' 2"  (1.88 m)  Wt 245 lb 12.8 oz (111.494 kg)  BMI 31.55  kg/m2   GEN: Well nourished, well developed, in no acute distress HEENT: normal Neck: no JVD, no masses Cardiac: Normal S1/S2, irregularly irregular rhythm; no murmurs, no edema   Respiratory:  clear to auscultation bilaterally; no wheezing, rhonchi or rales GI: soft, nontender MS: no deformity or atrophy Skin: warm and dry, no rash Neuro:  no focal deficits  Psych: Alert and oriented x 3, normal affect  Wt Readings from Last 3 Encounters:  08/09/15 245 lb 12.8 oz (111.494 kg)  07/12/15 248 lb (112.492 kg)  01/12/15 242 lb (109.77 kg)      Studies/Labs Reviewed:     EKG:  EKG is  ordered today.  The ekg ordered today demonstrates Atrial fibrillation, HR 79, nonspecific ST-T wave changes, no change from prior tracing  Recent Labs: 07/12/2015: BUN 24; Creat 1.01; Hemoglobin 15.3; Platelets 318; Potassium 3.9; Sodium 138   Recent Lipid Panel    Component Value Date/Time   CHOL 159 07/09/2013 0430   TRIG 89 07/09/2013 0430   HDL 42 07/09/2013 0430   CHOLHDL 3.8 07/09/2013 0430   VLDL 18 07/09/2013 0430   LDLCALC 99 07/09/2013 0430    Additional studies/ records that were reviewed today include:   Event monitor 4/15 NSR, sinus bradycardia  TEE 1/15 EF 55-60%, normal wall motion, trivial AI, mild MR, moderate LAE, + LA clot    ASSESSMENT:     1. PAF (paroxysmal atrial fibrillation) ([place])   2. Essential hypertension   3. OSA (obstructive sleep apnea)     PLAN:     In order of problems listed above:  1. PAF - Recurrent AF.  Controlled rate. CHADS2-VASc=1.  He has remained on Xarelto without interruption for 4 weeks.  Will arrange elective DCCV.  Will arrange FU 4 weeks post DCCV to DC Xarelto if remaining in NSR.  Consider referral back to EP (Allred/Camnitz) if AF recurs.   Resume usual dose of Toprol-XL 50 mg daily at time of cardioversion.  2. HTN - Borderline control. He notes optimal numbers at home.  3. OSA - Continue CPAP.     Medication  Adjustments/Labs and Tests Ordered: Current medicines are reviewed at length with the patient today.  Concerns regarding medicines are outlined above.  Medication changes, Labs and Tests ordered today are outlined in the Patient Instructions noted below. Patient Instructions  Medication Instructions:  STARTING ON 08/20/15 AFTER YOUR CARDIOVERSION YOU WILL CHANGE TOPROL XL TO 50 MG DAILY Labwork: TODAY  BMET  Testing/Procedures: Your physician has recommended that you have a Cardioversion (DCCV). Electrical Cardioversion uses a jolt of electricity to your heart either through paddles or wired patches attached to your chest. This is a controlled, usually prescheduled,  procedure. Defibrillation is done under light anesthesia in the hospital, and you usually go home the day of the procedure. This is done to get your heart back into a normal rhythm. You are not awake for the procedure. Please see the instruction sheet given to you today.  Follow-Up: 09/24/15 @ 8:30 WITH Nasim Garofano, PAC SAME DAY DR. [name] IS IN THE OFFICE  Any Other Special Instructions Will Be Listed Below (If Applicable).  If you need a refill on your cardiac medications before your next appointment, please call your pharmacy.     Signed, Richardson Dopp, PA-C  08/09/2015 9:54 AM    [facility] [place], [place], [place]  [address] Phone: [phone]; Fax: [phone]

## 2015-08-20 NOTE — Anesthesia Postprocedure Evaluation (Signed)
Anesthesia Post Note  Patient: Adrian Clark  Procedure(s) Performed: Procedure(s) (LRB): CARDIOVERSION (N/A)  Patient location during evaluation: PACU Anesthesia Type: General Level of consciousness: sedated and patient cooperative Pain management: pain level controlled Vital Signs Assessment: post-procedure vital signs reviewed and stable Respiratory status: spontaneous breathing Cardiovascular status: stable Anesthetic complications: no    Last Vitals:  Filed Vitals:   08/20/15 1330 08/20/15 1340  BP: 103/83 119/88  Pulse: 62 55  Temp:    Resp: 20 19    Last Pain: There were no vitals filed for this visit.               Nolon Nations

## 2015-08-20 NOTE — Anesthesia Preprocedure Evaluation (Addendum)
Anesthesia Evaluation  Patient identified by MRN, date of birth, ID band Patient awake  General Assessment Comment:Sedated by ENDO for TEE  Reviewed: Allergy & Precautions, H&P , NPO status , Patient's Chart, lab work & pertinent test results, reviewed documented beta blocker date and time   Airway Mallampati: II  TM Distance: >3 FB Neck ROM: Full    Dental  (+) Teeth Intact, Dental Advisory Given   Pulmonary neg pulmonary ROS, sleep apnea and Continuous Positive Airway Pressure Ventilation ,    breath sounds clear to auscultation       Cardiovascular hypertension, Pt. on medications and Pt. on home beta blockers + dysrhythmias Atrial Fibrillation  Rhythm:Irregular Rate:Normal  Echo 06/2013 - Left ventricle: Systolic function was normal. The estimated ejection fraction was in the range of 55% to 60%. Wall motion was normal; there were no regional wallmotion abnormalities. - Aortic valve: Trivial regurgitation. - Mitral valve: Mild regurgitation. - Left atrium: The atrium was moderately dilated. No evidence of thrombus in the atrial cavity. There was a thrombus. Cannot exclude thrombus in the appendage. Therewas spontaneous echo contrast ("smoke"). - Right atrium: No evidence of thrombus in the atrial cavity or appendage.    Neuro/Psych negative neurological ROS  negative psych ROS   GI/Hepatic negative GI ROS, Neg liver ROS,   Endo/Other  negative endocrine ROS  Renal/GU negative Renal ROS  negative genitourinary   Musculoskeletal negative musculoskeletal ROS (+)   Abdominal   Peds  Hematology negative hematology ROS (+)   Anesthesia Other Findings   Reproductive/Obstetrics                            Anesthesia Physical  Anesthesia Plan  ASA: III  Anesthesia Plan: General   Post-op Pain Management:    Induction: Intravenous  Airway Management Planned:  Mask  Additional Equipment:   Intra-op Plan:   Post-operative Plan:   Informed Consent: I have reviewed the patients History and Physical, chart, labs and discussed the procedure including the risks, benefits and alternatives for the proposed anesthesia with the patient or authorized representative who has indicated his/her understanding and acceptance.   Dental advisory given  Plan Discussed with: CRNA  Anesthesia Plan Comments:         Anesthesia Quick Evaluation

## 2015-08-20 NOTE — Op Note (Signed)
Procedure: Electrical Cardioversion Indications:  Atrial Fibrillation  Procedure Details:  Consent: Risks of procedure as well as the alternatives and risks of each were explained to the (patient/caregiver).  Consent for procedure obtained.  Time Out: Verified patient identification, verified procedure, site/side was marked, verified correct patient position, special equipment/implants available, medications/allergies/relevent history reviewed, required imaging and test results available.  Performed  Patient placed on cardiac monitor, pulse oximetry, supplemental oxygen as necessary.  Sedation given: propofol 150 mg IV, Dr. Lissa Hoard, Anesthesiology Pacer pads placed anterior and posterior chest.  Cardioverted 1 time(s).  Cardioversion with synchronized biphasic 120J shock.  Evaluation: Findings: Post procedure EKG shows: NSR Complications: None Patient did tolerate procedure well.  Time Spent Directly with the Patient:  30 minutes   Adrian Clark 08/20/2015, 1:21 PM

## 2015-08-20 NOTE — Transfer of Care (Signed)
Immediate Anesthesia Transfer of Care Note  Patient: Adrian Clark  Procedure(s) Performed: Procedure(s): CARDIOVERSION (N/A)  Patient Location: Endoscopy Unit  Anesthesia Type:General  Level of Consciousness: awake and patient cooperative  Airway & Oxygen Therapy: Patient Spontanous Breathing and Patient connected to nasal cannula oxygen  Post-op Assessment: Report given to RN, Post -op Vital signs reviewed and stable and Patient moving all extremities  Post vital signs: Reviewed and stable  Last Vitals:  Filed Vitals:   08/20/15 1156 08/20/15 1310  BP: 133/89 144/118  Pulse:  96  Temp: 36.5 C   Resp: [id number]    Complications: No apparent anesthesia complications

## 2015-08-21 ENCOUNTER — Encounter (HOSPITAL_COMMUNITY): Payer: Self-pay | Admitting: Cardiovascular Disease

## 2015-09-23 NOTE — Progress Notes (Signed)
Cardiology Office Note:    Date:  09/24/2015   ID:  Adrian Clark, DOB 08-09-1955, MRN [id number]  PCP:  [name], MD  Cardiologist:  Dr. Fransico Him   Electrophysiologist:  n/a  Chief Complaint  Patient presents with  . Atrial Fibrillation    s/p DCCV    History of Present Illness:     Adrian Clark is a 60 y.o. male with a hx of PAF status post ablation in 2013 [facility] in [place], [place]), OSA, HTN.  Admitted in 1/15 with AF with RVR.  He had LA thrombus on TEE.  He eventually converted to NSR on his own.  Last seen by Dr. [name] 07/12/15. Patient was back in atrial fibrillation.  CHADS2-VASc=1 (HTN).  He was placed on Xarelto 20 mg QD with plans to proceed with DCCV after 4 weeks of uninterrupted anticoagulation.   He underwent DCCV 08/20/15. He returns for follow-up. He remains in NSR.  The patient denies chest pain, shortness of breath, syncope, orthopnea, PND or significant pedal edema.    Past Medical History  Diagnosis Date  . OSA (obstructive sleep apnea)   . HTN (hypertension)   . PAF (paroxysmal atrial fibrillation) ([place]) 2013    s/p ablation     Past Surgical History  Procedure Laterality Date  . Cholecystectomy    . Tonsillectomy    . Vasectomy    . Tee without cardioversion N/A 07/11/2013    Procedure: TRANSESOPHAGEAL ECHOCARDIOGRAM (TEE);  Surgeon: [name], MD;  Location: [facility] ENDOSCOPY;  Service: Cardiovascular;  Laterality: N/A;  . Cardioversion N/A 07/11/2013    Procedure: CARDIOVERSION;  Surgeon: [name], MD;  Location: [facility] ENDOSCOPY;  Service: Cardiovascular;  Laterality: N/A;  . Ablation      AFIB  . Cardioversion N/A 08/20/2015    Procedure: CARDIOVERSION;  Surgeon: [name], MD;  Location: [facility] ENDOSCOPY;  Service: Cardiovascular;  Laterality: N/A;    Current Medications: Outpatient Prescriptions Prior to Visit  Medication Sig Dispense Refill  . amLODipine-benazepril (LOTREL) 10-40 MG per capsule TAKE 1 CAPSULE BY  MOUTH EVERY DAY 90 capsule 3  . metoprolol succinate (TOPROL-XL) 50 MG 24 hr tablet Take 1 tablet (50 mg total) by mouth daily. START NEW DOSE ON 08/20/15 AFTER CARDIOVERSION (Patient taking differently: Take 50 mg by mouth 2 (two) times daily. START NEW DOSE ON 08/20/15 AFTER CARDIOVERSION) 90 tablet 3  . rivaroxaban (XARELTO) 20 MG TABS tablet Take 1 tablet (20 mg total) by mouth daily with supper. 30 tablet 11   No facility-administered medications prior to visit.     Outpatient Encounter Prescriptions as of 09/24/2015  Medication Sig  . amLODipine-benazepril (LOTREL) 10-40 MG per capsule TAKE 1 CAPSULE BY MOUTH EVERY DAY  . metoprolol succinate (TOPROL-XL) 50 MG 24 hr tablet Take 1 tablet (50 mg total) by mouth daily. START NEW DOSE ON 08/20/15 AFTER CARDIOVERSION (Patient taking differently: Take 50 mg by mouth 2 (two) times daily. START NEW DOSE ON 08/20/15 AFTER CARDIOVERSION)  . [DISCONTINUED] rivaroxaban (XARELTO) 20 MG TABS tablet Take 1 tablet (20 mg total) by mouth daily with supper.  Marland Kitchen aspirin EC 81 MG tablet Take 1 tablet (81 mg total) by mouth daily.   No facility-administered encounter medications on file as of 09/24/2015.    Allergies:   Review of patient's allergies indicates no known allergies.   Social History   Social History  . Marital Status: Married    Spouse Name: N/A  . Number  of Children: N/A  . Years of Education: N/A   Social History Main Topics  . Smoking status: Never Smoker   . Smokeless tobacco: None  . Alcohol Use: 1.2 oz/week    2 Glasses of wine per week  . Drug Use: No  . Sexual Activity: Not Asked   Other Topics Concern  . None   Social History Narrative     Family History:  The patient's family history includes Prostate cancer in his father; Uterine cancer in his mother.   ROS:   Please see the history of present illness.    ROSAll other systems reviewed and are negative.   Physical Exam:    VS:  BP 120/70 mmHg  Pulse 54  Ht 6\' 3"   (1.905 m)  Wt 242 lb 1.9 oz (109.825 kg)  BMI 30.26 kg/m2   GEN: Well nourished, well developed, in no acute distress HEENT: normal Neck: no JVD, no masses Cardiac: Normal S1/S2, irregularly irregular rhythm; no murmurs, no edema   Respiratory:  clear to auscultation bilaterally; no wheezing, rhonchi or rales GI: soft, nontender MS: no deformity or atrophy Skin: warm and dry, no rash Neuro:  no focal deficits  Psych: Alert and oriented x 3, normal affect  Wt Readings from Last 3 Encounters:  09/24/15 242 lb 1.9 oz (109.825 kg)  08/09/15 245 lb 12.8 oz (111.494 kg)  07/12/15 248 lb (112.492 kg)      Studies/Labs Reviewed:     EKG:  EKG is  ordered today.  The ekg ordered today demonstrates Sinus brady, HR 54, 1st degree AVB PR 210 ms, NSSTTW changes, QTc 438 ms   Recent Labs: 07/12/2015: Hemoglobin 15.3; Platelets 318 08/09/2015: BUN 19; Creat 1.08; Potassium 4.7; Sodium 138   Recent Lipid Panel    Component Value Date/Time   CHOL 159 07/09/2013 0430   TRIG 89 07/09/2013 0430   HDL 42 07/09/2013 0430   CHOLHDL 3.8 07/09/2013 0430   VLDL 18 07/09/2013 0430   LDLCALC 99 07/09/2013 0430    Additional studies/ records that were reviewed today include:   Event monitor 4/15 NSR, sinus bradycardia  TEE 1/15 EF 55-60%, normal wall motion, trivial AI, mild MR, moderate LAE, + LA clot    ASSESSMENT:     1. PAF (paroxysmal atrial fibrillation) ([place])   2. Essential hypertension   3. OSA (obstructive sleep apnea)     PLAN:     In order of problems listed above:  1. PAF - Recurrent AF.  He is s/p DCCV.  He remains in NSR.  It has been > 30 days since his DCCV.  CHADS2-VASc=1.  He can DC his Xarelto and resume ASA.  If he has recurrent AFib, refer to EP (Allred/Camnitz) for further management of AFib.    2. HTN - Controlled.   3. OSA - Continue CPAP.     Medication Adjustments/Labs and Tests Ordered: Current medicines are reviewed at length with the patient today.   Concerns regarding medicines are outlined above.  Medication changes, Labs and Tests ordered today are outlined in the Patient Instructions noted below. Patient Instructions  Medication Instructions:  1. STOP XARELTO 2. START ASPRIN 81 MG DASILY  Labwork: NONE  Testing/Procedures: NONE  Follow-Up: DR. [name] IN AUGUST; WE WILL SEND OUT A REMINDER LETTER A COUPLE OF MONTHS EARLIER TO CALL AND MAKE APPT  Any Other Special Instructions Will Be Listed Below (If Applicable).  If you need a refill on your cardiac medications before your next  appointment, please call your pharmacy.     Signed, Richardson Dopp, PA-C  09/24/2015 8:49 AM    [facility] [place], [place], [place]  [address] Phone: [phone]; Fax: [phone]

## 2015-09-24 ENCOUNTER — Encounter: Payer: Self-pay | Admitting: Physician Assistant

## 2015-09-24 ENCOUNTER — Ambulatory Visit (INDEPENDENT_AMBULATORY_CARE_PROVIDER_SITE_OTHER): Payer: BLUE CROSS/BLUE SHIELD | Admitting: Physician Assistant

## 2015-09-24 VITALS — BP 120/70 | HR 54 | Ht 75.0 in | Wt 242.1 lb

## 2015-09-24 DIAGNOSIS — G4733 Obstructive sleep apnea (adult) (pediatric): Secondary | ICD-10-CM

## 2015-09-24 DIAGNOSIS — I480 Paroxysmal atrial fibrillation: Secondary | ICD-10-CM

## 2015-09-24 DIAGNOSIS — I10 Essential (primary) hypertension: Secondary | ICD-10-CM

## 2015-09-24 MED ORDER — ASPIRIN EC 81 MG PO TBEC: 81.0000 mg | DELAYED_RELEASE_TABLET | Freq: Every day | ORAL | Status: DC

## 2015-09-24 NOTE — Patient Instructions (Addendum)
Medication Instructions:  1. STOP XARELTO 2. START ASPRIN 81 MG DASILY  Labwork: NONE  Testing/Procedures: NONE  Follow-Up: DR. [name] IN AUGUST; WE WILL SEND OUT A REMINDER LETTER A COUPLE OF MONTHS EARLIER TO CALL AND MAKE APPT  Any Other Special Instructions Will Be Listed Below (If Applicable).  If you need a refill on your cardiac medications before your next appointment, please call your pharmacy.

## 2016-01-03 DIAGNOSIS — G4733 Obstructive sleep apnea (adult) (pediatric): Secondary | ICD-10-CM | POA: Diagnosis not present

## 2016-02-04 ENCOUNTER — Encounter: Payer: Self-pay | Admitting: *Deleted

## 2016-02-18 ENCOUNTER — Ambulatory Visit (INDEPENDENT_AMBULATORY_CARE_PROVIDER_SITE_OTHER): Payer: BLUE CROSS/BLUE SHIELD | Admitting: Cardiology

## 2016-02-18 ENCOUNTER — Encounter: Payer: Self-pay | Admitting: Cardiology

## 2016-02-18 VITALS — BP 142/88 | HR 60 | Ht 76.0 in | Wt 242.8 lb

## 2016-02-18 DIAGNOSIS — G4733 Obstructive sleep apnea (adult) (pediatric): Secondary | ICD-10-CM

## 2016-02-18 DIAGNOSIS — I480 Paroxysmal atrial fibrillation: Secondary | ICD-10-CM | POA: Diagnosis not present

## 2016-02-18 DIAGNOSIS — I10 Essential (primary) hypertension: Secondary | ICD-10-CM | POA: Diagnosis not present

## 2016-02-18 NOTE — Progress Notes (Signed)
Cardiology Office Note    Date:  02/18/2016   ID:  Adrian Clark, DOB [birth year], MRN [id number]  PCP:  [name], MD  Cardiologist:  Fransico Him, MD   Chief Complaint  Patient presents with  . Sleep Apnea  . Hypertension  . Atrial Fibrillation    History of Present Illness:  Adrian Clark is a 60 y.o. male with a history of PAF s/p status post ablation in 2013 [facility] in [place], [place]), OSA, HTN.  Admitted in 1/15 with AF with RVR.  He had LA thrombus on TEE.  He eventually converted to NSR on his own.  Patient was back in atrial fibrillation 07/2015  CHADS2-VASc=1 (HTN).  He was placed on Xarelto 20 mg QD with plans to proceed with DCCV after 4 weeks of uninterrupted anticoagulation.   He underwent DCCV 08/20/15 OSA and HTN who presents back today for followup. He denies any chest pain, SOB, DOE, LE edema, dizziness, or syncope.  He is doing well with his CPAP therapy and tolerates his device well without any problems. He feels rested in the am and has no daytime sleepiness. He tolerates the nasal pillow mask which he tolerates well and does not snore.   Past Medical History:  Diagnosis Date  . HTN (hypertension)   . OSA (obstructive sleep apnea)   . PAF (paroxysmal atrial fibrillation) ([place]) 2013   s/p ablation     Past Surgical History:  Procedure Laterality Date  . ABLATION     AFIB  . CARDIOVERSION N/A 07/11/2013   Procedure: CARDIOVERSION;  Surgeon: [name], MD;  Location: [place];  Service: Cardiovascular;  Laterality: N/A;  . CARDIOVERSION N/A 08/20/2015   Procedure: CARDIOVERSION;  Surgeon: [name], MD;  [facility] ENDOSCOPY;  Service: Cardiovascular;  Laterality: N/A;  . CHOLECYSTECTOMY    . TEE WITHOUT CARDIOVERSION N/A 07/11/2013   Procedure: TRANSESOPHAGEAL ECHOCARDIOGRAM (TEE);  Surgeon: [name], MD;  Location: [facility] ENDOSCOPY;  Service: Cardiovascular;  Laterality: N/A;  . TONSILLECTOMY    . VASECTOMY       Current Medications: Outpatient Medications Prior to Visit  Medication Sig Dispense Refill  . amLODipine-benazepril (LOTREL) 10-40 MG per capsule TAKE 1 CAPSULE BY MOUTH EVERY DAY 90 capsule 3  . aspirin EC 81 MG tablet Take 1 tablet (81 mg total) by mouth daily.    . metoprolol succinate (TOPROL-XL) 50 MG 24 hr tablet Take 1 tablet (50 mg total) by mouth daily. START NEW DOSE ON 08/20/15 AFTER CARDIOVERSION (Patient taking differently: Take 50 mg by mouth 2 (two) times daily. START NEW DOSE ON 08/20/15 AFTER CARDIOVERSION) 90 tablet 3   No facility-administered medications prior to visit.      Allergies:   Review of patient's allergies indicates no known allergies.   Social History   Social History  . Marital status: Married    Spouse name: N/A  . Number of children: N/A  . Years of education: N/A   Social History Main Topics  . Smoking status: Never Smoker  . Smokeless tobacco: None  . Alcohol use 1.2 oz/week    2 Glasses of wine per week  . Drug use: No  . Sexual activity: Not Asked   Other Topics Concern  . None   Social History Narrative  . None     Family History:  The patient's family history includes Prostate cancer in his father; Uterine cancer in his mother.   ROS:   Please see the  history of present illness.    Review of Systems  Musculoskeletal: Positive for back pain.   All other systems reviewed and are negative.   PHYSICAL EXAM:   VS:  BP (!) 142/88   Pulse 60   Ht 6\' 4"  (1.93 m)   Wt 242 lb 12.8 oz (110.1 kg)   SpO2 97%   BMI 29.55 kg/m    GEN: Well nourished, well developed, in no acute distress  HEENT: normal  Neck: no JVD, carotid bruits, or masses Cardiac: RRR; no murmurs, rubs, or gallops,no edema.  Intact distal pulses bilaterally.  Respiratory:  clear to auscultation bilaterally, normal work of breathing GI: soft, nontender, nondistended, + BS MS: no deformity or atrophy  Skin: warm and dry, no rash Neuro:  Alert and Oriented x 3,  Strength and sensation are intact Psych: euthymic mood, full affect  Wt Readings from Last 3 Encounters:  02/18/16 242 lb 12.8 oz (110.1 kg)  09/24/15 242 lb 1.9 oz (109.8 kg)  08/09/15 245 lb 12.8 oz (111.5 kg)      Studies/Labs Reviewed:   EKG:  EKG is not ordered today.   Recent Labs: 07/12/2015: Hemoglobin 15.3; Platelets 318 08/09/2015: BUN 19; Creat 1.08; Potassium 4.7; Sodium 138   Lipid Panel    Component Value Date/Time   CHOL 159 07/09/2013 0430   TRIG 89 07/09/2013 0430   HDL 42 07/09/2013 0430   CHOLHDL 3.8 07/09/2013 0430   VLDL 18 07/09/2013 0430   LDLCALC 99 07/09/2013 0430    Additional studies/ records that were reviewed today include:  none    ASSESSMENT:    1. Essential hypertension   2. PAF (paroxysmal atrial fibrillation) ([place])   3. OSA (obstructive sleep apnea)      PLAN:  In order of problems listed above:  1. HTN - BP controlled on current meds.Continue Lotrel and BB 2.  3. PAF -  status post ablation in 2013 [facility] in [place], HTN),then had reoccurrence of afib and s/p spontanenous conversion now maintaining NSR.  Continue BB.  CHADS2VASC score is 1 (HTN).  He would like to be referred to EP for consideration of ablation.  I will refer him to Dr. Curt Bears.  OSA - the patient is tolerating PAP therapy well without any problems. The PAP download was reviewed today and showed an AHI of 0.8/hr on 6 cm H2O with 98% compliance in using more than 4 hours nightly.  The patient has been using and benefiting from CPAP use and will continue to benefit from therapy.      Medication Adjustments/Labs and Tests Ordered: Current medicines are reviewed at length with the patient today.  Concerns regarding medicines are outlined above.  Medication changes, Labs and Tests ordered today are listed in the Patient Instructions below.  There are no Patient Instructions on file for this visit.   Signed, Fransico Him, MD  02/18/2016 8:49 AM      [facility] [place], [place], [place]  [address] Phone: [phone]; Fax: [phone]

## 2016-02-18 NOTE — Patient Instructions (Addendum)
Medication Instructions:  Your physician recommends that you continue on your current medications as directed. Please refer to the Current Medication list given to you today.   Labwork: None  Testing/Procedures: None  Follow-Up: You have been referred to DR. CAMNITZ for AFIB ABLATION.  Your physician wants you to follow-up in: 6 months with Dr. [name]. You will receive a reminder letter in the mail two months in advance. If you don't receive a letter, please call our office to schedule the follow-up appointment.   Any Other Special Instructions Will Be Listed Below (If Applicable).     If you need a refill on your cardiac medications before your next appointment, please call your pharmacy.

## 2016-02-19 ENCOUNTER — Encounter: Payer: Self-pay | Admitting: Cardiology

## 2016-03-07 ENCOUNTER — Encounter: Payer: Self-pay | Admitting: Cardiology

## 2016-03-24 ENCOUNTER — Encounter: Payer: Self-pay | Admitting: Cardiology

## 2016-03-24 ENCOUNTER — Ambulatory Visit (INDEPENDENT_AMBULATORY_CARE_PROVIDER_SITE_OTHER): Payer: BLUE CROSS/BLUE SHIELD | Admitting: Cardiology

## 2016-03-24 VITALS — BP 140/92 | HR 56 | Ht 76.0 in | Wt 250.0 lb

## 2016-03-24 DIAGNOSIS — I4819 Other persistent atrial fibrillation: Secondary | ICD-10-CM

## 2016-03-24 DIAGNOSIS — I481 Persistent atrial fibrillation: Secondary | ICD-10-CM

## 2016-03-24 NOTE — Patient Instructions (Signed)
Medication Instructions:    Your physician recommends that you continue on your current medications as directed. Please refer to the Current Medication list given to you today.  --- If you need a refill on your cardiac medications before your next appointment, please call your pharmacy. ---  Labwork:  None ordered  Testing/Procedures: Your physician has recommended that you have an ablation. Catheter ablation is a medical procedure used to treat some cardiac arrhythmias (irregular heartbeats). During catheter ablation, a long, thin, flexible tube is put into a blood vessel in your groin (upper thigh), or neck. This tube is called an ablation catheter. It is then guided to your heart through the blood vessel. Radio frequency waves destroy small areas of heart tissue where abnormal heartbeats may cause an arrhythmia to start. Please see the instruction sheet given to you today.  Follow-Up:  Stacee Earp, RN will call you to arrange follow up post ablation.  Thank you for choosing [facility]!!   Trinidad Curet, RN [phone]

## 2016-03-24 NOTE — Progress Notes (Signed)
Electrophysiology Office Note   Date:  03/24/2016   ID:  [name], DOB 1956/01/06, MRN [id number]  PCP:  Adrian Rim, MD  Cardiologist:  Adrian Clark Primary Electrophysiologist:  Falicity Sheets Meredith Leeds, MD    Chief Complaint  Patient presents with  . Advice Only    discuss afib ablation     History of Present Illness: Adrian Clark is a 60 y.o. male who presents today for electrophysiology evaluation.   History of PAF s/pstatus post ablation in 2013 [facility] in [place], [place]), OSA, HTN. Admitted in 1/15 with AF with RVR. He had LA thrombus on TEE. He eventually converted to NSR on his own.  Patient was back in atrial fibrillation 07/2015 CHADS2-VASc=1 (HTN). He was placed on Xarelto 20 mg QD with plans to proceed with DCCV after 4 weeks of uninterrupted anticoagulation. He underwent DCCV 08/20/15.    Today, he denies symptoms of palpitations, chest pain, shortness of breath, orthopnea, PND, lower extremity edema, claudication, dizziness, presyncope, syncope, bleeding, or neurologic sequela. The patient is tolerating medications without difficulties and is otherwise without complaint today. He has had 2 episodes of atrial fibrillation since his ablation. He presents today wanting a repeat atrial fibrillation ablation. He is considered medications in the past, but does not like the idea of the side effects.   Past Medical History:  Diagnosis Date  . HTN (hypertension)   . OSA (obstructive sleep apnea)   . PAF (paroxysmal atrial fibrillation) ([place]) 2013   s/p ablation    Past Surgical History:  Procedure Laterality Date  . ABLATION     AFIB  . CARDIOVERSION N/A 07/11/2013   Procedure: CARDIOVERSION;  Surgeon: Adrian Margarita, MD;  Location: [place];  Service: Cardiovascular;  Laterality: N/A;  . CARDIOVERSION N/A 08/20/2015   Procedure: CARDIOVERSION;  Surgeon: Adrian Klein, MD;  [facility] ENDOSCOPY;  Service: Cardiovascular;  Laterality: N/A;  .  CHOLECYSTECTOMY    . TEE WITHOUT CARDIOVERSION N/A 07/11/2013   Procedure: TRANSESOPHAGEAL ECHOCARDIOGRAM (TEE);  Surgeon: Adrian Margarita, MD;  Location: [facility] ENDOSCOPY;  Service: Cardiovascular;  Laterality: N/A;  . TONSILLECTOMY    . VASECTOMY       Current Outpatient Prescriptions  Medication Sig Dispense Refill  . amLODipine-benazepril (LOTREL) 10-40 MG per capsule TAKE 1 CAPSULE BY MOUTH EVERY DAY 90 capsule 3  . aspirin EC 81 MG tablet Take 1 tablet (81 mg total) by mouth daily.    . metoprolol succinate (TOPROL-XL) 50 MG 24 hr tablet Take 50 mg by mouth daily. Take with or immediately following a meal.     No current facility-administered medications for this visit.     Allergies:   Review of patient's allergies indicates no known allergies.   Social History:  The patient  reports that he has never smoked. He has never used smokeless tobacco. He reports that he drinks about 1.2 oz of alcohol per week . He reports that he does not use drugs.   Family History:  The patient's family history includes Prostate cancer in his father; Uterine cancer in his mother.    ROS:  Please see the history of present illness.   Otherwise, review of systems is positive for none.   All other systems are reviewed and negative.    PHYSICAL EXAM: VS:  BP (!) 140/92   Pulse (!) 56   Ht 6\' 4"  (1.93 m)   Wt 250 lb (113.4 kg)   BMI 30.43 kg/m  , BMI Body mass  index is 30.43 kg/m. GEN: Well nourished, well developed, in no acute distress  HEENT: normal  Neck: no JVD, carotid bruits, or masses Cardiac: RRR; no murmurs, rubs, or gallops,no edema  Respiratory:  clear to auscultation bilaterally, normal work of breathing GI: soft, nontender, nondistended, + BS MS: no deformity or atrophy  Skin: warm and dry Neuro:  Strength and sensation are intact Psych: euthymic mood, full affect  EKG:  EKG is ordered today. Personal review of the ekg ordered shows sinus rhythm, rate 56  Recent  Labs: 07/12/2015: Hemoglobin 15.3; Platelets 318 08/09/2015: BUN 19; Creat 1.08; Potassium 4.7; Sodium 138    Lipid Panel     Component Value Date/Time   CHOL 159 07/09/2013 0430   TRIG 89 07/09/2013 0430   HDL 42 07/09/2013 0430   CHOLHDL 3.8 07/09/2013 0430   VLDL 18 07/09/2013 0430   LDLCALC 99 07/09/2013 0430     Wt Readings from Last 3 Encounters:  03/24/16 250 lb (113.4 kg)  02/18/16 242 lb 12.8 oz (110.1 kg)  09/24/15 242 lb 1.9 oz (109.8 kg)      Other studies Reviewed: Additional studies/ records that were reviewed today include: TEE 07/11/13  Review of the above records today demonstrates:  - Left ventricle: Systolic function was normal. The estimated ejection fraction was in the range of 55% to 60%. Wall motion was normal; there were no regional wall motion abnormalities. - Aortic valve: Trivial regurgitation. - Mitral valve: Mild regurgitation. - Left atrium: The atrium was moderately dilated. No evidence of thrombus in the atrial cavity. There was a thrombus. Cannot exclude thrombus in the appendage. There was spontaneous echo contrast ("smoke"). - Right atrium: No evidence of thrombus in the atrial cavity or appendage.   ASSESSMENT AND PLAN:  1.  Persistent atrial fibrillation: Status post ablation, with 2 episodes of recurrence. I discussed with him the possibilities of medical management versus ablation. At this time, he does not want to take medicines due to side effects. I discussed with him the option of ablation including risks and benefits. Risks include bleeding, tamponade, stroke, heart block, and damage to surrounding organs. He understands these risks and has agreed to the procedure. He has had a previous ablation, therefore we Colbey Wirtanen need a preprocedure CT scan to further define his anatomy. We Khaila Velarde also start him on Zarontin O preprocedure.  2. OSA: compliant with CPAP  3. Hypertension: Well-controlled    Current medicines are  reviewed at length with the patient today.   The patient does not have concerns regarding his medicines.  The following changes were made today:  none  Labs/ tests ordered today include:  No orders of the defined types were placed in this encounter.    Disposition:   FU with Loris Winrow 3 months  Signed, Mariann Palo Meredith Leeds, MD  03/24/2016 3:40 PM     [facility] [address] [place] [address] [phone] (office) [phone] (fax)

## 2016-03-25 ENCOUNTER — Telehealth: Payer: Self-pay | Admitting: Cardiology

## 2016-03-25 DIAGNOSIS — I4819 Other persistent atrial fibrillation: Secondary | ICD-10-CM

## 2016-03-25 DIAGNOSIS — Z01812 Encounter for preprocedural laboratory examination: Secondary | ICD-10-CM

## 2016-03-25 NOTE — Telephone Encounter (Signed)
New Message  Pt voiced the procedure that's scheduled on 12.28.17 pt voiced he'd like for it to be moved to 12.14.17.  Please f/u with pt

## 2016-03-25 NOTE — Telephone Encounter (Signed)
Will schedule procedure for 12/14 and call him in the next few weeks to review instructions. Patient is agreeable to plan.

## 2016-03-27 NOTE — Addendum Note (Signed)
Addended by: Stanton Kidney on: 03/27/2016 10:15 AM   Modules accepted: Orders

## 2016-04-04 ENCOUNTER — Encounter: Payer: Self-pay | Admitting: Cardiology

## 2016-04-07 ENCOUNTER — Telehealth: Payer: Self-pay | Admitting: Cardiology

## 2016-04-07 NOTE — Telephone Encounter (Signed)
Records received from [facility] to Sherri/Camnitz

## 2016-04-08 NOTE — Progress Notes (Signed)
Received previous ablation procedure report.  Camnitz reviewed.  Procedure report scanned into chart.

## 2016-04-16 ENCOUNTER — Encounter: Payer: Self-pay | Admitting: Cardiology

## 2016-04-16 ENCOUNTER — Encounter: Payer: Self-pay | Admitting: Physician Assistant

## 2016-04-18 ENCOUNTER — Encounter: Payer: Self-pay | Admitting: *Deleted

## 2016-04-18 MED ORDER — RIVAROXABAN 20 MG PO TABS: 20.0000 mg | ORAL_TABLET | Freq: Every day | ORAL | 6 refills | Status: DC

## 2016-04-18 NOTE — Addendum Note (Signed)
Addended by: Stanton Kidney on: 04/18/2016 10:25 AM   Modules accepted: Orders

## 2016-04-18 NOTE — Telephone Encounter (Signed)
Reviewed ablation instructions with patient and letter send via [facility]. H&P/Pre procedure labs scheduled for 11/28.  He understands office will call to arrange pre procedure cardiac CT. Patient is currently taking Xarelto and will remain on this medication up to and after procedure.  Offered patient sooner ablation date of 12/7 -- he is going to discuss with his wife and let me know Monday.

## 2016-04-18 NOTE — Addendum Note (Signed)
Addended by: Stanton Kidney on: 04/18/2016 10:36 AM   Modules accepted: Orders

## 2016-04-23 ENCOUNTER — Encounter: Payer: Self-pay | Admitting: Cardiology

## 2016-04-24 ENCOUNTER — Encounter: Payer: Self-pay | Admitting: *Deleted

## 2016-04-25 ENCOUNTER — Other Ambulatory Visit: Payer: Self-pay | Admitting: *Deleted

## 2016-04-25 DIAGNOSIS — Z01812 Encounter for preprocedural laboratory examination: Secondary | ICD-10-CM

## 2016-04-25 DIAGNOSIS — Z79899 Other long term (current) drug therapy: Secondary | ICD-10-CM

## 2016-04-25 DIAGNOSIS — I480 Paroxysmal atrial fibrillation: Secondary | ICD-10-CM

## 2016-04-25 DIAGNOSIS — Z0000 Encounter for general adult medical examination without abnormal findings: Secondary | ICD-10-CM

## 2016-05-16 ENCOUNTER — Encounter: Payer: Self-pay | Admitting: Cardiology

## 2016-05-27 ENCOUNTER — Ambulatory Visit (INDEPENDENT_AMBULATORY_CARE_PROVIDER_SITE_OTHER): Payer: BLUE CROSS/BLUE SHIELD | Admitting: Cardiology

## 2016-05-27 ENCOUNTER — Encounter: Payer: Self-pay | Admitting: Cardiology

## 2016-05-27 VITALS — BP 130/82 | HR 62 | Ht 75.0 in | Wt 246.4 lb

## 2016-05-27 DIAGNOSIS — I480 Paroxysmal atrial fibrillation: Secondary | ICD-10-CM

## 2016-05-27 DIAGNOSIS — Z01812 Encounter for preprocedural laboratory examination: Secondary | ICD-10-CM

## 2016-05-27 DIAGNOSIS — I4819 Other persistent atrial fibrillation: Secondary | ICD-10-CM

## 2016-05-27 DIAGNOSIS — Z79899 Other long term (current) drug therapy: Secondary | ICD-10-CM | POA: Diagnosis not present

## 2016-05-27 DIAGNOSIS — I481 Persistent atrial fibrillation: Secondary | ICD-10-CM | POA: Diagnosis not present

## 2016-05-27 DIAGNOSIS — Z0000 Encounter for general adult medical examination without abnormal findings: Secondary | ICD-10-CM | POA: Diagnosis not present

## 2016-05-27 LAB — CBC WITH DIFFERENTIAL/PLATELET
BASOS ABS: 65 {cells}/uL (ref 0–200)
BASOS PCT: 1 %
EOS ABS: 715 {cells}/uL — AB (ref 15–500)
Eosinophils Relative: 11 %
HEMATOCRIT: 46.4 % (ref 38.5–50.0)
Hemoglobin: 15.2 g/dL (ref 13.2–17.1)
Lymphocytes Relative: 28 %
Lymphs Abs: 1820 cells/uL (ref 850–3900)
MCH: 28.7 pg (ref 27.0–33.0)
MCHC: 32.8 g/dL (ref 32.0–36.0)
MCV: 87.5 fL (ref 80.0–100.0)
MONO ABS: 715 {cells}/uL (ref 200–950)
MPV: 9.1 fL (ref 7.5–12.5)
Monocytes Relative: 11 %
NEUTROS ABS: 3185 {cells}/uL (ref 1500–7800)
Neutrophils Relative %: 49 %
Platelets: 333 10*3/uL (ref 140–400)
RBC: 5.3 MIL/uL (ref 4.20–5.80)
RDW: 13.8 % (ref 11.0–15.0)
WBC: 6.5 10*3/uL (ref 3.8–10.8)

## 2016-05-27 LAB — LIPID PANEL
Cholesterol: 177 mg/dL (ref <200)
HDL: 53 mg/dL (ref >40)
LDL CALC: 103 mg/dL — AB (ref <100)
Total CHOL/HDL Ratio: 3.3 Ratio (ref <5.0)
Triglycerides: 106 mg/dL (ref <150)
VLDL: 21 mg/dL (ref <30)

## 2016-05-27 LAB — URIC ACID: Uric Acid, Serum: 6.1 mg/dL (ref 4.0–8.0)

## 2016-05-27 LAB — COMPREHENSIVE METABOLIC PANEL
ALK PHOS: 79 U/L (ref 40–115)
ALT: 17 U/L (ref 9–46)
AST: 17 U/L (ref 10–35)
Albumin: 4.4 g/dL (ref 3.6–5.1)
BILIRUBIN TOTAL: 0.6 mg/dL (ref 0.2–1.2)
BUN: 22 mg/dL (ref 7–25)
CO2: 25 mmol/L (ref 20–31)
CREATININE: 1 mg/dL (ref 0.70–1.25)
Calcium: 9.2 mg/dL (ref 8.6–10.3)
Chloride: 105 mmol/L (ref 98–110)
GLUCOSE: 88 mg/dL (ref 65–99)
POTASSIUM: 4.2 mmol/L (ref 3.5–5.3)
SODIUM: 140 mmol/L (ref 135–146)
Total Protein: 7.1 g/dL (ref 6.1–8.1)

## 2016-05-27 LAB — TSH: TSH: 2.9 mIU/L (ref 0.40–4.50)

## 2016-05-27 LAB — BASIC METABOLIC PANEL
BUN: 22 mg/dL (ref 7–25)
CHLORIDE: 105 mmol/L (ref 98–110)
CO2: 25 mmol/L (ref 20–31)
CREATININE: 1.01 mg/dL (ref 0.70–1.25)
Calcium: 9.2 mg/dL (ref 8.6–10.3)
GLUCOSE: 88 mg/dL (ref 65–99)
Potassium: 4.3 mmol/L (ref 3.5–5.3)
Sodium: 140 mmol/L (ref 135–146)

## 2016-05-27 NOTE — Addendum Note (Signed)
Addended by: Eulis Foster on: 05/27/2016 08:31 AM   Modules accepted: Orders

## 2016-05-27 NOTE — Progress Notes (Signed)
Adrian Clark   Date:  05/27/2016   ID:  ITAMAR Clark, DOB 1956/04/09, MRN [id number]  PCP:  [name], MD  Cardiologist:  [name] Primary Electrophysiologist:  Maze Adrian Meredith Leeds, MD    Chief Complaint  Patient presents with  . Atrial Fibrillation     History of Present Illness: Adrian Clark is a 60 y.o. male who presents today for Adrian evaluation.   History of PAF s/pstatus post ablation in 2013 [facility] in [place], [place] Clark), OSA, HTN. Admitted in 1/15 with AF with RVR. He had LA thrombus on TEE. He eventually converted to NSR on his own.  Patient was back in atrial fibrillation 07/2015 CHADS2-VASc=1 (HTN). He was placed on Xarelto 20 mg QD with plans to proceed with DCCV after 4 weeks of uninterrupted anticoagulation. He underwent DCCV 08/20/15. Plan for repeat AF ablation 06/06/16. He has been having episodes of palpitations and headaches over the last few weeks. He feels that he is back in atrial fibrillation.   Today, he denies symptoms of chest pain, shortness of breath, orthopnea, PND, lower extremity edema, claudication, dizziness, presyncope, syncope, bleeding, or neurologic sequela. The patient is tolerating medications without difficulties and is otherwise without complaint today.    Past Medical History:  Diagnosis Date  . HTN (hypertension)   . OSA (obstructive sleep apnea)   . PAF (paroxysmal atrial fibrillation) ([place]) 2013   s/p ablation    Past Surgical History:  Procedure Laterality Date  . ABLATION     AFIB  . CARDIOVERSION N/A 07/11/2013   Procedure: CARDIOVERSION;  Surgeon: [name], MD;  Location: [facility];  Service: Cardiovascular;  Laterality: N/A;  . CARDIOVERSION N/A 08/20/2015   Procedure: CARDIOVERSION;  Surgeon: Adrian Klein, MD;  Location: [facility];  Service: Cardiovascular;  Laterality: N/A;  . CHOLECYSTECTOMY    . TEE WITHOUT CARDIOVERSION N/A 07/11/2013   Procedure:  TRANSESOPHAGEAL ECHOCARDIOGRAM (TEE);  Surgeon: [name], MD;  Location: [facility] ENDOSCOPY;  Service: Cardiovascular;  Laterality: N/A;  . TONSILLECTOMY    . VASECTOMY       Current Outpatient Prescriptions  Medication Sig Dispense Refill  . amLODipine-benazepril (LOTREL) 10-40 MG per capsule TAKE 1 CAPSULE BY MOUTH EVERY DAY 90 capsule 3  . metoprolol succinate (TOPROL-XL) 50 MG 24 hr tablet Take 50 mg by mouth daily. Take with or immediately following a meal.    . rivaroxaban (XARELTO) 20 MG TABS tablet Take 1 tablet (20 mg total) by mouth daily with supper. 30 tablet 6   No current facility-administered medications for this visit.     Allergies:   Patient has no known allergies.   Social History:  The patient  reports that he has never smoked. He has never used smokeless tobacco. He reports that he drinks about 1.2 oz of alcohol per week . He reports that he does not use drugs.   Family History:  The patient's family history includes Prostate cancer in his father; Uterine cancer in his mother.    ROS:  Please see the history of present illness.   Otherwise, review of systems is positive for palpitations, headaches.   All other systems are reviewed and negative.    PHYSICAL EXAM: VS:  BP 130/82   Pulse 62   Ht 6\' 3"  (1.905 m)   Wt 246 lb 6.4 oz (111.8 kg)   SpO2 99%   BMI 30.80 kg/m  , BMI Body mass index is 30.8 kg/m. GEN: Well nourished, well developed,  in no acute distress  HEENT: normal  Neck: no JVD, carotid bruits, or masses Cardiac: iRRR; no murmurs, rubs, or gallops,no edema  Respiratory:  clear to auscultation bilaterally, normal work of breathing GI: soft, nontender, nondistended, + BS MS: no deformity or atrophy  Skin: warm and dry Neuro:  Strength and sensation are intact Psych: euthymic mood, full affect  EKG:  EKG is not ordered today. Personal review of the ekg ordered 03/24/16 sinus rhythm, rate 56  Recent Labs: 07/12/2015: Hemoglobin 15.3; Platelets  318 08/09/2015: BUN 19; Creat 1.08; Potassium 4.7; Sodium 138    Lipid Panel     Component Value Date/Time   CHOL 159 07/09/2013 0430   TRIG 89 07/09/2013 0430   HDL 42 07/09/2013 0430   CHOLHDL 3.8 07/09/2013 0430   VLDL 18 07/09/2013 0430   LDLCALC 99 07/09/2013 0430     Wt Readings from Last 3 Encounters:  05/27/16 246 lb 6.4 oz (111.8 kg)  03/24/16 250 lb (113.4 kg)  02/18/16 242 lb 12.8 oz (110.1 kg)      Other studies Reviewed: Additional studies/ records that were reviewed today include: TEE 07/11/13  Review of the above records today demonstrates:  - Left ventricle: Systolic function was normal. The estimated ejection fraction was in the range of 55% to 60%. Wall motion was normal; there were no regional wall motion abnormalities. - Aortic valve: Trivial regurgitation. - Mitral valve: Mild regurgitation. - Left atrium: The atrium was moderately dilated. No evidence of thrombus in the atrial cavity. There was a thrombus. Cannot exclude thrombus in the appendage. There was spontaneous echo contrast ("smoke"). - Right atrium: No evidence of thrombus in the atrial cavity or appendage.   ASSESSMENT AND PLAN:  1.  Persistent atrial fibrillation: Status post ablation, with 2 episodes of recurrence. Plan for repeat ablation 06/06/16. I discussed with him the option of ablation including risks and benefits. Risks include bleeding, tamponade, stroke, heart block, and damage to surrounding organs. He understands these risks and has agreed to the procedure. He has had a previous ablation, therefore we Morad Tal need a preprocedure CT scan to further define his anatomy. Kriya Westra check labs today.  This patients CHA2DS2-VASc Score and unadjusted Ischemic Stroke Rate (% per year) is equal to 0.6 % stroke rate/year from a score of 1  Above score calculated as 1 point each if present [CHF, HTN, DM, Vascular=MI/PAD/Aortic Plaque, Age if 65-74, or Male] Above score calculated  as 2 points each if present [Age > 75, or Stroke/TIA/TE]  2. OSA: compliant with CPAP  3. Hypertension: Well-controlled    Current medicines are reviewed at length with the patient today.   The patient does not have concerns regarding his medicines.  The following changes were made today:  none  Labs/ tests ordered today include:  No orders of the defined types were placed in this encounter.    Disposition:   FU with Ursula Dermody 3 months  Signed, Oaklan Persons Meredith Leeds, MD  05/27/2016 7:58 AM     [facility] [address] [place] [address] [phone] (office) [phone] (fax)

## 2016-05-27 NOTE — Addendum Note (Signed)
Addended by: Eulis Foster on: 05/27/2016 08:44 AM   Modules accepted: Orders

## 2016-05-27 NOTE — Patient Instructions (Signed)
Medication Instructions:  Your physician recommends that you continue on your current medications as directed. Please refer to the Current Medication list given to you today.  Labwork: Pre procedure labs today  Testing/Procedures: None ordered.  Keep scheduled ablation procedure on 06/06/16  Follow-Up: Your physician recommends that you schedule a follow-up appointment in: 4 weeks, after your procedure on 06/06/16, with Roderic Palau, NP in the AFib clinic.  Your physician recommends that you schedule a follow-up appointment in: 3 months, after your procedure on 06/06/16, with Dr. Curt Bears   If you need a refill on your cardiac medications before your next appointment, please call your pharmacy.  Thank you for choosing [facility]!!   Trinidad Curet, RN [phone]

## 2016-05-28 LAB — PSA: PSA: 2.4 ng/mL (ref <=4.0)

## 2016-05-29 ENCOUNTER — Encounter: Payer: Self-pay | Admitting: Cardiology

## 2016-05-30 ENCOUNTER — Ambulatory Visit (HOSPITAL_COMMUNITY)
Admission: RE | Admit: 2016-05-30 | Discharge: 2016-05-30 | Disposition: A | Discharge status: Home / Self Care | Payer: BLUE CROSS/BLUE SHIELD | Source: Ambulatory Visit | Attending: Cardiology | Admitting: Cardiology

## 2016-05-30 ENCOUNTER — Encounter (HOSPITAL_COMMUNITY): Payer: Self-pay

## 2016-05-30 DIAGNOSIS — I7789 Other specified disorders of arteries and arterioles: Secondary | ICD-10-CM | POA: Diagnosis not present

## 2016-05-30 DIAGNOSIS — I517 Cardiomegaly: Secondary | ICD-10-CM | POA: Insufficient documentation

## 2016-05-30 DIAGNOSIS — I481 Persistent atrial fibrillation: Secondary | ICD-10-CM | POA: Insufficient documentation

## 2016-05-30 DIAGNOSIS — I4891 Unspecified atrial fibrillation: Secondary | ICD-10-CM | POA: Diagnosis not present

## 2016-05-30 DIAGNOSIS — I4819 Other persistent atrial fibrillation: Secondary | ICD-10-CM

## 2016-05-30 MED ORDER — IOPAMIDOL (ISOVUE-370) INJECTION 76%
INTRAVENOUS | Status: AC
  Administered 2016-05-30: 80 mL
  Filled 2016-05-30: qty 100

## 2016-06-06 ENCOUNTER — Encounter (HOSPITAL_COMMUNITY): Payer: Self-pay | Admitting: Certified Registered Nurse Anesthetist

## 2016-06-06 ENCOUNTER — Ambulatory Visit (HOSPITAL_COMMUNITY)
Admission: RE | Admit: 2016-06-06 | Discharge: 2016-06-07 | Disposition: A | Discharge status: Home / Self Care | Payer: BLUE CROSS/BLUE SHIELD | Source: Ambulatory Visit | Attending: Cardiology | Admitting: Cardiology

## 2016-06-06 ENCOUNTER — Ambulatory Visit (HOSPITAL_COMMUNITY): Payer: BLUE CROSS/BLUE SHIELD | Admitting: Certified Registered Nurse Anesthetist

## 2016-06-06 ENCOUNTER — Encounter (HOSPITAL_COMMUNITY)
Admission: RE | Disposition: A | Discharge status: Home / Self Care | Payer: Self-pay | Source: Ambulatory Visit | Attending: Cardiology

## 2016-06-06 DIAGNOSIS — I10 Essential (primary) hypertension: Secondary | ICD-10-CM | POA: Insufficient documentation

## 2016-06-06 DIAGNOSIS — I4819 Other persistent atrial fibrillation: Secondary | ICD-10-CM

## 2016-06-06 DIAGNOSIS — I481 Persistent atrial fibrillation: Secondary | ICD-10-CM | POA: Insufficient documentation

## 2016-06-06 DIAGNOSIS — G4733 Obstructive sleep apnea (adult) (pediatric): Secondary | ICD-10-CM | POA: Diagnosis not present

## 2016-06-06 DIAGNOSIS — Z7901 Long term (current) use of anticoagulants: Secondary | ICD-10-CM | POA: Insufficient documentation

## 2016-06-06 DIAGNOSIS — I4891 Unspecified atrial fibrillation: Secondary | ICD-10-CM | POA: Diagnosis present

## 2016-06-06 HISTORY — PX: ELECTROPHYSIOLOGIC STUDY: SHX172A

## 2016-06-06 LAB — POCT ACTIVATED CLOTTING TIME
ACTIVATED CLOTTING TIME: 301 s
Activated Clotting Time: 186 seconds

## 2016-06-06 LAB — MRSA PCR SCREENING: MRSA by PCR: NEGATIVE

## 2016-06-06 SURGERY — ATRIAL FIBRILLATION ABLATION
Anesthesia: General

## 2016-06-06 MED ORDER — METOPROLOL SUCCINATE ER 50 MG PO TB24
50.0000 mg | ORAL_TABLET | Freq: Every day | ORAL | Status: DC
  Administered 2016-06-07: 50 mg via ORAL
  Filled 2016-06-06: qty 1

## 2016-06-06 MED ORDER — ACETAMINOPHEN 325 MG PO TABS: 650.0000 mg | ORAL_TABLET | ORAL | Status: DC | PRN

## 2016-06-06 MED ORDER — HEPARIN SODIUM (PORCINE) 1000 UNIT/ML IJ SOLN
INTRAMUSCULAR | Status: DC | PRN
  Administered 2016-06-06: 1000 [IU] via INTRAVENOUS

## 2016-06-06 MED ORDER — MIDAZOLAM HCL 5 MG/5ML IJ SOLN
INTRAMUSCULAR | Status: DC | PRN
  Administered 2016-06-06: 2 mg via INTRAVENOUS

## 2016-06-06 MED ORDER — BUPIVACAINE HCL (PF) 0.25 % IJ SOLN
INTRAMUSCULAR | Status: AC
  Filled 2016-06-06: qty 60

## 2016-06-06 MED ORDER — SODIUM CHLORIDE 0.9% FLUSH: 3.0000 mL | INTRAVENOUS | Status: DC | PRN

## 2016-06-06 MED ORDER — HEPARIN SODIUM (PORCINE) 1000 UNIT/ML IJ SOLN
INTRAMUSCULAR | Status: DC | PRN
  Administered 2016-06-06: 3000 [IU] via INTRAVENOUS
  Administered 2016-06-06: 13000 [IU] via INTRAVENOUS
  Administered 2016-06-06: 1000 [IU] via INTRAVENOUS
  Administered 2016-06-06: 2000 [IU] via INTRAVENOUS
  Administered 2016-06-06: 4000 [IU] via INTRAVENOUS
  Administered 2016-06-06: 2000 [IU] via INTRAVENOUS
  Administered 2016-06-06: 6000 [IU] via INTRAVENOUS

## 2016-06-06 MED ORDER — SODIUM CHLORIDE 0.9 % IV SOLN: 250.0000 mL | INTRAVENOUS | Status: DC | PRN

## 2016-06-06 MED ORDER — ONDANSETRON HCL 4 MG/2ML IJ SOLN
INTRAMUSCULAR | Status: DC | PRN
  Administered 2016-06-06: 4 mg via INTRAVENOUS

## 2016-06-06 MED ORDER — BUPIVACAINE HCL (PF) 0.25 % IJ SOLN
INTRAMUSCULAR | Status: DC | PRN
Start: 2016-06-06 — End: 2016-06-06
  Administered 2016-06-06: 60 mL

## 2016-06-06 MED ORDER — FENTANYL CITRATE (PF) 100 MCG/2ML IJ SOLN
INTRAMUSCULAR | Status: DC | PRN
  Administered 2016-06-06: 100 ug via INTRAVENOUS
  Administered 2016-06-06 (×4): 50 ug via INTRAVENOUS

## 2016-06-06 MED ORDER — PHENYLEPHRINE HCL 10 MG/ML IJ SOLN
INTRAMUSCULAR | Status: DC | PRN
  Administered 2016-06-06: 80 ug via INTRAVENOUS
  Administered 2016-06-06: 160 ug via INTRAVENOUS

## 2016-06-06 MED ORDER — LIDOCAINE HCL (CARDIAC) 20 MG/ML IV SOLN
INTRAVENOUS | Status: DC | PRN
  Administered 2016-06-06: 60 mg via INTRAVENOUS

## 2016-06-06 MED ORDER — PROTAMINE SULFATE 10 MG/ML IV SOLN
INTRAVENOUS | Status: DC | PRN
  Administered 2016-06-06: 40 mg via INTRAVENOUS

## 2016-06-06 MED ORDER — SODIUM CHLORIDE 0.9% FLUSH
3.0000 mL | Freq: Two times a day (BID) | INTRAVENOUS | Status: DC
  Administered 2016-06-06 – 2016-06-07 (×2): 3 mL via INTRAVENOUS

## 2016-06-06 MED ORDER — ROCURONIUM BROMIDE 100 MG/10ML IV SOLN
INTRAVENOUS | Status: DC | PRN
  Administered 2016-06-06: 50 mg via INTRAVENOUS
  Administered 2016-06-06: 10 mg via INTRAVENOUS

## 2016-06-06 MED ORDER — HYDROMORPHONE HCL 1 MG/ML IJ SOLN
0.2500 mg | INTRAMUSCULAR | Status: DC | PRN
Start: 2016-06-06 — End: 2016-06-06

## 2016-06-06 MED ORDER — HEPARIN (PORCINE) IN NACL 2-0.9 UNIT/ML-% IJ SOLN
INTRAMUSCULAR | Status: DC | PRN
  Administered 2016-06-06: 08:00:00

## 2016-06-06 MED ORDER — PHENYLEPHRINE HCL 10 MG/ML IJ SOLN
INTRAVENOUS | Status: DC | PRN
  Administered 2016-06-06: 40 ug/min via INTRAVENOUS

## 2016-06-06 MED ORDER — ONDANSETRON HCL 4 MG/2ML IJ SOLN: 4.0000 mg | Freq: Four times a day (QID) | INTRAMUSCULAR | Status: DC | PRN

## 2016-06-06 MED ORDER — PROPOFOL 10 MG/ML IV BOLUS
INTRAVENOUS | Status: DC | PRN
  Administered 2016-06-06: 150 mg via INTRAVENOUS

## 2016-06-06 MED ORDER — LACTATED RINGERS IV SOLN
INTRAVENOUS | Status: DC | PRN
  Administered 2016-06-06 (×2): via INTRAVENOUS

## 2016-06-06 MED ORDER — HEPARIN SODIUM (PORCINE) 1000 UNIT/ML IJ SOLN
INTRAMUSCULAR | Status: AC
  Filled 2016-06-06: qty 1

## 2016-06-06 MED ORDER — RIVAROXABAN 20 MG PO TABS
20.0000 mg | ORAL_TABLET | Freq: Every day | ORAL | Status: DC
  Administered 2016-06-06: 20 mg via ORAL
  Filled 2016-06-06: qty 1

## 2016-06-06 MED ORDER — SUGAMMADEX SODIUM 200 MG/2ML IV SOLN
INTRAVENOUS | Status: DC | PRN
  Administered 2016-06-06: 200 mg via INTRAVENOUS

## 2016-06-06 SURGICAL SUPPLY — 20 items
BAG SNAP BAND KOVER 36X36 (MISCELLANEOUS) ×2 IMPLANT
BLANKET WARM UNDERBOD FULL ACC (MISCELLANEOUS) ×2 IMPLANT
CATH SMTCH THERMOCOOL SF DF (CATHETERS) ×1 IMPLANT
CATH SOUNDSTAR 3D IMAGING (CATHETERS) ×1 IMPLANT
CATH VARIABLE LASSO NAV 2515 (CATHETERS) ×1 IMPLANT
CATH WEBSTER BI DIR CS D-F CRV (CATHETERS) ×1 IMPLANT
COVER SWIFTLINK CONNECTOR (BAG) ×2 IMPLANT
NDL TRANSSEPTAL BRK 98CM (NEEDLE) IMPLANT
NEEDLE TRANSSEPTAL BRK 98CM (NEEDLE) ×2 IMPLANT
PACK EP LATEX FREE (CUSTOM PROCEDURE TRAY) ×2
PACK EP LF (CUSTOM PROCEDURE TRAY) ×1 IMPLANT
PAD DEFIB LIFELINK (PAD) ×2 IMPLANT
PATCH CARTO3 (PAD) ×1 IMPLANT
SHEATH AGILIS NXT 8.5F 71CM (SHEATH) ×2 IMPLANT
SHEATH AVANTI 11F 11CM (SHEATH) ×1 IMPLANT
SHEATH PINNACLE 7F 10CM (SHEATH) ×1 IMPLANT
SHEATH PINNACLE 8F 10CM (SHEATH) ×2 IMPLANT
SHEATH PINNACLE 9F 10CM (SHEATH) ×2 IMPLANT
SHIELD RADPAD SCOOP 12X17 (MISCELLANEOUS) ×2 IMPLANT
TUBING SMART ABLATE COOLFLOW (TUBING) ×1 IMPLANT

## 2016-06-06 NOTE — Progress Notes (Addendum)
Site area: RFV x 2 Site Prior to Removal:  Level 0 Pressure Applied For:45 min Manual:   yes Patient Status During Pull:  stable Post Pull Site:  Level 0 Post Pull Instructions Given:  yes Post Pull Pulses Present: palpable Dressing Applied: tegaderm  Bedrest begins @  See LFV note Comments:

## 2016-06-06 NOTE — Anesthesia Procedure Notes (Signed)
Procedure Name: Intubation Date/Time: 06/06/2016 8:32 AM Performed by: Shirlyn Goltz Pre-anesthesia Checklist: Patient identified, Suction available, Emergency Drugs available and Patient being monitored Patient Re-evaluated:Patient Re-evaluated prior to inductionOxygen Delivery Method: Circle system utilized Preoxygenation: Pre-oxygenation with 100% oxygen Intubation Type: IV induction Ventilation: Mask ventilation without difficulty Tube type: Oral Tube size: 7.5 mm Number of attempts: 2 Airway Equipment and Method: Stylet Placement Confirmation: ETT inserted through vocal cords under direct vision,  positive ETCO2 and breath sounds checked- equal and bilateral Secured at: 23 cm Tube secured with: Tape Dental Injury: Teeth and Oropharynx as per pre-operative assessment  Difficulty Due To: Difficulty was anticipated, Difficult Airway- due to anterior larynx and Difficult Airway- due to reduced neck mobility Comments: DL #1: grade 4 mac 3 DL #2: grade 2 mcgrath 4

## 2016-06-06 NOTE — Anesthesia Preprocedure Evaluation (Signed)
Anesthesia Evaluation  Patient identified by MRN, date of birth, ID band Patient awake    Reviewed: Allergy & Precautions, H&P , NPO status , Patient's Chart, lab work & pertinent test results, reviewed documented beta blocker date and time   Airway Mallampati: II  TM Distance: >3 FB Neck ROM: Full    Dental no notable dental hx. (+) Teeth Intact, Dental Advisory Given   Pulmonary sleep apnea and Continuous Positive Airway Pressure Ventilation ,    Pulmonary exam normal breath sounds clear to auscultation       Cardiovascular hypertension, Pt. on medications and Pt. on home beta blockers + dysrhythmias Atrial Fibrillation  Rhythm:Regular Rate:Normal     Neuro/Psych negative neurological ROS  negative psych ROS   GI/Hepatic negative GI ROS, Neg liver ROS,   Endo/Other  negative endocrine ROS  Renal/GU negative Renal ROS  negative genitourinary   Musculoskeletal   Abdominal   Peds  Hematology negative hematology ROS (+)   Anesthesia Other Findings   Reproductive/Obstetrics negative OB ROS                             Anesthesia Physical Anesthesia Plan  ASA: III  Anesthesia Plan: General   Post-op Pain Management:    Induction: Intravenous  Airway Management Planned: LMA  Additional Equipment:   Intra-op Plan:   Post-operative Plan: Extubation in OR  Informed Consent: I have reviewed the patients History and Physical, chart, labs and discussed the procedure including the risks, benefits and alternatives for the proposed anesthesia with the patient or authorized representative who has indicated his/her understanding and acceptance.   Dental advisory given  Plan Discussed with: CRNA  Anesthesia Plan Comments:         Anesthesia Quick Evaluation

## 2016-06-06 NOTE — Anesthesia Postprocedure Evaluation (Signed)
Anesthesia Post Note  Patient: Adrian Clark  Procedure(s) Performed: Procedure(s) (LRB): Atrial Fibrillation Ablation (N/A)  Patient location during evaluation: PACU Anesthesia Type: General Level of consciousness: awake and alert Pain management: pain level controlled Vital Signs Assessment: post-procedure vital signs reviewed and stable Respiratory status: spontaneous breathing, nonlabored ventilation and respiratory function stable Cardiovascular status: blood pressure returned to baseline and stable Postop Assessment: no signs of nausea or vomiting Anesthetic complications: no    Last Vitals:  Vitals:   06/06/16 1400 06/06/16 1405  BP: 108/81 106/78  Pulse: 63 63  Resp: 18 13  Temp:      Last Pain:  Vitals:   06/06/16 0618  TempSrc: Oral                 Tomasita Beevers,W. EDMOND

## 2016-06-06 NOTE — H&P (Signed)
Adrian Clark is a 60 y.o. male with a history of atrial fibrillation.  He had a previous ablation in 2013 with PVI. He presents today for AF ablation.  On exam, iRRR, no murmurs, lungs clear.  Risks and benefits discussed.  Risks include but not limited to bleeding, tamponade, heart block, stroke, and damage to surrounding organs. He understands these risks and has agreed to the procedure.  [name], MD 06/06/2016 7:15 AM

## 2016-06-06 NOTE — Transfer of Care (Signed)
Immediate Anesthesia Transfer of Care Note  Patient: Adrian Clark  Procedure(s) Performed: Procedure(s): Atrial Fibrillation Ablation (N/A)  Patient Location: PACU and Cath Lab  Anesthesia Type:General  Level of Consciousness: awake, alert , oriented and patient cooperative  Airway & Oxygen Therapy: Patient Spontanous Breathing and Patient connected to nasal cannula oxygen  Post-op Assessment: Report given to RN and Post -op Vital signs reviewed and stable  Post vital signs: Reviewed and stable  Last Vitals:  Vitals:   06/06/16 0618  BP: (!) 139/108  Pulse: (!) 50  Resp: 18  Temp: 36.4 C    Last Pain:  Vitals:   06/06/16 0618  TempSrc: Oral      Patients Stated Pain Goal: 4 ([id number])  Complications: No apparent anesthesia complications

## 2016-06-06 NOTE — Discharge Summary (Signed)
ELECTROPHYSIOLOGY PROCEDURE DISCHARGE SUMMARY    Patient ID: Adrian Clark,  MRN: [id number], DOB/AGE: [birth year] 60 y.o.  Admit date: 06/06/2016 Discharge date: 06/06/16  Primary Care Physician: [name], MD  Primary Cardiologist: Dr. [name] Electrophysiologist: Dr. Curt Bears   Primary Discharge Diagnosis:  1. Persistent AFib     CHA2DS2Vasc is at least 1, on Xarelto  Secondary Discharge Diagnosis:  1. OSA     Compliant with CPAP 2. HTN  Procedures This Admission:  1.  Electrophysiology study and radiofrequency catheter ablation on 06/06/16 by Dr Curt Bears. This study demonstrated:  CONCLUSIONS: 1. Atrial fibrillation upon presentation.   2. Successful electrical isolation and anatomical encircling of all three of four pulmonary veins with radiofrequency current.  The RSPV could not be fully isolated   Brief HPI: Adrian Clark is a 60 y.o. male with a history of persistent atrial fibrillation.  Risks, benefits, and alternatives to catheter ablation of atrial fibrillation were reviewed with the patient who wished to proceed.  The patient underwent pre-procedure cardiac CT demonstrated no LAA thrombus.    Hospital Course:  The patient was admitted and underwent EPS/RFCA of atrial fibrillation with details as outlined above.  He was monitored on telemetry overnight which demonstrated sinus rhythm.  Groin was without complication on the day of discharge.  The patient was examined by Dr. Rayann Heman and considered to be stable for discharge.  Wound care and restrictions were reviewed with the patient.  The patient will be seen back by Roderic Palau, NP in 4 weeks and Dr. Curt Bears in 12 weeks for post ablation follow up.    Physical Exam: Vitals:   06/07/16 0300 06/07/16 0400 06/07/16 0500 06/07/16 0826  BP: 121/84 117/82 126/89 122/85  Pulse:      Resp:    18  Temp:    98.5 F (36.9 C)  TempSrc:    Axillary  SpO2: 94% 94% 94% 94%  Weight:      Height:        GEN- The  patient is well appearing, alert and oriented x 3 today.   HEENT: normocephalic, atraumatic; sclera clear, conjunctiva pink; hearing intact; oropharynx clear; neck supple  Lungs- Clear to ausculation bilaterally, normal work of breathing.  No wheezes, rales, rhonchi Heart- Regular rate and rhythm, no murmurs, rubs or gallops  GI- soft, non-tender, non-distended, bowel sounds present  Extremities- no clubbing, cyanosis, or edema; DP/PT/radial pulses 2+ bilaterally, groin without hematoma/bruit MS- no significant deformity or atrophy Skin- warm and dry, no rash or lesion Psych- euthymic mood, full affect Neuro- strength and sensation are intact   Labs:   Lab Results  Component Value Date   WBC 6.5 05/27/2016   HGB 15.2 05/27/2016   HCT 46.4 05/27/2016   MCV 87.5 05/27/2016   PLT 333 05/27/2016   No results for input(s): NA, K, CL, CO2, BUN, CREATININE, CALCIUM, PROT, BILITOT, ALKPHOS, ALT, AST, GLUCOSE in the last 168 hours.  Invalid input(s): LABALBU   Discharge Medications:    Medication List    TAKE these medications   amLODipine-benazepril 10-40 MG capsule Commonly known as:  LOTREL TAKE 1 CAPSULE BY MOUTH EVERY DAY   metoprolol succinate 50 MG 24 hr tablet Commonly known as:  TOPROL-XL Take 50 mg by mouth daily. Take with or immediately following a meal.   rivaroxaban 20 MG Tabs tablet Commonly known as:  XARELTO Take 1 tablet (20 mg total) by mouth daily with supper.       Disposition:  Home  Follow-up Information    [facility] Follow up on 07/04/2016.   Specialty:  Cardiology Why:  8:30AM Contact information: [address] [id number] [place] [phone]       Will Meredith Leeds, MD Follow up on 09/02/2016.   Specialty:  Cardiology Why:  4:00PM  Contact information: [address] [place] [address] [phone]           Duration of Discharge Encounter: Greater than  30 minutes including physician time.  Army Fossa MD 06/07/2016 9:48 AM

## 2016-06-06 NOTE — Progress Notes (Addendum)
Site area: LFV x 2 Site Prior to Removal:  Level 0 Pressure Applied For:20 min Manual: yes   Patient Status During Pull: stable  Post Pull Site:  Level 0 Post Pull Instructions Given: yes  Post Pull Pulses Present: palpable Dressing Applied:  tegaderm Bedrest begins @ [id number] till 2130 Comments:

## 2016-06-06 NOTE — Discharge Instructions (Signed)
No driving for 1 week. No lifting over 5 lbs for 1 week. No vigorous or sexual activity for 1 week. You may return to work on 06/13/16. Keep procedure site clean & dry. If you notice increased pain, swelling, bleeding or pus, call/return!  You may shower, but no soaking baths/hot tubs/pools for 1 week.     You have an appointment set up with the [facility].  Multiple studies have shown that being followed by a dedicated atrial fibrillation clinic in addition to the standard care you receive from your other physicians improves health. We believe that enrollment in the atrial fibrillation clinic will allow [place] to better care for you.   The phone number to the [facility] is [phone]. The clinic is staffed Monday through Friday from 8:30am to 5pm.  Parking Directions: The clinic is located in the Heart and Vascular Building connected to [facility]. 1)From [address] turn on to [address] and go to the 3rd entrance  (Heart and Vascular entrance) on the right. 2)Look to the right for Heart &Vascular Parking Garage. 3)A code for the entrance is required please call the clinic to receive this.   4)Take the elevators to the 1st floor. Registration is in the room with the glass walls at the end of the hallway.  If you have any trouble parking or locating the clinic, please dont hesitate to call [phone].

## 2016-06-07 ENCOUNTER — Other Ambulatory Visit: Payer: Self-pay

## 2016-06-07 DIAGNOSIS — I481 Persistent atrial fibrillation: Secondary | ICD-10-CM

## 2016-06-07 DIAGNOSIS — I4819 Other persistent atrial fibrillation: Secondary | ICD-10-CM

## 2016-06-07 DIAGNOSIS — G4733 Obstructive sleep apnea (adult) (pediatric): Secondary | ICD-10-CM | POA: Diagnosis not present

## 2016-06-07 DIAGNOSIS — Z7901 Long term (current) use of anticoagulants: Secondary | ICD-10-CM | POA: Diagnosis not present

## 2016-06-07 DIAGNOSIS — I10 Essential (primary) hypertension: Secondary | ICD-10-CM | POA: Diagnosis not present

## 2016-06-07 NOTE — Progress Notes (Signed)
Discharge instructions given. Pt verbalized understanding and all questions were answered.  

## 2016-06-08 ENCOUNTER — Encounter: Payer: Self-pay | Admitting: Cardiology

## 2016-06-09 ENCOUNTER — Encounter (HOSPITAL_COMMUNITY): Payer: Self-pay | Admitting: Cardiology

## 2016-06-09 LAB — POCT ACTIVATED CLOTTING TIME
ACTIVATED CLOTTING TIME: 257 s
ACTIVATED CLOTTING TIME: 296 s
ACTIVATED CLOTTING TIME: 301 s
Activated Clotting Time: 318 seconds
Activated Clotting Time: 290 seconds

## 2016-06-10 ENCOUNTER — Telehealth: Payer: Self-pay | Admitting: Cardiology

## 2016-06-10 DIAGNOSIS — I4819 Other persistent atrial fibrillation: Secondary | ICD-10-CM

## 2016-06-10 DIAGNOSIS — Z79899 Other long term (current) drug therapy: Secondary | ICD-10-CM

## 2016-06-10 NOTE — Telephone Encounter (Signed)
New message       Pt recently had a cardioversion.  He states that he is now back in AFIB.  Please call

## 2016-06-10 NOTE — Telephone Encounter (Signed)
Returned patient call.  Patient states that he is back in a-fib after ablation on 06-06-16.  States that he is asymptomatic.  States that if he needs any meds called in please call to [place].

## 2016-06-11 ENCOUNTER — Encounter: Payer: Self-pay | Admitting: Cardiology

## 2016-06-11 NOTE — Telephone Encounter (Signed)
Follow Up   Adrian Clark is callling in reference to her husband Adrian Clark . Stating that he is still in afib and is needing to speak to someone . Please call

## 2016-06-11 NOTE — Telephone Encounter (Signed)
Called patient back. Patient stated he has been having A. Fib with heart rate in 90's, and with activity patient stated it goes up to 110's. Patient stated he has no other symptoms besides being a little dizzy this morning, possibly due to standing up to quickly. Will forward to Dr. Curt Bears and Venida Jarvis his nurse for advisement.

## 2016-06-11 NOTE — Telephone Encounter (Signed)
Patient reports being in Afib conitnuously for almost 24 hours.  Asymptomatic.  Resting HR 60s.  Activity HR 110s (activity = walking in house). Advised patient it is normal to experience "breakthrough" Afib post ablation.  Advised to contact office if he remains in Afib for > 48hr or becomes symptomatic.  Pt is agreeable to plan. Informed that I would forward to Dr. Curt Bears for review/advisement. Pt understands it may be tomorrow before hearing back from me.

## 2016-06-13 MED ORDER — FLECAINIDE ACETATE 150 MG PO TABS: 75.0000 mg | ORAL_TABLET | Freq: Two times a day (BID) | ORAL | 3 refills | Status: DC

## 2016-06-13 NOTE — Telephone Encounter (Signed)
Still experiencing AFib. Reviewed w/ Dr. Curt Bears - order to start Flecainide. Pt is agreeable.  He understands office will contact him to arrange GXT testing and then I will instruct him on when to begin Flecainide.  He reports BP at 118/76 at the current moment, but rises overnight.  States daytime numbers are fine. But early morning numbers have been high.  Will call and advise pt to split Lotrel to 1/2 in morning and 1/2 in the evening to see if this makes a difference in early morning numbers.

## 2016-06-13 NOTE — Telephone Encounter (Signed)
Instructed to start flecainide tomorrow.  GXT scheduled for 12/26.  Lotrel cannot be split -- it is capsule.  Pt going to split Toprol and see if this helps.  I will follow up with him next week.  He thanks me for all my help

## 2016-06-18 ENCOUNTER — Encounter: Payer: Self-pay | Admitting: Cardiology

## 2016-06-18 NOTE — Telephone Encounter (Signed)
Discussed with Dr. Curt Bears - order for DCCV. Scheduled for 06/20/16.  Pt to arrive at hospital at 12:30 pm for 2pm case.   Pre procedure labs to be done in the hospital before procedure. Reviewed instructions with pt whom verbalized understanding.  I will call him next week to see how he is doing.

## 2016-06-19 ENCOUNTER — Encounter: Payer: Self-pay | Admitting: Cardiology

## 2016-06-19 ENCOUNTER — Other Ambulatory Visit: Payer: Self-pay | Admitting: Cardiology

## 2016-06-19 DIAGNOSIS — G4733 Obstructive sleep apnea (adult) (pediatric): Secondary | ICD-10-CM | POA: Diagnosis not present

## 2016-06-20 ENCOUNTER — Ambulatory Visit (HOSPITAL_COMMUNITY): Payer: BLUE CROSS/BLUE SHIELD | Admitting: Anesthesiology

## 2016-06-20 ENCOUNTER — Ambulatory Visit (HOSPITAL_COMMUNITY)
Admission: RE | Admit: 2016-06-20 | Discharge: 2016-06-20 | Disposition: A | Discharge status: Home / Self Care | Payer: BLUE CROSS/BLUE SHIELD | Source: Ambulatory Visit | Attending: Cardiology | Admitting: Cardiology

## 2016-06-20 ENCOUNTER — Encounter (HOSPITAL_COMMUNITY): Payer: Self-pay | Admitting: *Deleted

## 2016-06-20 ENCOUNTER — Encounter (HOSPITAL_COMMUNITY)
Admission: RE | Disposition: A | Discharge status: Home / Self Care | Payer: Self-pay | Source: Ambulatory Visit | Attending: Cardiology

## 2016-06-20 DIAGNOSIS — I4891 Unspecified atrial fibrillation: Secondary | ICD-10-CM | POA: Diagnosis not present

## 2016-06-20 DIAGNOSIS — I10 Essential (primary) hypertension: Secondary | ICD-10-CM | POA: Insufficient documentation

## 2016-06-20 DIAGNOSIS — G4730 Sleep apnea, unspecified: Secondary | ICD-10-CM | POA: Diagnosis not present

## 2016-06-20 DIAGNOSIS — I4892 Unspecified atrial flutter: Secondary | ICD-10-CM | POA: Diagnosis not present

## 2016-06-20 HISTORY — PX: CARDIOVERSION: SHX1299

## 2016-06-20 LAB — POCT I-STAT 4, (NA,K, GLUC, HGB,HCT)
Glucose, Bld: 96 mg/dL (ref 65–99)
HCT: 44 % (ref 39.0–52.0)
Hemoglobin: 15 g/dL (ref 13.0–17.0)
Potassium: 4.1 mmol/L (ref 3.5–5.1)
Sodium: 138 mmol/L (ref 135–145)

## 2016-06-20 SURGERY — CARDIOVERSION
Anesthesia: General

## 2016-06-20 MED ORDER — LIDOCAINE 2% (20 MG/ML) 5 ML SYRINGE
INTRAMUSCULAR | Status: DC | PRN
  Administered 2016-06-20: 100 mg via INTRAVENOUS

## 2016-06-20 MED ORDER — PROPOFOL 10 MG/ML IV BOLUS
INTRAVENOUS | Status: DC | PRN
  Administered 2016-06-20: 70 mg via INTRAVENOUS
  Administered 2016-06-20: 80 mg via INTRAVENOUS

## 2016-06-20 MED ORDER — SODIUM CHLORIDE 0.9 % IV SOLN
INTRAVENOUS | Status: DC
  Administered 2016-06-20 (×2): via INTRAVENOUS

## 2016-06-20 NOTE — Anesthesia Procedure Notes (Signed)
Procedure Name: MAC Date/Time: 06/20/2016 2:12 PM Performed by: Everlean Cherry A Pre-anesthesia Checklist: Patient identified, Emergency Drugs available, Suction available and Patient being monitored Patient Re-evaluated:Patient Re-evaluated prior to inductionOxygen Delivery Method: Ambu bag Preoxygenation: Pre-oxygenation with 100% oxygen

## 2016-06-20 NOTE — Transfer of Care (Signed)
Immediate Anesthesia Transfer of Care Note  Patient: Adrian Clark  Procedure(s) Performed: Procedure(s): CARDIOVERSION (N/A)  Patient Location: Endoscopy unit  Anesthesia Type:MAC  Level of Consciousness: awake, alert , oriented and patient cooperative  Airway & Oxygen Therapy: patient spontaneously breathing  Post-op Assessment: Report given to RN, Post -op Vital signs reviewed and stable and Patient moving all extremities X 4  Post vital signs: Reviewed and stable  Last Vitals:  Vitals:   06/20/16 1418 06/20/16 1419  BP:    Pulse: (!) 55 (!) 54  Resp: (!) 23 (!) 24  Temp:      Last Pain:  Vitals:   06/20/16 1238  TempSrc: Oral         Complications: No apparent anesthesia complications

## 2016-06-20 NOTE — H&P (View-Only) (Signed)
Adrian Clark is a 60 y.o. male with a history of atrial fibrillation.  He had a previous ablation in 2013 with PVI. He presents today for AF ablation.  On exam, iRRR, no murmurs, lungs clear.  Risks and benefits discussed.  Risks include but not limited to bleeding, tamponade, heart block, stroke, and damage to surrounding organs. He understands these risks and has agreed to the procedure.  Valina Maes Curt Bears, MD 06/06/2016 7:15 AM

## 2016-06-20 NOTE — Anesthesia Preprocedure Evaluation (Signed)
Anesthesia Evaluation  Patient identified by MRN, date of birth, ID band Patient awake    Reviewed: Allergy & Precautions, NPO status , Patient's Chart, lab work & pertinent test results  Airway Mallampati: II  TM Distance: >3 FB Neck ROM: Full    Dental no notable dental hx.    Pulmonary sleep apnea ,    Pulmonary exam normal breath sounds clear to auscultation       Cardiovascular hypertension, Pt. on medications Normal cardiovascular exam Rhythm:Regular Rate:Normal  Echo 2015 Left ventricle: Systolic function was normal.The estimated ejection fraction was in the range of 55% to60%. Wall motion was normal; there were no regional wallmotion abnormalities. - Aortic valve: Trivial regurgitation. - Mitral valve: Mild regurgitation. - Left atrium: The atrium was moderately dilated. No evidence of thrombus in the atrial cavity. There was a thrombus. Cannot exclude thrombus in the appendage. Therewas spontaneous echo contrast ("smoke"). - Right atrium: No evidence of thrombus in the atrial cavity or appendage.   Neuro/Psych negative neurological ROS  negative psych ROS   GI/Hepatic negative GI ROS, Neg liver ROS,   Endo/Other  negative endocrine ROS  Renal/GU negative Renal ROS     Musculoskeletal negative musculoskeletal ROS (+)   Abdominal   Peds  Hematology negative hematology ROS (+)   Anesthesia Other Findings   Reproductive/Obstetrics negative OB ROS                            Anesthesia Physical Anesthesia Plan  ASA: II  Anesthesia Plan: General   Post-op Pain Management:    Induction: Intravenous  Airway Management Planned: Mask  Additional Equipment:   Intra-op Plan:   Post-operative Plan: Extubation in OR  Informed Consent: I have reviewed the patients History and Physical, chart, labs and discussed the procedure including the risks, benefits and  alternatives for the proposed anesthesia with the patient or authorized representative who has indicated his/her understanding and acceptance.   Dental advisory given  Plan Discussed with: CRNA  Anesthesia Plan Comments:         Anesthesia Quick Evaluation

## 2016-06-20 NOTE — Interval H&P Note (Signed)
History and Physical Interval Note:  06/20/2016 1:02 PM  Adrian Clark  has presented today for surgery, with the diagnosis of afib  The various methods of treatment have been discussed with the patient and family. After consideration of risks, benefits and other options for treatment, the patient has consented to  Procedure(s): CARDIOVERSION (N/A) as a surgical intervention .  The patient's history has been reviewed, patient examined, no change in status, stable for surgery.  I have reviewed the patient's chart and labs.  Questions were answered to the patient's satisfaction.     Ena Dawley

## 2016-06-20 NOTE — CV Procedure (Signed)
    Cardioversion Note  GOKU KELEMAN [id number] Apr 26, 1956  Procedure: DC Cardioversion Indications: atrial flutter  Procedure Details Consent: Obtained Time Out: Verified patient identification, verified procedure, site/side was marked, verified correct patient position, special equipment/implants available, Radiology Safety Procedures followed,  medications/allergies/relevent history reviewed, required imaging and test results available.  Performed  The patient has been on adequate anticoagulation.  The patient received IV propofol and lidocaine administered by anesthesia staff for sedation.  Synchronous cardioversion was performed at 120 joules.  The cardioversion was successful.  Complications: No apparent complications Patient did tolerate procedure well.  Ena Dawley, MD, [facility] 06/20/2016, 2:14 PM

## 2016-06-20 NOTE — Interval H&P Note (Signed)
History and Physical Interval Note:  06/20/2016 2:12 PM  Adrian Clark  has presented today for surgery, with the diagnosis of afib  The various methods of treatment have been discussed with the patient and family. After consideration of risks, benefits and other options for treatment, the patient has consented to  Procedure(s): CARDIOVERSION (N/A) as a surgical intervention .  The patient's history has been reviewed, patient examined, no change in status, stable for surgery.  I have reviewed the patient's chart and labs.  Questions were answered to the patient's satisfaction.     Ena Dawley

## 2016-06-20 NOTE — Anesthesia Postprocedure Evaluation (Signed)
Anesthesia Post Note  Patient: Adrian Clark  Procedure(s) Performed: Procedure(s) (LRB): CARDIOVERSION (N/A)  Patient location during evaluation: Endoscopy Anesthesia Type: General Level of consciousness: sedated and patient cooperative Pain management: pain level controlled Vital Signs Assessment: post-procedure vital signs reviewed and stable Respiratory status: spontaneous breathing Cardiovascular status: stable Anesthetic complications: no       Last Vitals:  Vitals:   06/20/16 1435 06/20/16 1445  BP: (!) 123/91 117/90  Pulse: (!) 53 (!) 55  Resp: 14 (!) 21  Temp:      Last Pain:  Vitals:   06/20/16 1238  TempSrc: Oral                 Nolon Nations

## 2016-06-20 NOTE — Discharge Instructions (Signed)
Electrical Cardioversion, Care After °This sheet gives you information about how to care for yourself after your procedure. Your health care provider may also give you more specific instructions. If you have problems or questions, contact your health care provider. °What can I expect after the procedure? °After the procedure, it is common to have: °· Some redness on the skin where the shocks were given. °Follow these instructions at home: °· Do not drive for 24 hours if you were given a medicine to help you relax (sedative). °· Take over-the-counter and prescription medicines only as told by your health care provider. °· Ask your health care provider how to check your pulse. Check it often. °· Rest for 48 hours after the procedure or as told by your health care provider. °· Avoid or limit your caffeine use as told by your health care provider. °Contact a health care provider if: °· You feel like your heart is beating too quickly or your pulse is not regular. °· You have a serious muscle cramp that does not go away. °Get help right away if: °· You have discomfort in your chest. °· You are dizzy or you feel faint. °· You have trouble breathing or you are short of breath. °· Your speech is slurred. °· You have trouble moving an arm or leg on one side of your body. °· Your fingers or toes turn cold or blue. °This information is not intended to replace advice given to you by your health care provider. Make sure you discuss any questions you have with your health care provider. °Document Released: 04/06/2013 Document Revised: 01/18/2016 Document Reviewed: 12/21/2015 °Elsevier Interactive Patient Education © 2017 Elsevier Inc. ° °

## 2016-06-24 ENCOUNTER — Ambulatory Visit (INDEPENDENT_AMBULATORY_CARE_PROVIDER_SITE_OTHER): Payer: BLUE CROSS/BLUE SHIELD

## 2016-06-24 DIAGNOSIS — I481 Persistent atrial fibrillation: Secondary | ICD-10-CM | POA: Diagnosis not present

## 2016-06-24 DIAGNOSIS — Z79899 Other long term (current) drug therapy: Secondary | ICD-10-CM

## 2016-06-24 DIAGNOSIS — I4819 Other persistent atrial fibrillation: Secondary | ICD-10-CM

## 2016-06-24 LAB — EXERCISE TOLERANCE TEST
CHL CUP MPHR: 159 {beats}/min
CHL RATE OF PERCEIVED EXERTION: 17
CSEPED: 8 min
Estimated workload: 10.1 METS
Exercise duration (sec): 33 s
Peak HR: 93 {beats}/min
Percent HR: 58 %
Rest HR: 55 {beats}/min

## 2016-07-04 ENCOUNTER — Encounter (HOSPITAL_COMMUNITY): Payer: Self-pay | Admitting: Nurse Practitioner

## 2016-07-04 ENCOUNTER — Other Ambulatory Visit: Payer: Self-pay

## 2016-07-04 ENCOUNTER — Ambulatory Visit (HOSPITAL_COMMUNITY)
Admission: RE | Admit: 2016-07-04 | Discharge: 2016-07-04 | Disposition: A | Discharge status: Home / Self Care | Payer: BLUE CROSS/BLUE SHIELD | Source: Ambulatory Visit | Attending: Nurse Practitioner | Admitting: Nurse Practitioner

## 2016-07-04 ENCOUNTER — Telehealth: Payer: Self-pay

## 2016-07-04 VITALS — BP 152/92 | HR 57 | Ht 75.0 in | Wt 247.2 lb

## 2016-07-04 DIAGNOSIS — I4819 Other persistent atrial fibrillation: Secondary | ICD-10-CM

## 2016-07-04 DIAGNOSIS — I481 Persistent atrial fibrillation: Secondary | ICD-10-CM | POA: Insufficient documentation

## 2016-07-04 DIAGNOSIS — I10 Essential (primary) hypertension: Secondary | ICD-10-CM | POA: Insufficient documentation

## 2016-07-04 DIAGNOSIS — Z7901 Long term (current) use of anticoagulants: Secondary | ICD-10-CM | POA: Diagnosis not present

## 2016-07-04 DIAGNOSIS — Z79899 Other long term (current) drug therapy: Secondary | ICD-10-CM | POA: Insufficient documentation

## 2016-07-04 MED ORDER — RIVAROXABAN 20 MG PO TABS: 20.0000 mg | ORAL_TABLET | Freq: Every day | ORAL | 1 refills | Status: DC

## 2016-07-04 NOTE — Progress Notes (Addendum)
Primary Care Physician: [name], MD Referring Physician: Dr. Reggy Eye Adrian Clark is a 61 y.o. male with a h/o afib, afib ablation x 1 in the [place] area and is s/p afib ablation with Dr. Shonna Chock x one month ago. He did have return of persistent afib after procedure and he was loaded on flecainide and had successful cardioversion 12/22. He has been in SR since then. He denies swallowing issues, or groin issues from ablation.  Today, he denies symptoms of palpitations, chest pain, shortness of breath, orthopnea, PND, lower extremity edema, dizziness, presyncope, syncope, or neurologic sequela. The patient is tolerating medications without difficulties and is otherwise without complaint today.   Past Medical History:  Diagnosis Date  . HTN (hypertension)   . OSA (obstructive sleep apnea)   . PAF (paroxysmal atrial fibrillation) ([place]) 2013   s/p ablation    Past Surgical History:  Procedure Laterality Date  . ABLATION     AFIB  . CARDIOVERSION N/A 07/11/2013   Procedure: CARDIOVERSION;  Surgeon: [name], MD;  Location: [facility] ENDOSCOPY;  Service: Cardiovascular;  Laterality: N/A;  . CARDIOVERSION N/A 08/20/2015   Procedure: CARDIOVERSION;  Surgeon: [name], MD;  Location: [facility] ENDOSCOPY;  Service: Cardiovascular;  Laterality: N/A;  . CARDIOVERSION N/A 06/20/2016   Procedure: CARDIOVERSION;  Surgeon: [name], MD;  Location: [place];  Service: Cardiovascular;  Laterality: N/A;  . CHOLECYSTECTOMY    . ELECTROPHYSIOLOGIC STUDY N/A 06/06/2016   Procedure: Atrial Fibrillation Ablation;  Surgeon: Will Meredith Leeds, MD;  Location: [place] CV LAB;  Service: Cardiovascular;  Laterality: N/A;  . TEE WITHOUT CARDIOVERSION N/A 07/11/2013   Procedure: TRANSESOPHAGEAL ECHOCARDIOGRAM (TEE);  Surgeon: [name], MD;  Location: [facility] ENDOSCOPY;  Service: Cardiovascular;  Laterality: N/A;  . TONSILLECTOMY    . VASECTOMY      Current Outpatient Prescriptions    Medication Sig Dispense Refill  . amLODipine-benazepril (LOTREL) 10-40 MG per capsule TAKE 1 CAPSULE BY MOUTH EVERY DAY 90 capsule 3  . flecainide (TAMBOCOR) 150 MG tablet Take 0.5 tablets (75 mg total) by mouth 2 (two) times daily. 30 tablet 3  . metoprolol succinate (TOPROL-XL) 50 MG 24 hr tablet Take 25 mg by mouth 2 (two) times daily. Take with or immediately following a meal.     . rivaroxaban (XARELTO) 20 MG TABS tablet Take 1 tablet (20 mg total) by mouth daily with supper. 30 tablet 6   No current facility-administered medications for this encounter.     No Known Allergies  Social History   Social History  . Marital status: Married    Spouse name: N/A  . Number of children: N/A  . Years of education: N/A   Occupational History  . Not on file.   Social History Main Topics  . Smoking status: Never Smoker  . Smokeless tobacco: Never Used  . Alcohol use 1.2 oz/week    2 Glasses of wine per week  . Drug use: No  . Sexual activity: Not on file   Other Topics Concern  . Not on file   Social History Narrative  . No narrative on file    Family History  Problem Relation Age of Onset  . Uterine cancer Mother   . Prostate cancer Father     ROS- All systems are reviewed and negative except as per the HPI above  Physical Exam: Vitals:   07/04/16 0840  BP: (!) 152/92  Pulse: (!) 57  Weight: 247 lb 3.2  oz (112.1 kg)  Height: 6\' 3"  (1.905 m)   Wt Readings from Last 3 Encounters:  07/04/16 247 lb 3.2 oz (112.1 kg)  06/06/16 242 lb (109.8 kg)  05/27/16 246 lb 6.4 oz (111.8 kg)    Labs: Lab Results  Component Value Date   NA 138 06/20/2016   K 4.1 06/20/2016   CL 105 05/27/2016   CO2 25 05/27/2016   GLUCOSE 96 06/20/2016   BUN 22 05/27/2016   CREATININE 1.00 05/27/2016   CALCIUM 9.2 05/27/2016   MG 2.2 07/08/2013   No results found for: INR Lab Results  Component Value Date   CHOL 177 05/27/2016   HDL 53 05/27/2016   LDLCALC 103 (H) 05/27/2016    TRIG 106 05/27/2016     GEN- The patient is well appearing, alert and oriented x 3 today.   Head- normocephalic, atraumatic Eyes-  Sclera clear, conjunctiva pink Ears- hearing intact Oropharynx- clear Neck- supple, no JVP Lymph- no cervical lymphadenopathy Lungs- Clear to ausculation bilaterally, normal work of breathing Heart- Regular rate and rhythm, no murmurs, rubs or gallops, PMI not laterally displaced GI- soft, NT, ND, + BS Extremities- no clubbing, cyanosis, or edema MS- no significant deformity or atrophy Skin- no rash or lesion Psych- euthymic mood, full affect Neuro- strength and sensation are intact  EKG- Sinus brady with first degree AV block at 57 bpm, pr int 222 ms, qrs int 118 ms, qtc 447 ms Epic records reviewed    Assessment and Plan: 1. Persistent afib No apparent  post procedure complications Doing well with flecainide and s/p successful ablation Continue flecainide 75 mg bid  Continue xarelto, states no missed doses  2. HTN Mildly elevated today but pt states at home, it is in the 130 range.  F/u with Dr. Curt Bears at scheduled 3/6   Adrian Clark, [facility] [address] [place], [place] [address] [phone]

## 2016-07-04 NOTE — Telephone Encounter (Signed)
Prior auth obtained for Xarelto 20mg  through [facility]. Patient advised of this. refill sent to pharmacy.

## 2016-09-02 ENCOUNTER — Ambulatory Visit (INDEPENDENT_AMBULATORY_CARE_PROVIDER_SITE_OTHER): Payer: BLUE CROSS/BLUE SHIELD | Admitting: Cardiology

## 2016-09-02 ENCOUNTER — Encounter: Payer: Self-pay | Admitting: Cardiology

## 2016-09-02 VITALS — BP 140/90 | HR 59 | Ht 75.0 in | Wt 247.6 lb

## 2016-09-02 DIAGNOSIS — I481 Persistent atrial fibrillation: Secondary | ICD-10-CM

## 2016-09-02 DIAGNOSIS — I4819 Other persistent atrial fibrillation: Secondary | ICD-10-CM

## 2016-09-02 MED ORDER — METOPROLOL SUCCINATE ER 50 MG PO TB24
50.0000 mg | ORAL_TABLET | Freq: Every day | ORAL | 3 refills | Status: DC
Start: 2016-09-02 — End: 2017-08-31

## 2016-09-02 MED ORDER — AMLODIPINE BESY-BENAZEPRIL HCL 10-40 MG PO CAPS: ORAL_CAPSULE | ORAL | 3 refills | Status: DC

## 2016-09-02 NOTE — Progress Notes (Signed)
Electrophysiology Office Note   Date:  09/02/2016   ID:  Adrian Clark, DOB [birth year], MRN [id number]  PCP:  [name], MD  Cardiologist:  [name] Primary Electrophysiologist:  Will Meredith Leeds, MD    Chief Complaint  Patient presents with  . Follow-up    Persistent Afib     History of Present Illness: Adrian Clark is a 61 y.o. male who presents today for electrophysiology evaluation.   History of PAF s/pstatus post ablation in 2013 [facility] in [place], [place]), OSA, HTN. Admitted in 1/15 with AF with RVR. He had LA thrombus on TEE. He eventually converted to NSR on his own.  Patient was back in atrial fibrillation 07/2015 CHADS2-VASc=1 (HTN). He was placed on Xarelto 20 mg QD with plans to proceed with DCCV after 4 weeks of uninterrupted anticoagulation. He underwent DCCV 08/20/15. Repeat AF ablation 06/06/16.  Fortunately, his right superior pulmonary vein was not isolated during repeat ablation. He was put on metoprolol and flecainide. He has since stopped the flecainide.   Today, he denies symptoms of palpitations, chest pain, shortness of breath, orthopnea, PND, lower extremity edema, claudication, dizziness, presyncope, syncope, bleeding, or neurologic sequela. The patient is tolerating medications without difficulties and is otherwise without complaint today.    Past Medical History:  Diagnosis Date  . HTN (hypertension)   . OSA (obstructive sleep apnea)   . PAF (paroxysmal atrial fibrillation) ([place]) 2013   s/p ablation    Past Surgical History:  Procedure Laterality Date  . ABLATION     AFIB  . CARDIOVERSION N/A 07/11/2013   Procedure: CARDIOVERSION;  Surgeon: [name], MD;  Location: [facility] ENDOSCOPY;  Service: Cardiovascular;  Laterality: N/A;  . CARDIOVERSION N/A 08/20/2015   Procedure: CARDIOVERSION;  Surgeon: [name], MD;  Location: [facility] ENDOSCOPY;  Service: Cardiovascular;  Laterality: N/A;  . CARDIOVERSION N/A 06/20/2016   Procedure: CARDIOVERSION;  Surgeon: [name], MD;  Location: [place];  Service: Cardiovascular;  Laterality: N/A;  . CHOLECYSTECTOMY    . ELECTROPHYSIOLOGIC STUDY N/A 06/06/2016   Procedure: Atrial Fibrillation Ablation;  Surgeon: Will Meredith Leeds, MD;  Location: [place] CV LAB;  Service: Cardiovascular;  Laterality: N/A;  . TEE WITHOUT CARDIOVERSION N/A 07/11/2013   Procedure: TRANSESOPHAGEAL ECHOCARDIOGRAM (TEE);  Surgeon: [name], MD;  Location: [facility] ENDOSCOPY;  Service: Cardiovascular;  Laterality: N/A;  . TONSILLECTOMY    . VASECTOMY       Current Outpatient Prescriptions  Medication Sig Dispense Refill  . amLODipine-benazepril (LOTREL) 10-40 MG capsule TAKE 1 CAPSULE BY MOUTH EVERY DAY 90 capsule 3  . metoprolol succinate (TOPROL-XL) 50 MG 24 hr tablet Take 1 tablet (50 mg total) by mouth daily. Take with or immediately following a meal. 90 tablet 3  . rivaroxaban (XARELTO) 20 MG TABS tablet Take 1 tablet (20 mg total) by mouth daily with supper. 30 tablet 1   No current facility-administered medications for this visit.     Allergies:   Patient has no known allergies.   Social History:  The patient  reports that he has never smoked. He has never used smokeless tobacco. He reports that he drinks about 1.2 oz of alcohol per week . He reports that he does not use drugs.   Family History:  The patient's family history includes Prostate cancer in his father; Uterine cancer in his mother.    ROS:  Please see the history of present illness.   Otherwise, review of systems is positive for  none.   All other systems are reviewed and negative.    PHYSICAL EXAM: VS:  BP 140/90   Pulse (!) 59   Ht 6\' 3"  (1.905 m)   Wt 247 lb 9.6 oz (112.3 kg)   BMI 30.95 kg/m  , BMI Body mass index is 30.95 kg/m. GEN: Well nourished, well developed, in no acute distress  HEENT: normal  Neck: no JVD, carotid bruits, or masses Cardiac: RRR; no murmurs, rubs, or gallops,no edema    Respiratory:  clear to auscultation bilaterally, normal work of breathing GI: soft, nontender, nondistended, + BS MS: no deformity or atrophy  Skin: warm and dry Neuro:  Strength and sensation are intact Psych: euthymic mood, full affect  EKG:  EKG is ordered today. Personal review of the ekg ordered shows sinus rhythm, rate 59, LAD  Recent Labs: 05/27/2016: ALT 17; BUN 22; Creat 1.00; Platelets 333; TSH 2.90 06/20/2016: Hemoglobin 15.0; Potassium 4.1; Sodium 138    Lipid Panel     Component Value Date/Time   CHOL 177 05/27/2016 0844   TRIG 106 05/27/2016 0844   HDL 53 05/27/2016 0844   CHOLHDL 3.3 05/27/2016 0844   VLDL 21 05/27/2016 0844   LDLCALC 103 (H) 05/27/2016 0844     Wt Readings from Last 3 Encounters:  09/02/16 247 lb 9.6 oz (112.3 kg)  07/04/16 247 lb 3.2 oz (112.1 kg)  06/06/16 242 lb (109.8 kg)      Other studies Reviewed: Additional studies/ records that were reviewed today include: TEE 07/11/13  Review of the above records today demonstrates:  - Left ventricle: Systolic function was normal. The estimated ejection fraction was in the range of 55% to 60%. Wall motion was normal; there were no regional wall motion abnormalities. - Aortic valve: Trivial regurgitation. - Mitral valve: Mild regurgitation. - Left atrium: The atrium was moderately dilated. No evidence of thrombus in the atrial cavity. There was a thrombus. Cannot exclude thrombus in the appendage. There was spontaneous echo contrast ("smoke"). - Right atrium: No evidence of thrombus in the atrial cavity or appendage.   ASSESSMENT AND PLAN:  1.  Persistent atrial fibrillation: Status post ablation, with 2 episodes of recurrence. Repeat ablation 06/06/16. Unable to isolate the RSPV. Was put on flecainide and metoprolol. Taking Xarelto for anticoagulation. He has remained in sinus rhythm. His stroke risk is low. Will hold his anticoagulation at this time.  This patients  CHA2DS2-VASc Score and unadjusted Ischemic Stroke Rate (% per year) is equal to 0.6 % stroke rate/year from a score of 1  Above score calculated as 1 point each if present [CHF, HTN, DM, Vascular=MI/PAD/Aortic Plaque, Age if 65-74, or Male] Above score calculated as 2 points each if present [Age > 75, or Stroke/TIA/TE]  2. OSA: compliant with CPAP  3. Hypertension: Well-controlled    Current medicines are reviewed at length with the patient today.   The patient does not have concerns regarding his medicines.  The following changes were made today:  Stop flecainide, xarelto, change Toprol XL to 50 mg daily  Labs/ tests ordered today include:  Orders Placed This Encounter  Procedures  . EKG 12-Lead     Disposition:   FU with Will Camnitz 6 months  Signed, Will Meredith Leeds, MD  09/02/2016 4:33 PM     [address] [place] [address] [phone] (office) [phone] (fax)

## 2016-09-02 NOTE — Patient Instructions (Signed)
Medication Instructions:    Your physician recommends that you continue on your current medications as directed. Please refer to the Current Medication list given to you today.  --- If you need a refill on your cardiac medications before your next appointment, please call your pharmacy. ---  Labwork:  None ordered  Testing/Procedures:  None ordered  Follow-Up:  Your physician wants you to follow-up in: 6 months with Dr. Camnitz.  You will receive a reminder letter in the mail two months in advance. If you don't receive a letter, please call our office to schedule the follow-up appointment.  Any Other Special Instructions Will Be Listed Below (If Applicable)  Thank you for choosing [facility]!!   Madhav Mohon, RN [phone]         

## 2016-09-04 ENCOUNTER — Telehealth: Payer: Self-pay | Admitting: *Deleted

## 2016-09-04 ENCOUNTER — Encounter: Payer: Self-pay | Admitting: Cardiology

## 2016-09-04 NOTE — Telephone Encounter (Signed)
-----   Message from [name], MD sent at 09/04/2016 10:41 AM EST ----- Good AHI and compliance.  Continue current CPAP settings.

## 2016-09-04 NOTE — Telephone Encounter (Signed)
Called the patient and gave him his sleep study results, he verbalized understanding and agreed

## 2016-11-04 DIAGNOSIS — M1711 Unilateral primary osteoarthritis, right knee: Secondary | ICD-10-CM | POA: Diagnosis not present

## 2016-11-04 DIAGNOSIS — M25461 Effusion, right knee: Secondary | ICD-10-CM | POA: Diagnosis not present

## 2016-11-04 DIAGNOSIS — I10 Essential (primary) hypertension: Secondary | ICD-10-CM | POA: Diagnosis not present

## 2016-11-04 DIAGNOSIS — M25462 Effusion, left knee: Secondary | ICD-10-CM | POA: Diagnosis not present

## 2016-11-14 DIAGNOSIS — M1712 Unilateral primary osteoarthritis, left knee: Secondary | ICD-10-CM | POA: Diagnosis not present

## 2016-11-14 DIAGNOSIS — I10 Essential (primary) hypertension: Secondary | ICD-10-CM | POA: Diagnosis not present

## 2016-11-14 DIAGNOSIS — R52 Pain, unspecified: Secondary | ICD-10-CM | POA: Diagnosis not present

## 2016-12-10 DIAGNOSIS — M1712 Unilateral primary osteoarthritis, left knee: Secondary | ICD-10-CM | POA: Diagnosis not present

## 2016-12-10 DIAGNOSIS — I10 Essential (primary) hypertension: Secondary | ICD-10-CM | POA: Diagnosis not present

## 2017-01-14 DIAGNOSIS — Z1211 Encounter for screening for malignant neoplasm of colon: Secondary | ICD-10-CM | POA: Diagnosis not present

## 2017-01-27 ENCOUNTER — Telehealth: Payer: Self-pay | Admitting: *Deleted

## 2017-01-27 NOTE — Telephone Encounter (Signed)
Clearance for colonoscopy w/ Dr. Benson Norway (scheduled for 02/05/17).  Dr. Curt Bears states:  Intermediate risk for low risk procedure.  Ok to stop Xarelto 24 hr prior to procedure and restart post procedure per Dr. Benson Norway instruction.   Given to medical records to fax.

## 2017-01-28 ENCOUNTER — Other Ambulatory Visit: Payer: Self-pay | Admitting: Gastroenterology

## 2017-02-06 ENCOUNTER — Ambulatory Visit (HOSPITAL_COMMUNITY)
Admission: RE | Admit: 2017-02-06 | Discharge: 2017-02-06 | Disposition: A | Discharge status: Home / Self Care | Payer: BLUE CROSS/BLUE SHIELD | Source: Ambulatory Visit | Attending: Gastroenterology | Admitting: Gastroenterology

## 2017-02-06 ENCOUNTER — Encounter (HOSPITAL_COMMUNITY)
Admission: RE | Disposition: A | Discharge status: Home / Self Care | Payer: Self-pay | Source: Ambulatory Visit | Attending: Gastroenterology

## 2017-02-06 ENCOUNTER — Encounter (HOSPITAL_COMMUNITY): Payer: Self-pay | Admitting: *Deleted

## 2017-02-06 DIAGNOSIS — K5730 Diverticulosis of large intestine without perforation or abscess without bleeding: Secondary | ICD-10-CM | POA: Diagnosis not present

## 2017-02-06 DIAGNOSIS — I480 Paroxysmal atrial fibrillation: Secondary | ICD-10-CM | POA: Insufficient documentation

## 2017-02-06 DIAGNOSIS — I10 Essential (primary) hypertension: Secondary | ICD-10-CM | POA: Insufficient documentation

## 2017-02-06 DIAGNOSIS — Z1211 Encounter for screening for malignant neoplasm of colon: Secondary | ICD-10-CM | POA: Insufficient documentation

## 2017-02-06 DIAGNOSIS — D122 Benign neoplasm of ascending colon: Secondary | ICD-10-CM | POA: Insufficient documentation

## 2017-02-06 DIAGNOSIS — G4733 Obstructive sleep apnea (adult) (pediatric): Secondary | ICD-10-CM | POA: Insufficient documentation

## 2017-02-06 DIAGNOSIS — D124 Benign neoplasm of descending colon: Secondary | ICD-10-CM | POA: Diagnosis not present

## 2017-02-06 DIAGNOSIS — Z9889 Other specified postprocedural states: Secondary | ICD-10-CM | POA: Insufficient documentation

## 2017-02-06 HISTORY — PX: COLONOSCOPY: SHX5424

## 2017-02-06 SURGERY — COLONOSCOPY
Anesthesia: Moderate Sedation

## 2017-02-06 MED ORDER — MIDAZOLAM HCL 5 MG/5ML IJ SOLN
INTRAMUSCULAR | Status: DC | PRN
  Administered 2017-02-06: 2 mg via INTRAVENOUS
  Administered 2017-02-06: 1 mg via INTRAVENOUS
  Administered 2017-02-06: 2 mg via INTRAVENOUS
  Administered 2017-02-06: 1 mg via INTRAVENOUS

## 2017-02-06 MED ORDER — FENTANYL CITRATE (PF) 100 MCG/2ML IJ SOLN
INTRAMUSCULAR | Status: AC
  Filled 2017-02-06: qty 2

## 2017-02-06 MED ORDER — FENTANYL CITRATE (PF) 100 MCG/2ML IJ SOLN
INTRAMUSCULAR | Status: DC | PRN
  Administered 2017-02-06: 12.5 ug via INTRAVENOUS
  Administered 2017-02-06 (×2): 25 ug via INTRAVENOUS

## 2017-02-06 MED ORDER — MIDAZOLAM HCL 5 MG/ML IJ SOLN
INTRAMUSCULAR | Status: AC
  Filled 2017-02-06: qty 2

## 2017-02-06 MED ORDER — SODIUM CHLORIDE 0.9 % IV SOLN: INTRAVENOUS | Status: DC

## 2017-02-06 NOTE — H&P (Signed)
Adrian Clark HPI: At this time the patient denies any problems with nausea, vomiting, fevers, chills, abdominal pain, diarrhea, constipation, hematochezia, melena, GERD, or dysphagia. The patient denies any known family history of colon cancers. No complaints of chest pain, SOB, MI, or sleep apnea. In 2007, the patient's colonoscopy was revealing for some polyps and he was recommended to have a 5 year follow up colonoscopy. He moved to [place], which is the reason for the delay in follow up. In 05/2016 he underwent his second cardiac ablation for afib.  Past Medical History:  Diagnosis Date  . HTN (hypertension)   . OSA (obstructive sleep apnea)   . PAF (paroxysmal atrial fibrillation) ([place]) 2013   s/p ablation     Past Surgical History:  Procedure Laterality Date  . ABLATION     AFIB  . CARDIOVERSION N/A 07/11/2013   Procedure: CARDIOVERSION;  Surgeon: [name], MD;  Location: [facility] ENDOSCOPY;  Service: Cardiovascular;  Laterality: N/A;  . CARDIOVERSION N/A 08/20/2015   Procedure: CARDIOVERSION;  Surgeon: [name], MD;  Location: [facility] ENDOSCOPY;  Service: Cardiovascular;  Laterality: N/A;  . CARDIOVERSION N/A 06/20/2016   Procedure: CARDIOVERSION;  Surgeon: [name], MD;  Location: [place];  Service: Cardiovascular;  Laterality: N/A;  . CHOLECYSTECTOMY    . ELECTROPHYSIOLOGIC STUDY N/A 06/06/2016   Procedure: Atrial Fibrillation Ablation;  Surgeon: Will Meredith Leeds, MD;  Location: [place] CV LAB;  Service: Cardiovascular;  Laterality: N/A;  . TEE WITHOUT CARDIOVERSION N/A 07/11/2013   Procedure: TRANSESOPHAGEAL ECHOCARDIOGRAM (TEE);  Surgeon: [name], MD;  Location: [facility] ENDOSCOPY;  Service: Cardiovascular;  Laterality: N/A;  . TONSILLECTOMY    . VASECTOMY      Family History  Problem Relation Age of Onset  . Uterine cancer Mother   . Prostate cancer Father     Social History:  reports that he has never smoked. He has never used smokeless tobacco.  He reports that he drinks about 1.2 oz of alcohol per week . He reports that he does not use drugs.  Allergies: No Known Allergies  Medications:  Scheduled:  Continuous: . sodium chloride      No results found for this or any previous visit (from the past 24 hour(s)).   No results found.  ROS:  As stated above in the HPI otherwise negative.  There were no vitals taken for this visit.    PE: Gen: NAD, Alert and Oriented HEENT:  [place]/AT, EOMI Neck: Supple, no LAD Lungs: CTA Bilaterally CV: RRR without M/G/R ABM: Soft, NTND, +BS Ext: No C/C/E  Assessment/Plan: 1) Screening colonoscopy.  Gloristine Turrubiates D 02/06/2017, 8:46 AM

## 2017-02-06 NOTE — Op Note (Signed)
[facility] Patient Name: Adrian Clark Procedure Date: 02/06/2017 MRN: [id number] Attending MD: [name] , MD Date of Birth: Jan 26, 1956 CSN: [id number] Age: 61 Admit Type: Outpatient Procedure:                Colonoscopy Indications:              Screening for colorectal malignant neoplasm Providers:                [name], MD, Zenon Mayo, RN, Cherylynn Ridges,                            Technician Referring MD:              Medicines:                Midazolam 6 mg IV, Fentanyl 62.5 micrograms IV Complications:            No immediate complications. Estimated Blood Loss:     Estimated blood loss: none. Procedure:                Pre-Anesthesia Assessment:                           - Prior to the procedure, a History and Physical                            was performed, and patient medications and                            allergies were reviewed. The patient's tolerance of                            previous anesthesia was also reviewed. The risks                            and benefits of the procedure and the sedation                            options and risks were discussed with the patient.                            All questions were answered, and informed consent                            was obtained. Prior Anticoagulants: The patient has                            taken no previous anticoagulant or antiplatelet                            agents. ASA Grade Assessment: II - A patient with                            mild systemic disease. After reviewing the risks  and benefits, the patient was deemed in                            satisfactory condition to undergo the procedure.                           - Sedation was administered by an endoscopy nurse.                            The sedation level attained was moderate.                           After obtaining informed consent, the colonoscope   was passed under direct vision. Throughout the                            procedure, the patient's blood pressure, pulse, and                            oxygen saturations were monitored continuously. The                            EC-[id number] ([id number]) scope was introduced through                            the anus and advanced to the the terminal ileum.                            The colonoscopy was performed without difficulty.                            The patient tolerated the procedure well. The                            quality of the bowel preparation was good. The                            terminal ileum, ileocecal valve, appendiceal                            orifice, and rectum were photographed. Scope In: 10:18:02 AM Scope Out: 10:42:58 AM Scope Withdrawal Time: 0 hours 21 minutes 0 seconds  Total Procedure Duration: 0 hours 24 minutes 56 seconds  Findings:      Three sessile polyps were found in the descending colon and ascending       colon. The polyps were 3 to 4 mm in size. These polyps were removed with       a cold snare. Resection and retrieval were complete.      Scattered small and large-mouthed diverticula were found in the sigmoid       colon. Impression:               - Three 3 to 4 mm polyps in the descending colon  and in the ascending colon, removed with a cold                            snare. Resected and retrieved.                           - Diverticulosis in the sigmoid colon. Moderate Sedation:      Moderate (conscious) sedation was administered by the endoscopy nurse       and supervised by the endoscopist. The following parameters were       monitored: oxygen saturation, heart rate, blood pressure, and response       to care. Recommendation:           - Patient has a contact number available for                            emergencies. The signs and symptoms of potential                            delayed complications were  discussed with the                            patient. Return to normal activities tomorrow.                            Written discharge instructions were provided to the                            patient.                           - Resume previous diet.                           - Continue present medications.                           - Await pathology results.                           - Repeat colonoscopy in 3 - 5 years for                            surveillance. Procedure Code(s):        --- Professional ---                           [phone], Colonoscopy, flexible; with removal of                            tumor(s), polyp(s), or other lesion(s) by snare                            technique Diagnosis Code(s):        --- Professional ---  Z12.11, Encounter for screening for malignant                            neoplasm of colon                           D12.4, Benign neoplasm of descending colon                           D12.2, Benign neoplasm of ascending colon                           K57.30, Diverticulosis of large intestine without                            perforation or abscess without bleeding CPT copyright 2016 American [employer] Association. All rights reserved. The codes documented in this report are preliminary and upon coder review may  be revised to meet current compliance requirements. [name], MD [name], MD 02/06/2017 10:48:37 AM This report has been signed electronically. Number of Addenda: 0

## 2017-02-06 NOTE — Discharge Instructions (Signed)

## 2017-02-08 ENCOUNTER — Encounter (HOSPITAL_COMMUNITY): Payer: Self-pay | Admitting: Gastroenterology

## 2017-03-16 DIAGNOSIS — I10 Essential (primary) hypertension: Secondary | ICD-10-CM | POA: Diagnosis not present

## 2017-03-16 DIAGNOSIS — M722 Plantar fascial fibromatosis: Secondary | ICD-10-CM | POA: Diagnosis not present

## 2017-03-16 DIAGNOSIS — R52 Pain, unspecified: Secondary | ICD-10-CM | POA: Diagnosis not present

## 2017-03-23 ENCOUNTER — Encounter: Payer: Self-pay | Admitting: Cardiology

## 2017-03-23 ENCOUNTER — Ambulatory Visit (INDEPENDENT_AMBULATORY_CARE_PROVIDER_SITE_OTHER): Payer: BLUE CROSS/BLUE SHIELD | Admitting: Cardiology

## 2017-03-23 VITALS — BP 126/94 | HR 68 | Ht 76.0 in | Wt 243.8 lb

## 2017-03-23 DIAGNOSIS — I10 Essential (primary) hypertension: Secondary | ICD-10-CM

## 2017-03-23 DIAGNOSIS — I480 Paroxysmal atrial fibrillation: Secondary | ICD-10-CM

## 2017-03-23 NOTE — Patient Instructions (Signed)
Medication Instructions:  Your physician recommends that you continue on your current medications as directed. Please refer to the Current Medication list given to you today.  If you need a refill on your cardiac medications before your next appointment, please call your pharmacy.   Labwork: None ordered  Testing/Procedures: None ordered  Follow-Up: Your physician wants you to follow-up in: 6 months with Dr. Camnitz.  You will receive a reminder letter in the mail two months in advance. If you don't receive a letter, please call our office to schedule the follow-up appointment.  Thank you for choosing [facility]!!   Xitlalli Newhard, RN [phone]         

## 2017-03-23 NOTE — Progress Notes (Signed)
Electrophysiology Office Note   Date:  03/23/2017   ID:  Adrian Clark, DOB 1956-04-07, MRN [id number]  PCP:  [name], MD  Cardiologist:  Adrian Clark Primary Electrophysiologist:  Adrian Lana Meredith Leeds, MD    Chief Complaint  Patient presents with  . Follow-up    Persistent Afib     History of Present Illness: Adrian Clark is a 61 y.o. male who presents today for electrophysiology evaluation.   History of PAF s/pstatus post ablation in 2013 [facility] in [place], [place]), OSA, HTN. Admitted in 1/15 with AF with RVR. He had LA thrombus on TEE. He eventually converted to NSR on his own.  Patient was back in atrial fibrillation 07/2015 CHADS2-VASc=1 (HTN). He was placed on Xarelto 20 mg QD with plans to proceed with DCCV after 4 weeks of uninterrupted anticoagulation. He underwent DCCV 08/20/15. Had repeat ablation on 06/06/16. 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 Since that time, was started on and has stopped flecainide. Has had no further episodes of atrial fibrillation.   Today, denies symptoms of palpitations, chest pain, shortness of breath, orthopnea, PND, lower extremity edema, claudication, dizziness, presyncope, syncope, bleeding, or neurologic sequela. The patient is tolerating medications without difficulties. He feels well without major complaint. No further palpitations, fatigue, or shortness of breath.    Past Medical History:  Diagnosis Date  . HTN (hypertension)   . OSA (obstructive sleep apnea)   . PAF (paroxysmal atrial fibrillation) ([name]) 2013   s/p ablation    Past Surgical History:  Procedure Laterality Date  . ABLATION     AFIB  . CARDIOVERSION N/A 07/11/2013   Procedure: CARDIOVERSION;  Surgeon: Adrian Margarita, MD;  Location: [facility] ENDOSCOPY;  Service: Cardiovascular;  Laterality: N/A;  . CARDIOVERSION N/A 08/20/2015   Procedure: CARDIOVERSION;  Surgeon: Adrian Klein, MD;  Location: [facility] ENDOSCOPY;  Service:  Cardiovascular;  Laterality: N/A;  . CARDIOVERSION N/A 06/20/2016   Procedure: CARDIOVERSION;  Surgeon: Adrian Spark, MD;  Location: [place];  Service: Cardiovascular;  Laterality: N/A;  . CHOLECYSTECTOMY    . COLONOSCOPY N/A 02/06/2017   Procedure: COLONOSCOPY;  Surgeon: Adrian Ada, MD;  Location: [facility] ENDOSCOPY;  Service: Endoscopy;  Laterality: N/A;  . ELECTROPHYSIOLOGIC STUDY N/A 06/06/2016   Procedure: Atrial Fibrillation Ablation;  Surgeon: Adrian Grima Meredith Leeds, MD;  Location: [place] CV LAB;  Service: Cardiovascular;  Laterality: N/A;  . TEE WITHOUT CARDIOVERSION N/A 07/11/2013   Procedure: TRANSESOPHAGEAL ECHOCARDIOGRAM (TEE);  Surgeon: Adrian Margarita, MD;  Location: [facility] ENDOSCOPY;  Service: Cardiovascular;  Laterality: N/A;  . TONSILLECTOMY    . VASECTOMY       Current Outpatient Prescriptions  Medication Sig Dispense Refill  . amLODipine-benazepril (LOTREL) 10-40 MG capsule TAKE 1 CAPSULE BY MOUTH EVERY DAY 90 capsule 3  . aspirin EC 81 MG tablet Take 81 mg by mouth daily.    . metoprolol succinate (TOPROL-XL) 50 MG 24 hr tablet Take 1 tablet (50 mg total) by mouth daily. Take with or immediately following a meal. 90 tablet 3   No current facility-administered medications for this visit.     Allergies:   Patient has no known allergies.   Social History:  The patient  reports that he has never smoked. He has never used smokeless tobacco. He reports that he drinks about 1.2 oz of alcohol per week . He reports that he does not use drugs.   Family History:  The patient's family history includes Prostate cancer in  his father; Uterine cancer in his mother.    ROS:  Please see the history of present illness.   Otherwise, review of systems is positive for none.   All other systems are reviewed and negative.   PHYSICAL EXAM: VS:  BP (!) 126/94   Pulse 68   Ht 6\' 4"  (1.93 m)   Wt 243 lb 12.8 oz (110.6 kg)   SpO2 97%   BMI 29.68 kg/m  , BMI Body mass index is 29.68  kg/m. GEN: Well nourished, well developed, in no acute distress  HEENT: normal  Neck: no JVD, carotid bruits, or masses Cardiac: RRR; no murmurs, rubs, or gallops,no edema  Respiratory:  clear to auscultation bilaterally, normal work of breathing GI: soft, nontender, nondistended, + BS MS: no deformity or atrophy  Skin: warm and dry Neuro:  Strength and sensation are intact Psych: euthymic mood, full affect  EKG:  EKG is not ordered today. Personal review of the ekg ordered 09/02/16 shows sinus rhythm   Recent Labs: 05/27/2016: ALT 17; BUN 22; Creat 1.00; Platelets 333; TSH 2.90 06/20/2016: Hemoglobin 15.0; Potassium 4.1; Sodium 138    Lipid Panel     Component Value Date/Time   CHOL 177 05/27/2016 0844   TRIG 106 05/27/2016 0844   HDL 53 05/27/2016 0844   CHOLHDL 3.3 05/27/2016 0844   VLDL 21 05/27/2016 0844   LDLCALC 103 (H) 05/27/2016 0844     Wt Readings from Last 3 Encounters:  03/23/17 243 lb 12.8 oz (110.6 kg)  02/06/17 235 lb (106.6 kg)  09/02/16 247 lb 9.6 oz (112.3 kg)      Other studies Reviewed: Additional studies/ records that were reviewed today include: TEE 07/11/13  Review of the above records today demonstrates:  - Left ventricle: Systolic function was normal. The estimated ejection fraction was in the range of 55% to 60%. Wall motion was normal; there were no regional wall motion abnormalities. - Aortic valve: Trivial regurgitation. - Mitral valve: Mild regurgitation. - Left atrium: The atrium was moderately dilated. No evidence of thrombus in the atrial cavity. There was a thrombus. Cannot exclude thrombus in the appendage. There was spontaneous echo contrast ("smoke"). - Right atrium: No evidence of thrombus in the atrial cavity or appendage.   ASSESSMENT AND PLAN:  1.  Persistent atrial fibrillation: Status post repeat ablation on 06/06/16, though unable to isolate the right superior pulmonary vein. Currently feeling well  without repeat episodes of atrial fibrillation. Not anticoagulated due to a low stroke risk. No changes at this time.  This patients CHA2DS2-VASc Score and unadjusted Ischemic Stroke Rate (% per year) is equal to 0.6 % stroke rate/year from a score of 1  Above score calculated as 1 point each if present [CHF, HTN, DM, Vascular=MI/PAD/Aortic Plaque, Age if 65-74, or Male] Above score calculated as 2 points each if present [Age > 75, or Stroke/TIA/TE]  2. OSA: Compliant with CPAP  3. Hypertension: Well-controlled today.    Current medicines are reviewed at length with the patient today.   The patient does not have concerns regarding his medicines.  The following changes were made today:  none  Labs/ tests ordered today include:  No orders of the defined types were placed in this encounter.    Disposition:   FU with Noal Abshier 6 months  Signed, Tamyrah Burbage Meredith Leeds, MD  03/23/2017 4:03 PM     [place] [address] [place]  [address] [phone] (office) [phone] (fax)

## 2017-04-01 ENCOUNTER — Encounter: Payer: Self-pay | Admitting: Cardiology

## 2017-04-17 DIAGNOSIS — M1712 Unilateral primary osteoarthritis, left knee: Secondary | ICD-10-CM | POA: Diagnosis not present

## 2017-04-17 DIAGNOSIS — I10 Essential (primary) hypertension: Secondary | ICD-10-CM | POA: Diagnosis not present

## 2017-08-31 ENCOUNTER — Other Ambulatory Visit: Payer: Self-pay | Admitting: Cardiology

## 2017-11-17 DIAGNOSIS — M79641 Pain in right hand: Secondary | ICD-10-CM | POA: Diagnosis not present

## 2017-11-17 DIAGNOSIS — M25562 Pain in left knee: Secondary | ICD-10-CM | POA: Diagnosis not present

## 2017-11-17 DIAGNOSIS — M79671 Pain in right foot: Secondary | ICD-10-CM | POA: Diagnosis not present

## 2017-11-25 DIAGNOSIS — M25562 Pain in left knee: Secondary | ICD-10-CM | POA: Diagnosis not present

## 2017-11-25 DIAGNOSIS — M79641 Pain in right hand: Secondary | ICD-10-CM | POA: Diagnosis not present

## 2017-11-25 DIAGNOSIS — M79671 Pain in right foot: Secondary | ICD-10-CM | POA: Diagnosis not present

## 2017-11-29 DIAGNOSIS — J0140 Acute pansinusitis, unspecified: Secondary | ICD-10-CM | POA: Diagnosis not present

## 2017-12-01 DIAGNOSIS — M25562 Pain in left knee: Secondary | ICD-10-CM | POA: Diagnosis not present

## 2017-12-01 DIAGNOSIS — M79671 Pain in right foot: Secondary | ICD-10-CM | POA: Diagnosis not present

## 2017-12-01 DIAGNOSIS — M79641 Pain in right hand: Secondary | ICD-10-CM | POA: Diagnosis not present

## 2017-12-03 DIAGNOSIS — M79641 Pain in right hand: Secondary | ICD-10-CM | POA: Diagnosis not present

## 2017-12-03 DIAGNOSIS — M25562 Pain in left knee: Secondary | ICD-10-CM | POA: Diagnosis not present

## 2017-12-03 DIAGNOSIS — M79671 Pain in right foot: Secondary | ICD-10-CM | POA: Diagnosis not present

## 2017-12-08 ENCOUNTER — Encounter: Payer: Self-pay | Admitting: Cardiology

## 2017-12-09 ENCOUNTER — Encounter: Payer: Self-pay | Admitting: Internal Medicine

## 2017-12-09 ENCOUNTER — Encounter: Payer: Self-pay | Admitting: Cardiology

## 2017-12-16 DIAGNOSIS — M79641 Pain in right hand: Secondary | ICD-10-CM | POA: Diagnosis not present

## 2017-12-16 DIAGNOSIS — M25562 Pain in left knee: Secondary | ICD-10-CM | POA: Diagnosis not present

## 2017-12-16 DIAGNOSIS — M79671 Pain in right foot: Secondary | ICD-10-CM | POA: Diagnosis not present

## 2017-12-18 DIAGNOSIS — M79671 Pain in right foot: Secondary | ICD-10-CM | POA: Diagnosis not present

## 2017-12-18 DIAGNOSIS — M25562 Pain in left knee: Secondary | ICD-10-CM | POA: Diagnosis not present

## 2017-12-18 DIAGNOSIS — M79641 Pain in right hand: Secondary | ICD-10-CM | POA: Diagnosis not present

## 2017-12-22 DIAGNOSIS — M79671 Pain in right foot: Secondary | ICD-10-CM | POA: Diagnosis not present

## 2017-12-22 DIAGNOSIS — M25562 Pain in left knee: Secondary | ICD-10-CM | POA: Diagnosis not present

## 2017-12-22 DIAGNOSIS — M79641 Pain in right hand: Secondary | ICD-10-CM | POA: Diagnosis not present

## 2018-01-24 NOTE — Progress Notes (Signed)
Electrophysiology Office Note   Date:  01/25/2018   ID:  Adrian Clark, DOB 1955-09-28, MRN [id number]  PCP:  [name], MD  Cardiologist:  [name] Primary Electrophysiologist:  Asuka Dusseau Meredith Leeds, MD    Chief Complaint  Patient presents with  . Atrial Fibrillation     History of Present Illness: Adrian Clark is a 62 y.o. male who presents today for electrophysiology evaluation.   History of PAF s/pstatus post ablation in 2013 [facility] in [place], [place]), OSA, HTN. Admitted in 1/15 with AF with RVR. He had LA thrombus on TEE. He eventually converted to NSR on his own.  Patient was back in atrial fibrillation 07/2015 CHADS2-VASc=1 (HTN). He was placed on Xarelto 20 mg QD with plans to proceed with DCCV after 4 weeks of uninterrupted anticoagulation. He underwent DCCV 08/20/15. Had repeat ablation on 06/06/16. 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 Since that time, was started on and has stopped flecainide. Has had no further episodes of atrial fibrillation.  Today, denies symptoms of palpitations, chest pain, shortness of breath, orthopnea, PND, lower extremity edema, claudication, dizziness, presyncope, syncope, bleeding, or neurologic sequela. The patient is tolerating medications without difficulties.  Overall he is feeling well.  He has noted no further episodes of atrial fibrillation.  His main complaint is knee pain.  Past Medical History:  Diagnosis Date  . HTN (hypertension)   . OSA (obstructive sleep apnea)   . PAF (paroxysmal atrial fibrillation) ([place]) 2013   s/p ablation    Past Surgical History:  Procedure Laterality Date  . ABLATION     AFIB  . CARDIOVERSION N/A 07/11/2013   Procedure: CARDIOVERSION;  Surgeon: [name], MD;  Location: [facility] ENDOSCOPY;  Service: Cardiovascular;  Laterality: N/A;  . CARDIOVERSION N/A 08/20/2015   Procedure: CARDIOVERSION;  Surgeon: [name], MD;  Location: [facility] ENDOSCOPY;   Service: Cardiovascular;  Laterality: N/A;  . CARDIOVERSION N/A 06/20/2016   Procedure: CARDIOVERSION;  Surgeon: [name], MD;  Location: [place];  Service: Cardiovascular;  Laterality: N/A;  . CHOLECYSTECTOMY    . COLONOSCOPY N/A 02/06/2017   Procedure: COLONOSCOPY;  Surgeon: [name], MD;  Location: [facility] ENDOSCOPY;  Service: Endoscopy;  Laterality: N/A;  . ELECTROPHYSIOLOGIC STUDY N/A 06/06/2016   Procedure: Atrial Fibrillation Ablation;  Surgeon: Maham Quintin Meredith Leeds, MD;  Location: [place] CV LAB;  Service: Cardiovascular;  Laterality: N/A;  . TEE WITHOUT CARDIOVERSION N/A 07/11/2013   Procedure: TRANSESOPHAGEAL ECHOCARDIOGRAM (TEE);  Surgeon: [name], MD;  Location: [facility] ENDOSCOPY;  Service: Cardiovascular;  Laterality: N/A;  . TONSILLECTOMY    . VASECTOMY       Current Outpatient Medications  Medication Sig Dispense Refill  . amLODipine-benazepril (LOTREL) 10-40 MG capsule Take 1 capsule by mouth daily. 90 capsule 1  . aspirin EC 81 MG tablet Take 81 mg by mouth daily.    Marland Kitchen dextromethorphan-guaiFENesin (MUCINEX DM) 30-600 MG 12hr tablet Take 1 tablet by mouth 2 (two) times daily as needed for cough (as needed for seasonal allergies).    . metoprolol succinate (TOPROL-XL) 50 MG 24 hr tablet Take 1 tablet (50 mg total) by mouth daily. Take with or immediately following a meal. 90 tablet 1   No current facility-administered medications for this visit.     Allergies:   Patient has no known allergies.   Social History:  The patient  reports that he has never smoked. He has never used smokeless tobacco. He reports that he  drinks about 1.2 oz of alcohol per week. He reports that he does not use drugs.   Family History:  The patient's family history includes Prostate cancer in his father; Uterine cancer in his mother.   ROS:  Please see the history of present illness.   Otherwise, review of systems is positive for cough, blood in urine, joint swelling.   All other  systems are reviewed and negative.   PHYSICAL EXAM: VS:  BP 138/88   Pulse (!) 57   Ht 6\' 4"  (1.93 m)   Wt 250 lb 9.6 oz (113.7 kg)   BMI 30.50 kg/m  , BMI Body mass index is 30.5 kg/m. GEN: Well nourished, well developed, in no acute distress  HEENT: normal  Neck: no JVD, carotid bruits, or masses Cardiac: RRR; no murmurs, rubs, or gallops,no edema  Respiratory:  clear to auscultation bilaterally, normal work of breathing GI: soft, nontender, nondistended, + BS MS: no deformity or atrophy  Skin: warm and dry Neuro:  Strength and sensation are intact Psych: euthymic mood, full affect  EKG:  EKG is ordered today. Personal review of the ekg ordered shows SR, rate 57   Recent Labs: No results found for requested labs within last 8760 hours.    Lipid Panel     Component Value Date/Time   CHOL 177 05/27/2016 0844   TRIG 106 05/27/2016 0844   HDL 53 05/27/2016 0844   CHOLHDL 3.3 05/27/2016 0844   VLDL 21 05/27/2016 0844   LDLCALC 103 (H) 05/27/2016 0844     Wt Readings from Last 3 Encounters:  01/25/18 250 lb 9.6 oz (113.7 kg)  03/23/17 243 lb 12.8 oz (110.6 kg)  02/06/17 235 lb (106.6 kg)      Other studies Reviewed: Additional studies/ records that were reviewed today include: TEE 07/11/13  Review of the above records today demonstrates:  - Left ventricle: Systolic function was normal. The estimated ejection fraction was in the range of 55% to 60%. Wall motion was normal; there were no regional wall motion abnormalities. - Aortic valve: Trivial regurgitation. - Mitral valve: Mild regurgitation. - Left atrium: The atrium was moderately dilated. No evidence of thrombus in the atrial cavity. There was a thrombus. Cannot exclude thrombus in the appendage. There was spontaneous echo contrast ("smoke"). - Right atrium: No evidence of thrombus in the atrial cavity or appendage.   ASSESSMENT AND PLAN:  1.  Persistent atrial fibrillation: Status post  ablation 06/06/2016, though unable to isolate the right superior pulmonary vein.  Has had no recurrence of atrial fibrillation.  Not anticoagulated due to low stroke risk.  No changes at this time.    This patients CHA2DS2-VASc Score and unadjusted Ischemic Stroke Rate (% per year) is equal to 0.6 % stroke rate/year from a score of 1  Above score calculated as 1 point each if present [CHF, HTN, DM, Vascular=MI/PAD/Aortic Plaque, Age if 65-74, or Male] Above score calculated as 2 points each if present [Age > 75, or Stroke/TIA/TE]  2. OSA: Compliant with CPAP  3. Hypertension: Mildly elevated today in clinic but well controlled at home.  No changes.    Current medicines are reviewed at length with the patient today.   The patient does not have concerns regarding his medicines.  The following changes were made today: None  Labs/ tests ordered today include:  Orders Placed This Encounter  Procedures  . EKG 12-Lead     Disposition:   FU with Deliah Strehlow 6 months  Signed,  Kaelyn Nauta Meredith Leeds, MD  01/25/2018 8:38 AM     [place] [address] [phone] (office) [phone] (fax)

## 2018-01-25 ENCOUNTER — Other Ambulatory Visit: Payer: Self-pay

## 2018-01-25 ENCOUNTER — Ambulatory Visit (INDEPENDENT_AMBULATORY_CARE_PROVIDER_SITE_OTHER): Payer: BLUE CROSS/BLUE SHIELD | Admitting: Cardiology

## 2018-01-25 ENCOUNTER — Encounter: Payer: Self-pay | Admitting: Cardiology

## 2018-01-25 VITALS — BP 138/88 | HR 57 | Ht 76.0 in | Wt 250.6 lb

## 2018-01-25 DIAGNOSIS — I10 Essential (primary) hypertension: Secondary | ICD-10-CM | POA: Diagnosis not present

## 2018-01-25 DIAGNOSIS — I480 Paroxysmal atrial fibrillation: Secondary | ICD-10-CM

## 2018-01-25 DIAGNOSIS — G4733 Obstructive sleep apnea (adult) (pediatric): Secondary | ICD-10-CM | POA: Diagnosis not present

## 2018-01-25 NOTE — Patient Instructions (Signed)
Medication Instructions:  Your physician recommends that you continue on your current medications as directed. Please refer to the Current Medication list given to you today.  * If you need a refill on your cardiac medications before your next appointment, please call your pharmacy.   Labwork: None ordered  Testing/Procedures: None ordered  Follow-Up: Your physician wants you to follow-up in: 6 months with Dr. Camnitz.  You will receive a reminder letter in the mail two months in advance. If you don't receive a letter, please call our office to schedule the follow-up appointment.  *Please note that any paperwork needing to be filled out by the provider will need to be addressed at the front desk prior to seeing the provider. Please note that any FMLA, disability or other documents regarding health condition is subject to a $25.00 charge that must be received prior to completion of paperwork in the form of a money order or check.  Thank you for choosing [facility]!!   Jontae Sonier, RN [phone]        

## 2018-01-29 ENCOUNTER — Telehealth: Payer: Self-pay | Admitting: *Deleted

## 2018-01-29 NOTE — Telephone Encounter (Signed)
-----   Message from [name], MD sent at 01/27/2018  6:25 PM EDT ----- Good AHI and compliance.  Continue current PAP settings.

## 2018-01-29 NOTE — Telephone Encounter (Signed)
Informed patient of sleep study results and patient understanding was verbalized. Patient is aware and agreeable to AHI being within range at 0.4. Patient is aware and agreeable to being in compliance with machine usage Patient is aware and agreeable to no change in current pressures.

## 2018-02-15 IMAGING — CT CT HEART MORPH/PULM VEIN W/ CM & W/O CA SCORE
1 of 10 series · 1 of 20 positions shown, 2 images · non-contrast
Comparison: None.

EXAM:
OVER-READ INTERPRETATION  CT CHEST

The following report is an over-read performed by radiologist Dr.
over-read does not include interpretation of cardiac or coronary
anatomy or pathology. The coronary CTA interpretation by the
cardiologist is attached.
CLINICAL DATA: Pre Ablation
Cardiac CTA/Pulmonary Veins
TECHNIQUE: The patient was scanned on a Philips 256 scanner. A 120 kV
retrospective scan was triggered in the descending thoracic aorta at
111 HU's. Gantry rotation speed was 270 msecs and collimation was .9
mm. No beta blockade or nitro were given. The 3D data set was
reconstructed in 5% intervals of the R-R cycle. Systolic and
diastolic phases were analyzed on a dedicated work station using
MPR, MIP and VRT modes. The patient received 80 cc of contrast.

[Series 300: locator · axial · 0.35mm/px · z∈[-167,-167]mm · 1 of 1 slices shown, 2 images]
[im 1/1  vessel]
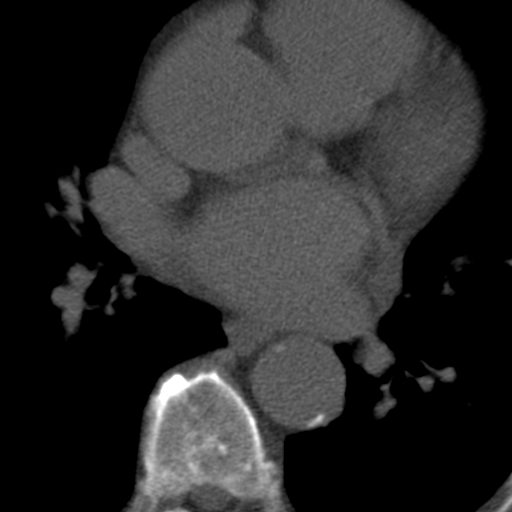
[im 1/1  lung]
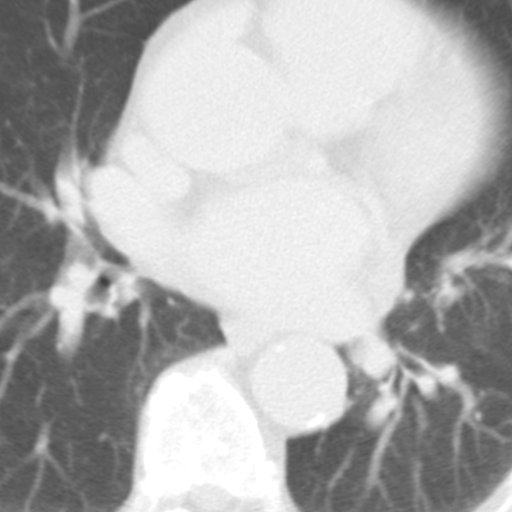

[1 of 20 positions shown; findings below may reference images not displayed]

FINDINGS: Limited view of the lung parenchyma demonstrates no suspicious
nodularity. Airways are normal.Limited view of the mediastinum
demonstrates no adenopathy. Esophagus normal. Limited view of the
upper abdomen unremarkable.Limited view of the skeleton and chest
wall is unremarkable. Hemangioma within the lower thoracic spine.
IMPRESSION: No significant extracardiac findings.
FINDINGS: There was moderate RA/RV enlargement No ASD/VSD or pericardial
effusion. The LA/LV were normal size. There was no CEOLA thrombus. The
esophagus coursed closest to the LLPV ostia

The pulmonary veins drained normally into the LA. There was a right
middle pulmonary vein. Sizes as below

LUPV:  Ostia 20 mm Area 2.8 cm2

LLPV:  Ostia 19 mm Area 3.1 cm2

RUPV:  Ostia 27 mm Area 4.5 cm2

RMPV:  Ostia 8 mm Area.6 cm2

RLPV:  Ostia 17.7 mm Area 2.2 cm2
IMPRESSION: 1) Moderate RA/RV enlargement

2) No CEOLA thrombus

3) Normal pulmonary vein anatomy including right middle vein
measurements as above

4) Esophagus courses closest to LLPV

5) Moderate ascending aortic root enlargement 4.5 cm

Egzoon Polo

## 2018-04-02 ENCOUNTER — Other Ambulatory Visit: Payer: Self-pay | Admitting: Cardiology

## 2018-04-05 DIAGNOSIS — M25562 Pain in left knee: Secondary | ICD-10-CM | POA: Diagnosis not present

## 2018-04-05 DIAGNOSIS — M79671 Pain in right foot: Secondary | ICD-10-CM | POA: Diagnosis not present

## 2018-04-05 DIAGNOSIS — M79641 Pain in right hand: Secondary | ICD-10-CM | POA: Diagnosis not present

## 2018-04-13 DIAGNOSIS — M79641 Pain in right hand: Secondary | ICD-10-CM | POA: Diagnosis not present

## 2018-04-13 DIAGNOSIS — M25562 Pain in left knee: Secondary | ICD-10-CM | POA: Diagnosis not present

## 2018-04-13 DIAGNOSIS — M79671 Pain in right foot: Secondary | ICD-10-CM | POA: Diagnosis not present

## 2018-05-03 ENCOUNTER — Ambulatory Visit (INDEPENDENT_AMBULATORY_CARE_PROVIDER_SITE_OTHER): Payer: BLUE CROSS/BLUE SHIELD

## 2018-05-03 ENCOUNTER — Encounter (INDEPENDENT_AMBULATORY_CARE_PROVIDER_SITE_OTHER): Payer: Self-pay | Admitting: Orthopedic Surgery

## 2018-05-03 ENCOUNTER — Ambulatory Visit (INDEPENDENT_AMBULATORY_CARE_PROVIDER_SITE_OTHER): Payer: BLUE CROSS/BLUE SHIELD | Admitting: Orthopedic Surgery

## 2018-05-03 DIAGNOSIS — M79671 Pain in right foot: Secondary | ICD-10-CM | POA: Diagnosis not present

## 2018-05-03 DIAGNOSIS — M1712 Unilateral primary osteoarthritis, left knee: Secondary | ICD-10-CM

## 2018-05-03 DIAGNOSIS — M25562 Pain in left knee: Secondary | ICD-10-CM

## 2018-05-06 ENCOUNTER — Encounter (INDEPENDENT_AMBULATORY_CARE_PROVIDER_SITE_OTHER): Payer: Self-pay | Admitting: Orthopedic Surgery

## 2018-05-06 NOTE — Progress Notes (Signed)
Office Visit Note   Patient: Adrian Clark           Date of Birth: Feb 24, 1956           MRN: [id number] Visit Date: 05/03/2018 Requested by: [name], MD [address] [place], [place] [address] PCP: [name], MD  Subjective: Chief Complaint  Patient presents with  . Left Knee - Pain  . Right Foot - Pain    HPI: Patient presents with 2 problems.  One is left knee pain.  The other is right foot pain.  In regards to the left knee he states that it is hard for him to get the left leg straight.  Does report a small amount of anterior medial pain.  In general he walks pretty well.  Has had no prior surgery on the left knee.  The pain does not wake him from sleep.  He had stem cell treatment about 1 year ago.  Does have a history of cortisone and gel injections in the left knee.  Takes Aleve occasionally.  Patient also describes right foot pain.  States that the right foot feels bruised.  Reports pain primarily on the dorsal medial aspect of the foot.  He had an anchor bolt fall of the foot about a year and a half ago and has had pain since that time.  Does affect his walking to some degree.              ROS: All systems reviewed are negative as they relate to the chief complaint within the history of present illness.  Patient denies  fevers or chills.   Assessment & Plan: Visit Diagnoses:  1. Pain in right foot   2. Left knee pain, unspecified chronicity   3. Unilateral primary osteoarthritis, left knee     Plan: Impression is progressive flexion contracture left leg with medial compartment arthritis but generalized global knee pain.  No prior history of injury or surgery.  I think for the knee he is pretty functional even though he has significant arthritis on plain radiographs.  He wants to live with what he has for now.  I think we could try some type of cellular-based therapy if he wants to pay out of pocket for it.  I think the jury is still out on the  effectiveness of that particular approach particularly in this knee which has bone-on-bone arthritis in the medial compartment.  Nonetheless if he wants to we can accommodate him as there are vendors around with centrifuge is that we can spin down blood in order to obtain injectable cellular therapy.  In regards to the right foot I think the patient does have some first tarsometatarsal arthritis.  We will try topical for that which is pen said.  Sample provided.  I will see him back as needed.  Follow-Up Instructions: Return if symptoms worsen or fail to improve.   Orders:  Orders Placed This Encounter  Procedures  . XR Foot Complete Right  . XR KNEE 3 VIEW LEFT   No orders of the defined types were placed in this encounter.     Procedures: No procedures performed   Clinical Data: No additional findings.  Objective: Vital Signs: There were no vitals taken for this visit.  Physical Exam:   Constitutional: Patient appears well-developed HEENT:  Head: Normocephalic Eyes:EOM are normal Neck: Normal range of motion Cardiovascular: Normal rate Pulmonary/chest: Effort normal Neurologic: Patient is alert Skin: Skin is warm  Psychiatric: Patient has normal mood and affect    Ortho Exam: Ortho exam demonstrates left knee with about a 5 to 6 degree flexion contracture.  Slight varus alignment is present.  Pedal pulses palpable bilaterally.  Range of motion is excellent past 90 degrees of flexion on the left.  No effusion in the left knee.  No groin pain with internal extra rotation of the leg.  Patient does not have much in the way of joint line tenderness.  Collateral and cruciate ligaments are stable.  Right foot is examined.  Does have tenderness at the first tarsometatarsal articulation with some small amount of spurring present.  Pedal pulses palpable.  Patient has palpable intact nontender anterior to posterior to peroneal and Achilles tendons.  No pain with pronation supination of  the foot.  No bruising is present.  He does have appropriate heel inversion when he stands on his toes.  Specialty Comments:  No specialty comments available.  Imaging: No results found.   PMFS History: Patient Active Problem List   Diagnosis Date Noted  . Persistent atrial fibrillation   . AF (atrial fibrillation) ([place]) 06/06/2016  . PAF (paroxysmal atrial fibrillation) ([place])   . OSA (obstructive sleep apnea)   . HTN (hypertension)    Past Medical History:  Diagnosis Date  . HTN (hypertension)   . OSA (obstructive sleep apnea)   . PAF (paroxysmal atrial fibrillation) ([place]) 2013   s/p ablation     Family History  Problem Relation Age of Onset  . Uterine cancer Mother   . Prostate cancer Father     Past Surgical History:  Procedure Laterality Date  . ABLATION     AFIB  . CARDIOVERSION N/A 07/11/2013   Procedure: CARDIOVERSION;  Surgeon: [name], MD;  Location: [facility] ENDOSCOPY;  Service: Cardiovascular;  Laterality: N/A;  . CARDIOVERSION N/A 08/20/2015   Procedure: CARDIOVERSION;  Surgeon: [name], MD;  Location: [facility] ENDOSCOPY;  Service: Cardiovascular;  Laterality: N/A;  . CARDIOVERSION N/A 06/20/2016   Procedure: CARDIOVERSION;  Surgeon: [name], MD;  Location: [place];  Service: Cardiovascular;  Laterality: N/A;  . CHOLECYSTECTOMY    . COLONOSCOPY N/A 02/06/2017   Procedure: COLONOSCOPY;  Surgeon: [name], MD;  Location: [facility] ENDOSCOPY;  Service: Endoscopy;  Laterality: N/A;  . ELECTROPHYSIOLOGIC STUDY N/A 06/06/2016   Procedure: Atrial Fibrillation Ablation;  Surgeon: Will Meredith Leeds, MD;  Location: [place] CV LAB;  Service: Cardiovascular;  Laterality: N/A;  . TEE WITHOUT CARDIOVERSION N/A 07/11/2013   Procedure: TRANSESOPHAGEAL ECHOCARDIOGRAM (TEE);  Surgeon: [name], MD;  Location: [facility] ENDOSCOPY;  Service: Cardiovascular;  Laterality: N/A;  . TONSILLECTOMY    . VASECTOMY     Social History   Occupational History  . Not on  file  Tobacco Use  . Smoking status: Never Smoker  . Smokeless tobacco: Never Used  Substance and Sexual Activity  . Alcohol use: Yes    Alcohol/week: 2.0 standard drinks    Types: 2 Glasses of wine per week  . Drug use: No  . Sexual activity: Not on file

## 2018-06-25 ENCOUNTER — Encounter: Payer: Self-pay | Admitting: Cardiology

## 2018-07-15 ENCOUNTER — Encounter: Payer: Self-pay | Admitting: *Deleted

## 2018-07-26 ENCOUNTER — Ambulatory Visit: Payer: BLUE CROSS/BLUE SHIELD | Admitting: Cardiology

## 2018-07-26 ENCOUNTER — Encounter: Payer: Self-pay | Admitting: Cardiology

## 2018-07-26 VITALS — BP 140/94 | HR 121 | Ht 75.0 in | Wt 251.4 lb

## 2018-07-26 DIAGNOSIS — I4819 Other persistent atrial fibrillation: Secondary | ICD-10-CM

## 2018-07-26 MED ORDER — METOPROLOL SUCCINATE ER 50 MG PO TB24: ORAL_TABLET | ORAL | 2 refills | Status: DC

## 2018-07-26 MED ORDER — AMLODIPINE BESY-BENAZEPRIL HCL 10-40 MG PO CAPS: 1.0000 | ORAL_CAPSULE | Freq: Every day | ORAL | 2 refills | Status: DC

## 2018-07-26 NOTE — Patient Instructions (Signed)
Medication Instructions:  Your physician recommends that you continue on your current medications as directed. Please refer to the Current Medication list given to you today.  * If you need a refill on your cardiac medications before your next appointment, please call your pharmacy.   Labwork: None ordered *We will only notify you of abnormal results, otherwise continue current treatment plan.  Testing/Procedures: Your physician has recommended that you have a Cardioversion (DCCV). Electrical Cardioversion uses a jolt of electricity to your heart either through paddles or wired patches attached to your chest. This is a controlled, usually prescheduled, procedure. Defibrillation is done under light anesthesia in the hospital, and you usually go home the day of the procedure. This is done to get your heart back into a normal rhythm. You are not awake for the procedure. Please see the instruction sheet given to you today.  Your provider has recommended a cardioversion.   You are scheduled for a cardioversion on _______ at _______ with Dr. _______ or associates. Please go to [place] through the [place] not have any food or drink after midnight on _______.  You may take your medicines with a sip of water on the day of your procedure.  You will need someone to drive you home following your procedure.   Call the [place] office at [phone] if you have any questions, problems or concerns.     Follow-Up: Your physician recommends that you schedule a follow-up appointment in: 4 weeks, after your cardioversion on _____, with Roderic Palau in the AFib clinic.  Your physician recommends that you schedule a follow-up appointment in: 3 months with Dr. Curt Bears.  Thank you for choosing [facility]!!   Trinidad Curet, RN [phone]  Any Other Special Instructions Will Be Listed Below (If Applicable).  Electrical  Cardioversion  Electrical cardioversion is the delivery of a jolt of electricity to restore a normal rhythm to the heart. A rhythm that is too fast or is not regular keeps the heart from pumping well. In this procedure, sticky patches or metal paddles are placed on the chest to deliver electricity to the heart from a device. This procedure may be done in an emergency if:  There is low or no blood pressure as a result of the heart rhythm.  Normal rhythm must be restored as fast as possible to protect the brain and heart from further damage.  It may save a life. This procedure may also be done for irregular or fast heart rhythms that are not immediately life-threatening. Tell a health care provider about:  Any allergies you have.  All medicines you are taking, including vitamins, herbs, eye drops, creams, and over-the-counter medicines.  Any problems you or family members have had with anesthetic medicines.  Any blood disorders you have.  Any surgeries you have had.  Any medical conditions you have.  Whether you are pregnant or may be pregnant. What are the risks? Generally, this is a safe procedure. However, problems may occur, including:  Allergic reactions to medicines.  A blood clot that breaks free and travels to other parts of your body.  The possible return of an abnormal heart rhythm within hours or days after the procedure.  Your heart stopping (cardiac arrest). This is rare. What happens before the procedure? Medicines  Your health care provider may have you start taking: ? Blood-thinning medicines (anticoagulants) so your blood does not clot as easily. ? Medicines may be  given to help stabilize your heart rate and rhythm.  Ask your health care provider about changing or stopping your regular medicines. This is especially important if you are taking diabetes medicines or blood thinners. General instructions  Plan to have someone take you home from the hospital  or clinic.  If you will be going home right after the procedure, plan to have someone with you for 24 hours.  Follow instructions from your health care provider about eating or drinking restrictions. What happens during the procedure?  To lower your risk of infection: ? Your health care team will wash or sanitize their hands. ? Your skin will be washed with soap.  An IV tube will be inserted into one of your veins.  You will be given a medicine to help you relax (sedative).  Sticky patches (electrodes) or metal paddles may be placed on your chest.  An electrical shock will be delivered. The procedure may vary among health care providers and hospitals. What happens after the procedure?   Your blood pressure, heart rate, breathing rate, and blood oxygen level will be monitored until the medicines you were given have worn off.  Do not drive for 24 hours if you were given a sedative.  Your heart rhythm will be watched to make sure it does not change. This information is not intended to replace advice given to you by your health care provider. Make sure you discuss any questions you have with your health care provider. Document Released: 06/06/2002 Document Revised: 02/13/2016 Document Reviewed: 12/21/2015 Elsevier Interactive Patient Education  2019 [employer].

## 2018-07-26 NOTE — Progress Notes (Signed)
Electrophysiology Office Note   Date:  07/26/2018   ID:  Adrian Clark, DOB 04-18-1956, MRN [id number]  PCP:  [name], MD  Cardiologist:  Adrian Clark Primary Electrophysiologist:  Adrian Pleitez Meredith Leeds, MD    Chief Complaint  Patient presents with  . Other    6 month follow up. patient states he has been in afib and has taken Xarelto for a week. Meds reviewed verbally with patient.      History of Present Illness: Adrian Clark is a 63 y.o. male who presents today for electrophysiology evaluation.   History of PAF s/pstatus post ablation in 2013 [facility] in [place], [place]), OSA, HTN. Admitted in 1/15 with AF with RVR. He had LA thrombus on TEE. He eventually converted to NSR on his own.  Patient was back in atrial fibrillation 07/2015 CHADS2-VASc=1 (HTN). He was placed on Xarelto 20 mg QD with plans to proceed with DCCV after 4 weeks of uninterrupted anticoagulation. He underwent DCCV 08/20/15. Had repeat ablation on 06/06/16. 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 Since that time, was started on and has stopped flecainide.   Today, denies symptoms of chest pain, sorthopnea, PND, lower extremity edema, claudication, dizziness, presyncope, syncope, bleeding, or neurologic sequela. The patient is tolerating medications without difficulties.  Fortunately he went back into atrial fibrillation 1 week ago.  He says that there is no inciting factor.  He had some leftover Xarelto at home and started taking it within 48 hours of his atrial fibrillation initiation.  He is minorly fatigued and short of breath.  He says that when he climbs the stairs to his fourth floor office, he is short of breath at the top which is new for him.  Past Medical History:  Diagnosis Date  . HTN (hypertension)   . OSA (obstructive sleep apnea)   . PAF (paroxysmal atrial fibrillation) ([place]) 2013   s/p ablation    Past Surgical History:  Procedure Laterality Date  .  ABLATION     AFIB  . CARDIOVERSION N/A 07/11/2013   Procedure: CARDIOVERSION;  Surgeon: Adrian Margarita, MD;  Location: [facility] ENDOSCOPY;  Service: Cardiovascular;  Laterality: N/A;  . CARDIOVERSION N/A 08/20/2015   Procedure: CARDIOVERSION;  Surgeon: [name], MD;  Location: [facility] ENDOSCOPY;  Service: Cardiovascular;  Laterality: N/A;  . CARDIOVERSION N/A 06/20/2016   Procedure: CARDIOVERSION;  Surgeon: [name], MD;  Location: [place];  Service: Cardiovascular;  Laterality: N/A;  . CHOLECYSTECTOMY    . COLONOSCOPY N/A 02/06/2017   Procedure: COLONOSCOPY;  Surgeon: [name], MD;  Location: [facility] ENDOSCOPY;  Service: Endoscopy;  Laterality: N/A;  . ELECTROPHYSIOLOGIC STUDY N/A 06/06/2016   Procedure: Atrial Fibrillation Ablation;  Surgeon: James Senn Meredith Leeds, MD;  Location: [place] CV LAB;  Service: Cardiovascular;  Laterality: N/A;  . TEE WITHOUT CARDIOVERSION N/A 07/11/2013   Procedure: TRANSESOPHAGEAL ECHOCARDIOGRAM (TEE);  Surgeon: Adrian Margarita, MD;  Location[facility] ENDOSCOPY;  Service: Cardiovascular;  Laterality: N/A;  . TONSILLECTOMY    . VASECTOMY       Current Outpatient Medications  Medication Sig Dispense Refill  . amLODipine-benazepril (LOTREL) 10-40 MG capsule Take 1 capsule by mouth daily. 90 capsule 2  . aspirin EC 81 MG tablet Take 81 mg by mouth daily.    Marland Kitchen dextromethorphan-guaiFENesin (MUCINEX DM) 30-600 MG 12hr tablet Take 1 tablet by mouth 2 (two) times daily as needed for cough (as needed for seasonal allergies).    . metoprolol succinate (TOPROL-XL) 50  MG 24 hr tablet TAKE 1 TABLET DAILY, TAKE WITH OR IMMEDIATELY FOLLOWING A MEAL 90 tablet 2  . rivaroxaban (XARELTO) 20 MG TABS tablet Take 20 mg by mouth daily with supper.     No current facility-administered medications for this visit.     Allergies:   Patient has no known allergies.   Social History:  The patient  reports that he has never smoked. He has never used smokeless tobacco. He reports  current alcohol use of about 2.0 standard drinks of alcohol per week. He reports that he does not use drugs.   Family History:  The patient's family history includes Prostate cancer in his father; Uterine cancer in his mother.   ROS:  Please see the history of present illness.   Otherwise, review of systems is positive for potation's, cough.   All other systems are reviewed and negative.   PHYSICAL EXAM: VS:  BP (!) 140/94 (BP Location: Left Arm, Patient Position: Sitting, Cuff Size: Normal)   Pulse (!) 121   Ht 6\' 3"  (1.905 m)   Wt 251 lb 6 oz (114 kg)   BMI 31.42 kg/m  , BMI Body mass index is 31.42 kg/m. GEN: Well nourished, well developed, in no acute distress  HEENT: normal  Neck: no JVD, carotid bruits, or masses Cardiac: Tachycardic, irregular; no murmurs, rubs, or gallops,no edema  Respiratory:  clear to auscultation bilaterally, normal work of breathing GI: soft, nontender, nondistended, + BS MS: no deformity or atrophy  Skin: warm and dry Neuro:  Strength and sensation are intact Psych: euthymic mood, full affect  EKG:  EKG is ordered today. Personal review of the ekg ordered shows atrial fibrillation, rate 121  Recent Labs: No results found for requested labs within last 8760 hours.    Lipid Panel     Component Value Date/Time   CHOL 177 05/27/2016 0844   TRIG 106 05/27/2016 0844   HDL 53 05/27/2016 0844   CHOLHDL 3.3 05/27/2016 0844   VLDL 21 05/27/2016 0844   LDLCALC 103 (H) 05/27/2016 0844     Wt Readings from Last 3 Encounters:  07/26/18 251 lb 6 oz (114 kg)  01/25/18 250 lb 9.6 oz (113.7 kg)  03/23/17 243 lb 12.8 oz (110.6 kg)      Other studies Reviewed: Additional studies/ records that were reviewed today include: TEE 07/11/13  Review of the above records today demonstrates:  - Left ventricle: Systolic function was normal. The estimated ejection fraction was in the range of 55% to 60%. Wall motion was normal; there were no regional  wall motion abnormalities. - Aortic valve: Trivial regurgitation. - Mitral valve: Mild regurgitation. - Left atrium: The atrium was moderately dilated. No evidence of thrombus in the atrial cavity. There was a thrombus. Cannot exclude thrombus in the appendage. There was spontaneous echo contrast ("smoke"). - Right atrium: No evidence of thrombus in the atrial cavity or appendage.   ASSESSMENT AND PLAN:  1.  Persistent atrial fibrillation: Status post ablation 06/06/2016.  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  He is unfortunately back in atrial fibrillation today for the first time.  He had previously not been anticoagulated but did have some Xarelto left over and he started it within 48 hours of his atrial fibrillation starting.  We Jovanni Rash plan for cardioversion.    This patients CHA2DS2-VASc Score and unadjusted Ischemic Stroke Rate (% per year) is equal to 0.6 % stroke rate/year from a score of 1  Above score calculated  as 1 point each if present [CHF, HTN, DM, Vascular=MI/PAD/Aortic Plaque, Age if 65-74, or Male] Above score calculated as 2 points each if present [Age > 75, or Stroke/TIA/TE]   2. OSA: Compliant with CPAP  3. Hypertension: Mildly elevated today.  Is well controlled at home.  No changes.    Current medicines are reviewed at length with the patient today.   The patient does not have concerns regarding his medicines.  The following changes were made today: None  Labs/ tests ordered today include:  Orders Placed This Encounter  Procedures  . EKG 12-Lead     Disposition:   FU with Adrian Clark 3 months  Signed, Adrian Skeens Meredith Leeds, MD  07/26/2018 4:33 PM     [place] [address] [place] [name] [address] [phone] (office) [phone] (fax)

## 2018-07-26 NOTE — H&P (View-Only) (Signed)
Electrophysiology Office Note   Date:  07/26/2018   ID:  Adrian Clark, DOB 1956-05-15, MRN [id number]  PCP:  [name], MD  Cardiologist:  [name] Primary Electrophysiologist:  Kamylle Axelson Meredith Leeds, MD    Chief Complaint  Patient presents with  . Other    6 month follow up. patient states he has been in afib and has taken Xarelto for a week. Meds reviewed verbally with patient.      History of Present Illness: Adrian Clark is a 63 y.o. male who presents today for electrophysiology evaluation.   History of PAF s/pstatus post ablation in 2013 [facility] in [place], [place]), OSA, HTN. Admitted in 1/15 with AF with RVR. He had LA thrombus on TEE. He eventually converted to NSR on his own.  Patient was back in atrial fibrillation 07/2015 CHADS2-VASc=1 (HTN). He was placed on Xarelto 20 mg QD with plans to proceed with DCCV after 4 weeks of uninterrupted anticoagulation. He underwent DCCV 08/20/15. Had repeat ablation on 06/06/16. 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 Since that time, was started on and has stopped flecainide.   Today, denies symptoms of chest pain, sorthopnea, PND, lower extremity edema, claudication, dizziness, presyncope, syncope, bleeding, or neurologic sequela. The patient is tolerating medications without difficulties.  Fortunately he went back into atrial fibrillation 1 week ago.  He says that there is no inciting factor.  He had some leftover Xarelto at home and started taking it within 48 hours of his atrial fibrillation initiation.  He is minorly fatigued and short of breath.  He says that when he climbs the stairs to his fourth floor office, he is short of breath at the top which is new for him.  Past Medical History:  Diagnosis Date  . HTN (hypertension)   . OSA (obstructive sleep apnea)   . PAF (paroxysmal atrial fibrillation) ([place]) 2013   s/p ablation    Past Surgical History:  Procedure Laterality Date  .  ABLATION     AFIB  . CARDIOVERSION N/A 07/11/2013   Procedure: CARDIOVERSION;  Surgeon: [name], MD;  Location: [facility] ENDOSCOPY;  Service: Cardiovascular;  Laterality: N/A;  . CARDIOVERSION N/A 08/20/2015   Procedure: CARDIOVERSION;  Surgeon: [name], MD;  Location: [facility] ENDOSCOPY;  Service: Cardiovascular;  Laterality: N/A;  . CARDIOVERSION N/A 06/20/2016   Procedure: CARDIOVERSION;  Surgeon: [name], MD;  Location: [facility];  Service: Cardiovascular;  Laterality: N/A;  . CHOLECYSTECTOMY    . COLONOSCOPY N/A 02/06/2017   Procedure: COLONOSCOPY;  Surgeon: [name], MD;  Location: [facility] ENDOSCOPY;  Service: Endoscopy;  Laterality: N/A;  . ELECTROPHYSIOLOGIC STUDY N/A 06/06/2016   Procedure: Atrial Fibrillation Ablation;  Surgeon: Marckus Hanover Meredith Leeds, MD;  Location: [place] CV LAB;  Service: Cardiovascular;  Laterality: N/A;  . TEE WITHOUT CARDIOVERSION N/A 07/11/2013   Procedure: TRANSESOPHAGEAL ECHOCARDIOGRAM (TEE);  Surgeon: [name], MD;  Location: [facility] ENDOSCOPY;  Service: Cardiovascular;  Laterality: N/A;  . TONSILLECTOMY    . VASECTOMY       Current Outpatient Medications  Medication Sig Dispense Refill  . amLODipine-benazepril (LOTREL) 10-40 MG capsule Take 1 capsule by mouth daily. 90 capsule 2  . aspirin EC 81 MG tablet Take 81 mg by mouth daily.    Marland Kitchen dextromethorphan-guaiFENesin (MUCINEX DM) 30-600 MG 12hr tablet Take 1 tablet by mouth 2 (two) times daily as needed for cough (as needed for seasonal allergies).    . metoprolol succinate (TOPROL-XL) 50  MG 24 hr tablet TAKE 1 TABLET DAILY, TAKE WITH OR IMMEDIATELY FOLLOWING A MEAL 90 tablet 2  . rivaroxaban (XARELTO) 20 MG TABS tablet Take 20 mg by mouth daily with supper.     No current facility-administered medications for this visit.     Allergies:   Patient has no known allergies.   Social History:  The patient  reports that he has never smoked. He has never used smokeless tobacco. He reports  current alcohol use of about 2.0 standard drinks of alcohol per week. He reports that he does not use drugs.   Family History:  The patient's family history includes Prostate cancer in his father; Uterine cancer in his mother.   ROS:  Please see the history of present illness.   Otherwise, review of systems is positive for potation's, cough.   All other systems are reviewed and negative.   PHYSICAL EXAM: VS:  BP (!) 140/94 (BP Location: Left Arm, Patient Position: Sitting, Cuff Size: Normal)   Pulse (!) 121   Ht 6\' 3"  (1.905 m)   Wt 251 lb 6 oz (114 kg)   BMI 31.42 kg/m  , BMI Body mass index is 31.42 kg/m. GEN: Well nourished, well developed, in no acute distress  HEENT: normal  Neck: no JVD, carotid bruits, or masses Cardiac: Tachycardic, irregular; no murmurs, rubs, or gallops,no edema  Respiratory:  clear to auscultation bilaterally, normal work of breathing GI: soft, nontender, nondistended, + BS MS: no deformity or atrophy  Skin: warm and dry Neuro:  Strength and sensation are intact Psych: euthymic mood, full affect  EKG:  EKG is ordered today. Personal review of the ekg ordered shows atrial fibrillation, rate 121  Recent Labs: No results found for requested labs within last 8760 hours.    Lipid Panel     Component Value Date/Time   CHOL 177 05/27/2016 0844   TRIG 106 05/27/2016 0844   HDL 53 05/27/2016 0844   CHOLHDL 3.3 05/27/2016 0844   VLDL 21 05/27/2016 0844   LDLCALC 103 (H) 05/27/2016 0844     Wt Readings from Last 3 Encounters:  07/26/18 251 lb 6 oz (114 kg)  01/25/18 250 lb 9.6 oz (113.7 kg)  03/23/17 243 lb 12.8 oz (110.6 kg)      Other studies Reviewed: Additional studies/ records that were reviewed today include: TEE 07/11/13  Review of the above records today demonstrates:  - Left ventricle: Systolic function was normal. The estimated ejection fraction was in the range of 55% to 60%. Wall motion was normal; there were no regional  wall motion abnormalities. - Aortic valve: Trivial regurgitation. - Mitral valve: Mild regurgitation. - Left atrium: The atrium was moderately dilated. No evidence of thrombus in the atrial cavity. There was a thrombus. Cannot exclude thrombus in the appendage. There was spontaneous echo contrast ("smoke"). - Right atrium: No evidence of thrombus in the atrial cavity or appendage.   ASSESSMENT AND PLAN:  1.  Persistent atrial fibrillation: Status post ablation 06/06/2016.  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  He is unfortunately back in atrial fibrillation today for the first time.  He had previously not been anticoagulated but did have some Xarelto left over and he started it within 48 hours of his atrial fibrillation starting.  We Darnice Comrie plan for cardioversion.    This patients CHA2DS2-VASc Score and unadjusted Ischemic Stroke Rate (% per year) is equal to 0.6 % stroke rate/year from a score of 1  Above score calculated  as 1 point each if present [CHF, HTN, DM, Vascular=MI/PAD/Aortic Plaque, Age if 65-74, or Male] Above score calculated as 2 points each if present [Age > 75, or Stroke/TIA/TE]   2. OSA: Compliant with CPAP  3. Hypertension: Mildly elevated today.  Is well controlled at home.  No changes.    Current medicines are reviewed at length with the patient today.   The patient does not have concerns regarding his medicines.  The following changes were made today: None  Labs/ tests ordered today include:  Orders Placed This Encounter  Procedures  . EKG 12-Lead     Disposition:   FU with Troy Hartzog 3 months  Signed, Lynwood Kubisiak Meredith Leeds, MD  07/26/2018 4:33 PM     [place] [address] [phone] (office) [phone] (fax)

## 2018-07-28 ENCOUNTER — Telehealth: Payer: Self-pay | Admitting: Cardiology

## 2018-07-28 ENCOUNTER — Encounter: Payer: Self-pay | Admitting: *Deleted

## 2018-07-28 ENCOUNTER — Other Ambulatory Visit: Payer: Self-pay | Admitting: Cardiology

## 2018-07-28 ENCOUNTER — Telehealth: Payer: Self-pay | Admitting: Pharmacist

## 2018-07-28 DIAGNOSIS — I4819 Other persistent atrial fibrillation: Secondary | ICD-10-CM | POA: Diagnosis not present

## 2018-07-28 DIAGNOSIS — N401 Enlarged prostate with lower urinary tract symptoms: Secondary | ICD-10-CM | POA: Diagnosis not present

## 2018-07-28 DIAGNOSIS — N400 Enlarged prostate without lower urinary tract symptoms: Secondary | ICD-10-CM | POA: Diagnosis not present

## 2018-07-28 DIAGNOSIS — Z125 Encounter for screening for malignant neoplasm of prostate: Secondary | ICD-10-CM | POA: Diagnosis not present

## 2018-07-28 MED ORDER — METOPROLOL SUCCINATE ER 50 MG PO TB24: ORAL_TABLET | ORAL | 2 refills | Status: DC

## 2018-07-28 MED ORDER — AMLODIPINE BESY-BENAZEPRIL HCL 10-40 MG PO CAPS: 1.0000 | ORAL_CAPSULE | Freq: Every day | ORAL | 2 refills | Status: DC

## 2018-07-28 MED ORDER — RIVAROXABAN 20 MG PO TABS: 20.0000 mg | ORAL_TABLET | Freq: Every day | ORAL | 6 refills | Status: DC

## 2018-07-28 NOTE — Telephone Encounter (Signed)
Patient requesting a refill on Xarelto 20mg . He recently restarted after going back into Afib. We do not have any lab work since 2017. Called patient and requested he come in for BMP and CBC as we need to check his kidney function and blood count to make sure he is on the correct dose. Pt requested to use a lab in [facility]. Will [facility] over orders to [facility] on [address]. Patient has a few tablets left. He will go to lab today.

## 2018-07-28 NOTE — Telephone Encounter (Addendum)
DCCV scheduled for 2/3 Instructions reviewed w/ patient and sent via [place]. Pt had lab work today and will be scanned into epic. Scheduled for post DCCV appt on 3/6 w/ [place]. Patient verbalized understanding and agreeable to plan.

## 2018-07-28 NOTE — Telephone Encounter (Signed)
Patient called stating he was returned a call to [employer] and would not give any further information. He stated she would know what the call was about.

## 2018-07-29 ENCOUNTER — Encounter: Payer: Self-pay | Admitting: *Deleted

## 2018-07-29 ENCOUNTER — Telehealth: Payer: Self-pay | Admitting: *Deleted

## 2018-07-29 LAB — CBC/DIFF AMBIGUOUS DEFAULT
Basophils Absolute: 0 10*3/uL (ref 0.0–0.2)
Basos: 1 %
EOS (ABSOLUTE): 0.2 10*3/uL (ref 0.0–0.4)
Eos: 2 %
Hematocrit: 45.7 % (ref 37.5–51.0)
Hemoglobin: 15.3 g/dL (ref 13.0–17.7)
Immature Grans (Abs): 0 10*3/uL (ref 0.0–0.1)
Immature Granulocytes: 0 %
Lymphocytes Absolute: 1.4 10*3/uL (ref 0.7–3.1)
Lymphs: 23 %
MCH: 28.3 pg (ref 26.6–33.0)
MCHC: 33.5 g/dL (ref 31.5–35.7)
MCV: 85 fL (ref 79–97)
Monocytes Absolute: 0.7 10*3/uL (ref 0.1–0.9)
Monocytes: 12 %
Neutrophils Absolute: 3.9 10*3/uL (ref 1.4–7.0)
Neutrophils: 62 %
PLATELETS: 302 10*3/uL (ref 150–450)
RBC: 5.41 x10E6/uL (ref 4.14–5.80)
RDW: 12.8 % (ref 11.6–15.4)
WBC: 6.2 10*3/uL (ref 3.4–10.8)

## 2018-07-29 LAB — BASIC METABOLIC PANEL
BUN/Creatinine Ratio: 22 (ref 10–24)
BUN: 24 mg/dL (ref 8–27)
CHLORIDE: 102 mmol/L (ref 96–106)
CO2: 24 mmol/L (ref 20–29)
Calcium: 9 mg/dL (ref 8.6–10.2)
Creatinine, Ser: 1.07 mg/dL (ref 0.76–1.27)
GFR calc Af Amer: 85 mL/min/{1.73_m2} (ref >59)
GFR calc non Af Amer: 73 mL/min/{1.73_m2} (ref >59)
Glucose: 98 mg/dL (ref 65–99)
Potassium: 4.3 mmol/L (ref 3.5–5.2)
Sodium: 141 mmol/L (ref 134–144)

## 2018-07-29 LAB — SPECIMEN STATUS REPORT

## 2018-07-29 MED ORDER — RIVAROXABAN 20 MG PO TABS: 20.0000 mg | ORAL_TABLET | Freq: Every day | ORAL | 6 refills | Status: DC

## 2018-07-29 NOTE — Telephone Encounter (Signed)
Prior authorization done for XARELTO through covermymeds, it was approved, pharmacy notified.

## 2018-07-29 NOTE — Addendum Note (Signed)
Addended by: SUPPLE, MEGAN E on: 07/29/2018 08:54 AM   Modules accepted: Orders

## 2018-07-29 NOTE — Telephone Encounter (Signed)
Labs faxed to office - CBC stable, SCr 1.07. Will send in refill for Xarelto 20mg  daily.

## 2018-07-30 ENCOUNTER — Telehealth: Payer: Self-pay | Admitting: *Deleted

## 2018-07-30 NOTE — Telephone Encounter (Signed)
-----   Message from [name], MD sent at 07/28/2018  3:34 PM EST ----- Good AHI and compliance.  Continue current PAP settings.

## 2018-07-30 NOTE — Telephone Encounter (Signed)
Informed patient of compliancePatient is aware and agreeable to AHI being within range at 0.3. Patient is aware and agreeable to being in compliance with machine usage. Patient is aware and agreeable to no change in current pressures results and verbalized understanding was indicated.

## 2018-08-02 ENCOUNTER — Other Ambulatory Visit: Payer: Self-pay

## 2018-08-02 ENCOUNTER — Ambulatory Visit (HOSPITAL_COMMUNITY): Payer: BLUE CROSS/BLUE SHIELD | Admitting: Certified Registered Nurse Anesthetist

## 2018-08-02 ENCOUNTER — Encounter (HOSPITAL_COMMUNITY)
Admission: RE | Disposition: A | Discharge status: Home / Self Care | Payer: Self-pay | Source: Home / Self Care | Attending: Cardiology

## 2018-08-02 ENCOUNTER — Encounter (HOSPITAL_COMMUNITY): Payer: Self-pay | Admitting: Certified Registered Nurse Anesthetist

## 2018-08-02 ENCOUNTER — Ambulatory Visit (HOSPITAL_COMMUNITY)
Admission: RE | Admit: 2018-08-02 | Discharge: 2018-08-02 | Disposition: A | Discharge status: Home / Self Care | Payer: BLUE CROSS/BLUE SHIELD | Attending: Cardiology | Admitting: Cardiology

## 2018-08-02 DIAGNOSIS — Z79899 Other long term (current) drug therapy: Secondary | ICD-10-CM | POA: Diagnosis not present

## 2018-08-02 DIAGNOSIS — Z7901 Long term (current) use of anticoagulants: Secondary | ICD-10-CM | POA: Diagnosis not present

## 2018-08-02 DIAGNOSIS — I4819 Other persistent atrial fibrillation: Secondary | ICD-10-CM | POA: Diagnosis not present

## 2018-08-02 DIAGNOSIS — I10 Essential (primary) hypertension: Secondary | ICD-10-CM | POA: Diagnosis not present

## 2018-08-02 DIAGNOSIS — G4733 Obstructive sleep apnea (adult) (pediatric): Secondary | ICD-10-CM | POA: Diagnosis not present

## 2018-08-02 DIAGNOSIS — Z7982 Long term (current) use of aspirin: Secondary | ICD-10-CM | POA: Insufficient documentation

## 2018-08-02 DIAGNOSIS — E119 Type 2 diabetes mellitus without complications: Secondary | ICD-10-CM | POA: Insufficient documentation

## 2018-08-02 DIAGNOSIS — I4891 Unspecified atrial fibrillation: Secondary | ICD-10-CM | POA: Diagnosis not present

## 2018-08-02 HISTORY — PX: CARDIOVERSION: SHX1299

## 2018-08-02 SURGERY — CARDIOVERSION
Anesthesia: General

## 2018-08-02 MED ORDER — PROPOFOL 10 MG/ML IV BOLUS
INTRAVENOUS | Status: DC | PRN
  Administered 2018-08-02: 120 mg via INTRAVENOUS

## 2018-08-02 MED ORDER — RIVAROXABAN 20 MG PO TABS: 20.0000 mg | ORAL_TABLET | Freq: Every day | ORAL | 2 refills | Status: DC

## 2018-08-02 MED ORDER — LIDOCAINE 2% (20 MG/ML) 5 ML SYRINGE
INTRAMUSCULAR | Status: DC | PRN
  Administered 2018-08-02: 80 mg via INTRAVENOUS

## 2018-08-02 MED ORDER — SODIUM CHLORIDE 0.9 % IV SOLN
INTRAVENOUS | Status: DC
  Administered 2018-08-02: 09:00:00 via INTRAVENOUS

## 2018-08-02 NOTE — Anesthesia Procedure Notes (Signed)
Date/Time: 08/02/2018 9:00 AM Performed by: Alain Marion, CRNA Pre-anesthesia Checklist: Patient identified, Emergency Drugs available, Suction available, Patient being monitored and Timeout performed Patient Re-evaluated:Patient Re-evaluated prior to induction Oxygen Delivery Method: Nasal cannula and Ambu bag Induction Type: IV induction Placement Confirmation: positive ETCO2

## 2018-08-02 NOTE — Anesthesia Postprocedure Evaluation (Signed)
Anesthesia Post Note  Patient: Adrian Clark  Procedure(s) Performed: CARDIOVERSION (N/A )     Patient location during evaluation: PACU Anesthesia Type: General Level of consciousness: awake and alert Pain management: pain level controlled Vital Signs Assessment: post-procedure vital signs reviewed and stable Respiratory status: spontaneous breathing, nonlabored ventilation and respiratory function stable Cardiovascular status: blood pressure returned to baseline and stable Postop Assessment: no apparent nausea or vomiting Anesthetic complications: no    Last Vitals:  Vitals:   08/02/18 0923 08/02/18 0930  BP: 104/79 (!) 131/91  Pulse: (!) 58 60  Resp: 17 16  Temp:    SpO2: 95% 95%    Last Pain:  Vitals:   08/02/18 0930  TempSrc:   PainSc: 0-No pain                 Audry Pili

## 2018-08-02 NOTE — Interval H&P Note (Signed)
History and Physical Interval Note:  08/02/2018 8:57 AM  Adrian Clark  has presented today for surgery, with the diagnosis of atrial fibrillation  The various methods of treatment have been discussed with the patient and family. After consideration of risks, benefits and other options for treatment, the patient has consented to  Procedure(s): CARDIOVERSION (N/A) as a surgical intervention .  The patient's history has been reviewed, patient examined, no change in status, stable for surgery.  I have reviewed the patient's chart and labs.  Questions were answered to the patient's satisfaction.     [employer]

## 2018-08-02 NOTE — Transfer of Care (Signed)
Immediate Anesthesia Transfer of Care Note  Patient: Adrian Clark  Procedure(s) Performed: CARDIOVERSION (N/A )  Patient Location: Endo unit  Anesthesia Type:General  Level of Consciousness: awake, alert  and oriented  Airway & Oxygen Therapy: Patient Spontanous Breathing and Patient connected to nasal cannula oxygen  Post-op Assessment: Report given to RN and Post -op Vital signs reviewed and stable  Post vital signs: Reviewed and stable  Last Vitals:  Vitals Value Taken Time  BP 139/100   Temp    Pulse 67   Resp 14   SpO2 96     Last Pain:  Vitals:   08/02/18 0800  TempSrc: Oral  PainSc: 0-No pain         Complications: No apparent anesthesia complications

## 2018-08-02 NOTE — Discharge Instructions (Signed)
Electrical Cardioversion, Care After °This sheet gives you information about how to care for yourself after your procedure. Your health care provider may also give you more specific instructions. If you have problems or questions, contact your health care provider. °What can I expect after the procedure? °After the procedure, it is common to have: °· Some redness on the skin where the shocks were given. °Follow these instructions at home: ° °· Do not drive for 24 hours if you were given a medicine to help you relax (sedative). °· Take over-the-counter and prescription medicines only as told by your health care provider. °· Ask your health care provider how to check your pulse. Check it often. °· Rest for 48 hours after the procedure or as told by your health care provider. °· Avoid or limit your caffeine use as told by your health care provider. °Contact a health care provider if: °· You feel like your heart is beating too quickly or your pulse is not regular. °· You have a serious muscle cramp that does not go away. °Get help right away if: ° °· You have discomfort in your chest. °· You are dizzy or you feel faint. °· You have trouble breathing or you are short of breath. °· Your speech is slurred. °· You have trouble moving an arm or leg on one side of your body. °· Your fingers or toes turn cold or blue. °This information is not intended to replace advice given to you by your health care provider. Make sure you discuss any questions you have with your health care provider. °Document Released: 04/06/2013 Document Revised: 01/18/2016 Document Reviewed: 12/21/2015 °Elsevier Interactive Patient Education © 2019 [employer] ° °

## 2018-08-02 NOTE — Anesthesia Preprocedure Evaluation (Addendum)
Anesthesia Evaluation  Patient identified by MRN, date of birth, ID band Patient awake    Reviewed: Allergy & Precautions, NPO status , Patient's Chart, lab work & pertinent test results, reviewed documented beta blocker date and time   History of Anesthesia Complications Negative for: history of anesthetic complications  Airway Mallampati: IV  TM Distance: >3 FB Neck ROM: Full    Dental  (+) Dental Advisory Given, Teeth Intact   Pulmonary sleep apnea and Continuous Positive Airway Pressure Ventilation ,    breath sounds clear to auscultation       Cardiovascular hypertension, Pt. on medications and Pt. on home beta blockers (-) angina+ dysrhythmias Atrial Fibrillation  Rhythm:Irregular Rate:Tachycardia   '17 Exercise stress test - Blood pressure demonstrated a normal response to exercise. There was no ST segment deviation noted during stress. No T wave inversion was noted during stress. Heart rate response inadequate for diagnosis.    Neuro/Psych negative neurological ROS  negative psych ROS   GI/Hepatic negative GI ROS, Neg liver ROS,   Endo/Other   Obesity   Renal/GU negative Renal ROS     Musculoskeletal negative musculoskeletal ROS (+)   Abdominal   Peds  Hematology negative hematology ROS (+)   Anesthesia Other Findings   Reproductive/Obstetrics                            Anesthesia Physical Anesthesia Plan  ASA: III  Anesthesia Plan: General   Post-op Pain Management:    Induction: Intravenous  PONV Risk Score and Plan: 2 and Treatment may vary due to age or medical condition and Propofol infusion  Airway Management Planned: Mask and Natural Airway  Additional Equipment: None  Intra-op Plan:   Post-operative Plan:   Informed Consent: I have reviewed the patients History and Physical, chart, labs and discussed the procedure including the risks, benefits and  alternatives for the proposed anesthesia with the patient or authorized representative who has indicated his/her understanding and acceptance.       Plan Discussed with: CRNA and Anesthesiologist  Anesthesia Plan Comments:        Anesthesia Quick Evaluation

## 2018-08-02 NOTE — CV Procedure (Signed)
    Electrical Cardioversion Procedure Note Adrian Clark [id number] Oct 18, 1955  Procedure: Electrical Cardioversion Indications:  Atrial Fibrillation  Time Out: Verified patient identification, verified procedure,medications/allergies/relevent history reviewed, required imaging and test results available.  Performed  Procedure Details  The patient was NPO after midnight. Anesthesia was administered at the beside  by Dr. Fransisco Beau with 120mg  of propofol.  Cardioversion was performed with synchronized biphasic defibrillation via AP pads with 120 joules.  1 attempt(s) were performed.  The patient converted to normal sinus rhythm. The patient tolerated the procedure well   IMPRESSION:  Successful cardioversion of atrial fibrillation    Adrian Clark 08/02/2018, 9:08 AM

## 2018-08-03 ENCOUNTER — Encounter (HOSPITAL_COMMUNITY): Payer: Self-pay | Admitting: Cardiology

## 2018-08-05 LAB — PSA: Prostate Specific Ag, Serum: 3.2 ng/mL (ref 0.0–4.0)

## 2018-08-05 LAB — SPECIMEN STATUS REPORT

## 2018-09-03 ENCOUNTER — Encounter: Payer: Self-pay | Admitting: Cardiology

## 2018-09-03 ENCOUNTER — Ambulatory Visit: Payer: BLUE CROSS/BLUE SHIELD | Admitting: Cardiology

## 2018-09-03 VITALS — BP 146/88 | HR 59 | Ht 75.5 in | Wt 245.0 lb

## 2018-09-03 DIAGNOSIS — I480 Paroxysmal atrial fibrillation: Secondary | ICD-10-CM | POA: Diagnosis not present

## 2018-09-03 MED ORDER — CARVEDILOL 6.25 MG PO TABS: 6.2500 mg | ORAL_TABLET | Freq: Two times a day (BID) | ORAL | 0 refills | Status: DC

## 2018-09-03 MED ORDER — CARVEDILOL 6.25 MG PO TABS: 6.2500 mg | ORAL_TABLET | Freq: Two times a day (BID) | ORAL | 2 refills | Status: DC

## 2018-09-03 NOTE — Progress Notes (Signed)
Electrophysiology Office Note   Date:  09/03/2018   ID:  [name], DOB 1956-04-24, MRN [id number]  PCP:  [name], MD  Cardiologist:  [name] Primary Electrophysiologist:  Adrian Kocher Meredith Leeds, MD    No chief complaint on file.    History of Present Illness: Adrian Clark is a 63 y.o. male who presents today for electrophysiology evaluation.   History of PAF s/pstatus post ablation in 2013 [facility] in [place], [place]), OSA, HTN. Admitted in 1/15 with AF with RVR. He had LA thrombus on TEE. He eventually converted to NSR on his own.  Patient was back in atrial fibrillation 07/2015 CHADS2-VASc=1 (HTN). He was placed on Xarelto 20 mg QD with plans to proceed with DCCV after 4 weeks of uninterrupted anticoagulation. He underwent DCCV 08/20/15. Had repeat ablation on 06/06/16. 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 Since that time, was started on and has stopped flecainide.   Today, denies symptoms of palpitations, chest Clark, shortness of breath, orthopnea, PND, lower extremity edema, claudication, dizziness, presyncope, syncope, bleeding, or neurologic sequela. The patient is tolerating medications without difficulties.  Overall he is doing well.  He has no chest Clark or shortness of breath.  He is able to do all of his daily activities.  He did have a cardioversion 08/02/2018 and remains in sinus rhythm.  Past Medical History:  Diagnosis Date  . HTN (hypertension)   . OSA (obstructive sleep apnea)   . PAF (paroxysmal atrial fibrillation) ([name]) 2013   s/p ablation    Past Surgical History:  Procedure Laterality Date  . ABLATION     AFIB  . CARDIOVERSION N/A 07/11/2013   Procedure: CARDIOVERSION;  Surgeon: Adrian Margarita, MD;  Location: [facility] ENDOSCOPY;  Service: Cardiovascular;  Laterality: N/A;  . CARDIOVERSION N/A 08/20/2015   Procedure: CARDIOVERSION;  Surgeon: Adrian Klein, MD;  Location: [place] ENDOSCOPY;  Service: Cardiovascular;   Laterality: N/A;  . CARDIOVERSION N/A 06/20/2016   Procedure: CARDIOVERSION;  Surgeon: Adrian Spark, MD;  Location: [place];  Service: Cardiovascular;  Laterality: N/A;  . CARDIOVERSION N/A 08/02/2018   Procedure: CARDIOVERSION;  Surgeon: Adrian Pain, MD;  Location: [facility] ENDOSCOPY;  Service: Cardiovascular;  Laterality: N/A;  . CHOLECYSTECTOMY    . COLONOSCOPY N/A 02/06/2017   Procedure: COLONOSCOPY;  Surgeon: [name], MD;  Location: [facility] ENDOSCOPY;  Service: Endoscopy;  Laterality: N/A;  . ELECTROPHYSIOLOGIC STUDY N/A 06/06/2016   Procedure: Atrial Fibrillation Ablation;  Surgeon: Adrian Dowdle Meredith Leeds, MD;  Location: [place] CV LAB;  Service: Cardiovascular;  Laterality: N/A;  . TEE WITHOUT CARDIOVERSION N/A 07/11/2013   Procedure: TRANSESOPHAGEAL ECHOCARDIOGRAM (TEE);  Surgeon: Adrian Margarita, MD;  Location: [facility] ENDOSCOPY;  Service: Cardiovascular;  Laterality: N/A;  . TONSILLECTOMY    . VASECTOMY       Current Outpatient Medications  Medication Sig Dispense Refill  . amLODipine-benazepril (LOTREL) 10-40 MG capsule Take 1 capsule by mouth daily. 90 capsule 2  . rivaroxaban (XARELTO) 20 MG TABS tablet Take 1 tablet (20 mg total) by mouth daily with supper. 90 tablet 2  . carvedilol (COREG) 6.25 MG tablet Take 1 tablet (6.25 mg total) by mouth 2 (two) times daily. 180 tablet 2   No current facility-administered medications for this visit.     Allergies:   Patient has no known allergies.   Social History:  The patient  reports that he has never smoked. He has never used smokeless tobacco. He reports current alcohol use  of about 2.0 standard drinks of alcohol per week. He reports that he does not use drugs.   Family History:  The patient's family history includes Prostate cancer in his father; Uterine cancer in his mother.   ROS:  Please see the history of present illness.   Otherwise, review of systems is positive for none.   All other systems are reviewed and negative.    PHYSICAL EXAM: VS:  BP (!) 146/88   Pulse (!) 59   Ht 6' 3.5" (1.918 m)   Wt 245 lb (111.1 kg)   SpO2 98%   BMI 30.22 kg/m  , BMI Body mass index is 30.22 kg/m. GEN: Well nourished, well developed, in no acute distress  HEENT: normal  Neck: no JVD, carotid bruits, or masses Cardiac: RRR; no murmurs, rubs, or gallops,no edema  Respiratory:  clear to auscultation bilaterally, normal work of breathing GI: soft, nontender, nondistended, + BS MS: no deformity or atrophy  Skin: warm and dry Neuro:  Strength and sensation are intact Psych: euthymic mood, full affect  EKG:  EKG is ordered today. Personal review of the ekg ordered shows sinus rhythm, rate 59  Recent Labs: 07/28/2018: BUN 24; Creatinine, Ser 1.07; Hemoglobin 15.3; Platelets 302; Potassium 4.3; Sodium 141    Lipid Panel     Component Value Date/Time   CHOL 177 05/27/2016 0844   TRIG 106 05/27/2016 0844   HDL 53 05/27/2016 0844   CHOLHDL 3.3 05/27/2016 0844   VLDL 21 05/27/2016 0844   LDLCALC 103 (H) 05/27/2016 0844     Wt Readings from Last 3 Encounters:  09/03/18 245 lb (111.1 kg)  08/02/18 251 lb 6 oz (114 kg)  07/26/18 251 lb 6 oz (114 kg)      Other studies Reviewed: Additional studies/ records that were reviewed today include: TEE 07/11/13  Review of the above records today demonstrates:  - Left ventricle: Systolic function was normal. The estimated ejection fraction was in the range of 55% to 60%. Wall motion was normal; there were no regional wall motion abnormalities. - Aortic valve: Trivial regurgitation. - Mitral valve: Mild regurgitation. - Left atrium: The atrium was moderately dilated. No evidence of thrombus in the atrial cavity. There was a thrombus. Cannot exclude thrombus in the appendage. There was spontaneous echo contrast ("smoke"). - Right atrium: No evidence of thrombus in the atrial cavity or appendage.   ASSESSMENT AND PLAN:  1.  Persistent atrial  fibrillation: Post ablation 06/06/2016 though unable to isolate the right superior pulmonary vein.  Had a cardioversion 08/02/2018.  He remains in sinus rhythm.  Since he has been in sinus rhythm, Breeanne Oblinger plan for holding his Xarelto as he has had a low stroke risk.      This patients CHA2DS2-VASc Score and unadjusted Ischemic Stroke Rate (% per year) is equal to 0.6 % stroke rate/year from a score of 1  Above score calculated as 1 point each if present [CHF, HTN, DM, Vascular=MI/PAD/Aortic Plaque, Age if 65-74, or Male] Above score calculated as 2 points each if present [Age > 75, or Stroke/TIA/TE]   2. OSA: Compliant with CPAP  3. Hypertension: Blood pressure is elevated today and has been in the 130s at home.  Due to that, we Mickell Birdwell start him on carvedilol and stop his Toprol-XL.    Current medicines are reviewed at length with the patient today.   The patient does not have concerns regarding his medicines.  The following changes were made today: Stop Toprol-XL, start  carvedilol  Labs/ tests ordered today include:  Orders Placed This Encounter  Procedures  . EKG 12-Lead     Disposition:   FU with Shamina Etheridge 6 months  Signed, Sinthia Karabin Meredith Leeds, MD  09/03/2018 8:48 AM     [facility] [address] [place] [address] [phone] (office) [phone] (fax)

## 2018-09-03 NOTE — Patient Instructions (Addendum)
Medication Instructions:  Your physician has recommended you make the following change in your medication: 1. STOP Toprol 2. START Carvedilol 6.25 mg twice daily  * If you need a refill on your cardiac medications before your next appointment, please call your pharmacy.   Labwork: None ordered  Testing/Procedures: None ordered  Follow-Up: Your physician wants you to follow-up in 6 months with Dr. Curt Bears.  You will receive a reminder letter in the mail two months in advance. If you don't receive a letter, please call our office to schedule the follow-up appointment.  Thank you for choosing [facility]!!   Trinidad Curet, RN [phone]

## 2018-12-02 DIAGNOSIS — G8929 Other chronic pain: Secondary | ICD-10-CM | POA: Diagnosis not present

## 2018-12-02 DIAGNOSIS — M25562 Pain in left knee: Secondary | ICD-10-CM | POA: Diagnosis not present

## 2018-12-02 DIAGNOSIS — M1712 Unilateral primary osteoarthritis, left knee: Secondary | ICD-10-CM | POA: Diagnosis not present

## 2018-12-17 ENCOUNTER — Telehealth: Payer: Self-pay | Admitting: *Deleted

## 2018-12-17 NOTE — Telephone Encounter (Signed)
   [facility] Pre-operative Risk Assessment    Request for surgical clearance:  1. What type of surgery is being performed? LEFT TOTAL KNEE REPLACEMENT  2. When is this surgery scheduled? TBD  3. What type of clearance is required (medical clearance vs. Pharmacy clearance to hold med vs. Both)? BOTH  4. Are there any medications that need to be held prior to surgery and how long? [place]  5. Practice name and name of physician performing surgery? [facility]; DR. Harrell Gave BASHORE  6. What is your office phone number [phone]   7.   What is your office fax number [phone]  8.   Anesthesia type (None, local, MAC, general) ? GENERAL ? NOT LISTED   Julaine Hua 12/17/2018, 2:44 PM  _________________________________________________________________   (provider comments below)

## 2018-12-17 NOTE — Telephone Encounter (Signed)
Of note-patient was seen on 09/03/2018 by Dr. Ileana Ladd whose note states that he could hold Xarelto due to low stroke risk. Dr. Ileana Ladd is this patient supposed to still be on anticoagulation?  Patient with diagnosis of afib on Xarelto for anticoagulation.    Procedure: LEFT TOTAL KNEE REPLACEMENT Date of procedure: TBD  CHADS2-VASc score of  1 (CHF, HTN, AGE, DM2, stroke/tia x 2, CAD, AGE, male)  CrCl 67ml/min  Per office protocol, patient can hold Xarelto for 3 days prior to procedure.

## 2018-12-20 NOTE — Telephone Encounter (Signed)
   Primary Cardiologist: Dr. Curt Bears  Chart reviewed as part of pre-operative protocol coverage. Patient was contacted 12/20/2018 in reference to pre-operative risk assessment for pending surgery as outlined below.  Adrian Clark was last seen on 09/03/2018 by Dr. Curt Bears.  Since that day, Adrian Clark has done well. He is s/p afib ablation in 2017 with no recent afib. He has no known CAD, stroke or diabetes.  Gets about 7k steps per day and 10-15 flights of stairs. His office is on 4th floor and he takes the stairs several times per day. He also goes Orthoptist. He has no exertional chest pain/pressure or shortness of breath. According to the RCRI-Revised Cardiac Risk Index, Adrian Clark is a Class I Risk with 0.4% risk of major cardiac event perioperatively which is low risk.   Therefore, based on ACC/AHA guidelines, the patient would be at acceptable risk for the planned procedure without further cardiovascular testing.   Adrian Clark is not longer taking anticoagulation with Xarelto as of his office visit in 08/2018.  I will remove it from his med list.   I will route this recommendation to the requesting party via Epic fax function and remove from pre-op pool.  Please call with questions.  Daune Perch, NP 12/20/2018, 3:20 PM

## 2019-01-17 DIAGNOSIS — Z01818 Encounter for other preprocedural examination: Secondary | ICD-10-CM | POA: Diagnosis not present

## 2019-01-17 DIAGNOSIS — M6281 Muscle weakness (generalized): Secondary | ICD-10-CM | POA: Diagnosis not present

## 2019-01-17 DIAGNOSIS — M25562 Pain in left knee: Secondary | ICD-10-CM | POA: Diagnosis not present

## 2019-01-17 DIAGNOSIS — M1712 Unilateral primary osteoarthritis, left knee: Secondary | ICD-10-CM | POA: Diagnosis not present

## 2019-01-17 DIAGNOSIS — Z79899 Other long term (current) drug therapy: Secondary | ICD-10-CM | POA: Diagnosis not present

## 2019-01-20 DIAGNOSIS — Z1159 Encounter for screening for other viral diseases: Secondary | ICD-10-CM | POA: Diagnosis not present

## 2019-01-20 DIAGNOSIS — M1712 Unilateral primary osteoarthritis, left knee: Secondary | ICD-10-CM | POA: Diagnosis not present

## 2019-01-20 DIAGNOSIS — R52 Pain, unspecified: Secondary | ICD-10-CM | POA: Diagnosis not present

## 2019-01-20 DIAGNOSIS — Z01812 Encounter for preprocedural laboratory examination: Secondary | ICD-10-CM | POA: Diagnosis not present

## 2019-01-25 DIAGNOSIS — Z79899 Other long term (current) drug therapy: Secondary | ICD-10-CM | POA: Diagnosis not present

## 2019-01-25 DIAGNOSIS — Z1159 Encounter for screening for other viral diseases: Secondary | ICD-10-CM | POA: Diagnosis not present

## 2019-01-25 DIAGNOSIS — M1712 Unilateral primary osteoarthritis, left knee: Secondary | ICD-10-CM | POA: Diagnosis not present

## 2019-01-25 DIAGNOSIS — Z7901 Long term (current) use of anticoagulants: Secondary | ICD-10-CM | POA: Diagnosis not present

## 2019-01-25 DIAGNOSIS — G8918 Other acute postprocedural pain: Secondary | ICD-10-CM | POA: Diagnosis not present

## 2019-01-25 DIAGNOSIS — I10 Essential (primary) hypertension: Secondary | ICD-10-CM | POA: Diagnosis not present

## 2019-01-25 DIAGNOSIS — I480 Paroxysmal atrial fibrillation: Secondary | ICD-10-CM | POA: Diagnosis not present

## 2019-01-25 DIAGNOSIS — I97791 Other intraoperative cardiac functional disturbances during other surgery: Secondary | ICD-10-CM | POA: Diagnosis not present

## 2019-01-25 DIAGNOSIS — Z6830 Body mass index (BMI) 30.0-30.9, adult: Secondary | ICD-10-CM | POA: Diagnosis not present

## 2019-01-25 DIAGNOSIS — Z9989 Dependence on other enabling machines and devices: Secondary | ICD-10-CM | POA: Diagnosis not present

## 2019-01-25 DIAGNOSIS — E669 Obesity, unspecified: Secondary | ICD-10-CM | POA: Diagnosis not present

## 2019-01-28 DIAGNOSIS — R253 Fasciculation: Secondary | ICD-10-CM | POA: Diagnosis not present

## 2019-01-28 DIAGNOSIS — M1712 Unilateral primary osteoarthritis, left knee: Secondary | ICD-10-CM | POA: Diagnosis not present

## 2019-01-28 DIAGNOSIS — R2689 Other abnormalities of gait and mobility: Secondary | ICD-10-CM | POA: Diagnosis not present

## 2019-01-28 DIAGNOSIS — R29898 Other symptoms and signs involving the musculoskeletal system: Secondary | ICD-10-CM | POA: Diagnosis not present

## 2019-01-31 DIAGNOSIS — Z79899 Other long term (current) drug therapy: Secondary | ICD-10-CM | POA: Diagnosis not present

## 2019-01-31 DIAGNOSIS — M1712 Unilateral primary osteoarthritis, left knee: Secondary | ICD-10-CM | POA: Diagnosis not present

## 2019-02-02 DIAGNOSIS — Z79899 Other long term (current) drug therapy: Secondary | ICD-10-CM | POA: Diagnosis not present

## 2019-02-02 DIAGNOSIS — M1712 Unilateral primary osteoarthritis, left knee: Secondary | ICD-10-CM | POA: Diagnosis not present

## 2019-02-04 MED ORDER — RIVAROXABAN 20 MG PO TABS: 20.0000 mg | ORAL_TABLET | Freq: Every day | ORAL | 6 refills | Status: DC

## 2019-02-08 DIAGNOSIS — Z79899 Other long term (current) drug therapy: Secondary | ICD-10-CM | POA: Diagnosis not present

## 2019-02-08 DIAGNOSIS — M1712 Unilateral primary osteoarthritis, left knee: Secondary | ICD-10-CM | POA: Diagnosis not present

## 2019-02-09 DIAGNOSIS — Z96652 Presence of left artificial knee joint: Secondary | ICD-10-CM | POA: Diagnosis not present

## 2019-02-10 DIAGNOSIS — M1712 Unilateral primary osteoarthritis, left knee: Secondary | ICD-10-CM | POA: Diagnosis not present

## 2019-02-10 DIAGNOSIS — Z79899 Other long term (current) drug therapy: Secondary | ICD-10-CM | POA: Diagnosis not present

## 2019-02-14 DIAGNOSIS — Z79899 Other long term (current) drug therapy: Secondary | ICD-10-CM | POA: Diagnosis not present

## 2019-02-14 DIAGNOSIS — M1712 Unilateral primary osteoarthritis, left knee: Secondary | ICD-10-CM | POA: Diagnosis not present

## 2019-02-16 ENCOUNTER — Other Ambulatory Visit: Payer: Self-pay

## 2019-02-16 ENCOUNTER — Other Ambulatory Visit: Payer: Self-pay | Admitting: Cardiology

## 2019-02-16 DIAGNOSIS — M1712 Unilateral primary osteoarthritis, left knee: Secondary | ICD-10-CM | POA: Diagnosis not present

## 2019-02-16 DIAGNOSIS — Z79899 Other long term (current) drug therapy: Secondary | ICD-10-CM | POA: Diagnosis not present

## 2019-02-16 MED ORDER — CARVEDILOL 6.25 MG PO TABS: 6.2500 mg | ORAL_TABLET | Freq: Two times a day (BID) | ORAL | 2 refills | Status: DC

## 2019-02-16 MED ORDER — RIVAROXABAN 20 MG PO TABS: 20.0000 mg | ORAL_TABLET | Freq: Every day | ORAL | 1 refills | Status: DC

## 2019-02-16 NOTE — Telephone Encounter (Signed)
Pt last saw Dr Curt Bears on 09/03/18, last labs 01/26/19 Creat 1.17, age 63, weight 111.1kg, CrCl 101.55, based on CrCl pt is on appropriate dosage of Xarelto 20mg  QD.  Will refill rx.

## 2019-02-21 DIAGNOSIS — M1712 Unilateral primary osteoarthritis, left knee: Secondary | ICD-10-CM | POA: Diagnosis not present

## 2019-02-21 DIAGNOSIS — Z79899 Other long term (current) drug therapy: Secondary | ICD-10-CM | POA: Diagnosis not present

## 2019-02-22 ENCOUNTER — Other Ambulatory Visit (HOSPITAL_COMMUNITY)
Admission: RE | Admit: 2019-02-22 | Discharge: 2019-02-22 | Disposition: A | Discharge status: Home / Self Care | Payer: BC Managed Care – PPO | Source: Ambulatory Visit | Attending: Cardiology | Admitting: Cardiology

## 2019-02-22 DIAGNOSIS — Z20828 Contact with and (suspected) exposure to other viral communicable diseases: Secondary | ICD-10-CM | POA: Diagnosis not present

## 2019-02-22 DIAGNOSIS — Z01812 Encounter for preprocedural laboratory examination: Secondary | ICD-10-CM | POA: Diagnosis not present

## 2019-02-22 LAB — SARS CORONAVIRUS 2 (TAT 6-24 HRS): SARS Coronavirus 2: NEGATIVE

## 2019-02-23 DIAGNOSIS — Z79899 Other long term (current) drug therapy: Secondary | ICD-10-CM | POA: Diagnosis not present

## 2019-02-23 DIAGNOSIS — M1712 Unilateral primary osteoarthritis, left knee: Secondary | ICD-10-CM | POA: Diagnosis not present

## 2019-02-24 NOTE — Progress Notes (Signed)
Spoke with patient states has quarantined without symptoms, aware to be NPO p MD, has not missed any doses of Xarelto.  Arrival time 0830.

## 2019-02-25 ENCOUNTER — Encounter (HOSPITAL_COMMUNITY): Payer: Self-pay | Admitting: *Deleted

## 2019-02-25 ENCOUNTER — Ambulatory Visit (HOSPITAL_COMMUNITY): Payer: BC Managed Care – PPO | Admitting: Anesthesiology

## 2019-02-25 ENCOUNTER — Ambulatory Visit (HOSPITAL_COMMUNITY)
Admission: RE | Admit: 2019-02-25 | Discharge: 2019-02-25 | Disposition: A | Discharge status: Home / Self Care | Payer: BC Managed Care – PPO | Attending: Cardiology | Admitting: Cardiology

## 2019-02-25 ENCOUNTER — Encounter (HOSPITAL_COMMUNITY)
Admission: RE | Disposition: A | Discharge status: Home / Self Care | Payer: BLUE CROSS/BLUE SHIELD | Source: Home / Self Care | Attending: Cardiology

## 2019-02-25 DIAGNOSIS — G4733 Obstructive sleep apnea (adult) (pediatric): Secondary | ICD-10-CM | POA: Insufficient documentation

## 2019-02-25 DIAGNOSIS — Z79899 Other long term (current) drug therapy: Secondary | ICD-10-CM | POA: Insufficient documentation

## 2019-02-25 DIAGNOSIS — I4819 Other persistent atrial fibrillation: Secondary | ICD-10-CM

## 2019-02-25 DIAGNOSIS — Z7901 Long term (current) use of anticoagulants: Secondary | ICD-10-CM | POA: Diagnosis not present

## 2019-02-25 DIAGNOSIS — I252 Old myocardial infarction: Secondary | ICD-10-CM | POA: Diagnosis not present

## 2019-02-25 DIAGNOSIS — I10 Essential (primary) hypertension: Secondary | ICD-10-CM | POA: Insufficient documentation

## 2019-02-25 DIAGNOSIS — I480 Paroxysmal atrial fibrillation: Secondary | ICD-10-CM | POA: Diagnosis not present

## 2019-02-25 HISTORY — PX: CARDIOVERSION: SHX1299

## 2019-02-25 LAB — POCT I-STAT, CHEM 8
BUN: 21 mg/dL (ref 8–23)
Calcium, Ion: 1.23 mmol/L (ref 1.15–1.40)
Chloride: 101 mmol/L (ref 98–111)
Creatinine, Ser: 1.1 mg/dL (ref 0.61–1.24)
Glucose, Bld: 89 mg/dL (ref 70–99)
HCT: 40 % (ref 39.0–52.0)
Hemoglobin: 13.6 g/dL (ref 13.0–17.0)
Potassium: 4.2 mmol/L (ref 3.5–5.1)
Sodium: 139 mmol/L (ref 135–145)
TCO2: 25 mmol/L (ref 22–32)

## 2019-02-25 SURGERY — CARDIOVERSION
Anesthesia: General

## 2019-02-25 MED ORDER — LIDOCAINE 2% (20 MG/ML) 5 ML SYRINGE
INTRAMUSCULAR | Status: DC | PRN
  Administered 2019-02-25: 80 mg via INTRAVENOUS

## 2019-02-25 MED ORDER — PROPOFOL 10 MG/ML IV BOLUS
INTRAVENOUS | Status: DC | PRN
  Administered 2019-02-25: 100 mg via INTRAVENOUS

## 2019-02-25 MED ORDER — SODIUM CHLORIDE 0.9 % IV SOLN
INTRAVENOUS | Status: DC | PRN
  Administered 2019-02-25: 10:00:00 via INTRAVENOUS

## 2019-02-25 NOTE — Anesthesia Postprocedure Evaluation (Signed)
Anesthesia Post Note  Patient: Adrian Clark  Procedure(s) Performed: CARDIOVERSION (N/A )     Patient location during evaluation: PACU Anesthesia Type: General Level of consciousness: awake and alert Pain management: pain level controlled Vital Signs Assessment: post-procedure vital signs reviewed and stable Respiratory status: spontaneous breathing, nonlabored ventilation, respiratory function stable and patient connected to nasal cannula oxygen Cardiovascular status: blood pressure returned to baseline and stable Postop Assessment: no apparent nausea or vomiting Anesthetic complications: no    Last Vitals:  Vitals:   02/25/19 1012 02/25/19 1018  BP: 107/78 119/88  Pulse: 63   Resp: 19 18  Temp:    SpO2: 96% 98%    Last Pain:  Vitals:   02/25/19 0845  TempSrc: Temporal  PainSc: 0-No pain                 Fredick Schlosser

## 2019-02-25 NOTE — Interval H&P Note (Signed)
History and Physical Interval Note:  02/25/2019 8:50 AM  Adrian Clark  has presented today for surgery, with the diagnosis of atrial fibrillation.  The various methods of treatment have been discussed with the patient and family. After consideration of risks, benefits and other options for treatment, the patient has consented to  Procedure(s): CARDIOVERSION (N/A) as a surgical intervention.  The patient's history has been reviewed, patient examined, no change in status, stable for surgery.  I have reviewed the patient's chart and labs.  Questions were answered to the patient's satisfaction.     Kirk Ruths

## 2019-02-25 NOTE — Procedures (Signed)
Electrical Cardioversion Procedure Note Adrian Clark OS:[id number] October 06, 1955  Procedure: Electrical Cardioversion Indications:  Atrial Fibrillation  Procedure Details Consent: Risks of procedure as well as the alternatives and risks of each were explained to the (patient/caregiver).  Consent for procedure obtained. Time Out: Verified patient identification, verified procedure, site/side was marked, verified correct patient position, special equipment/implants available, medications/allergies/relevent history reviewed, required imaging and test results available.  Performed  Patient placed on cardiac monitor, pulse oximetry, supplemental oxygen as necessary.  Sedation given: Pt sedated by anesthesia with lidocaine 80 mg and diprovan 120 mg IV. Pacer pads placed anterior and posterior chest.  Cardioverted 1 time(s).  Cardioverted at 120J.  Evaluation Findings: Post procedure EKG shows: NSR Complications: None Patient did tolerate procedure well.   Kirk Ruths 02/25/2019, 8:51 AM

## 2019-02-25 NOTE — Anesthesia Procedure Notes (Signed)
Procedure Name: General with mask airway Date/Time: 02/25/2019 9:50 AM Performed by: Elayne Snare, CRNA Pre-anesthesia Checklist: Patient identified, Emergency Drugs available, Suction available and Patient being monitored Patient Re-evaluated:Patient Re-evaluated prior to induction Oxygen Delivery Method: Ambu bag Preoxygenation: Pre-oxygenation with 100% oxygen

## 2019-02-25 NOTE — Anesthesia Preprocedure Evaluation (Addendum)
Anesthesia Evaluation  Patient identified by MRN, date of birth, ID band Patient awake    Reviewed: Allergy & Precautions, NPO status , Patient's Chart, lab work & pertinent test results, reviewed documented beta blocker date and time   History of Anesthesia Complications Negative for: history of anesthetic complications  Airway Mallampati: III  TM Distance: >3 FB Neck ROM: Full    Dental  (+) Dental Advisory Given, Teeth Intact   Pulmonary sleep apnea and Continuous Positive Airway Pressure Ventilation ,    breath sounds clear to auscultation       Cardiovascular hypertension, Pt. on medications and Pt. on home beta blockers (-) angina+ dysrhythmias Atrial Fibrillation  Rhythm:Irregular Rate:Tachycardia   '17 Exercise stress test - Blood pressure demonstrated a normal response to exercise. There was no ST segment deviation noted during stress. No T wave inversion was noted during stress. Heart rate response inadequate for diagnosis.    Neuro/Psych negative neurological ROS  negative psych ROS   GI/Hepatic negative GI ROS, Neg liver ROS,   Endo/Other   Obesity   Renal/GU negative Renal ROS     Musculoskeletal negative musculoskeletal ROS (+)   Abdominal   Peds  Hematology negative hematology ROS (+)   Anesthesia Other Findings   Reproductive/Obstetrics                            Anesthesia Physical  Anesthesia Plan  ASA: III  Anesthesia Plan: General   Post-op Pain Management:    Induction: Intravenous  PONV Risk Score and Plan: 2 and Treatment may vary due to age or medical condition and Propofol infusion  Airway Management Planned: Mask and Natural Airway  Additional Equipment: None  Intra-op Plan:   Post-operative Plan:   Informed Consent: I have reviewed the patients History and Physical, chart, labs and discussed the procedure including the risks, benefits and  alternatives for the proposed anesthesia with the patient or authorized representative who has indicated his/her understanding and acceptance.       Plan Discussed with: CRNA and Anesthesiologist  Anesthesia Plan Comments:         Anesthesia Quick Evaluation

## 2019-02-25 NOTE — Discharge Instructions (Signed)

## 2019-02-25 NOTE — H&P (Signed)
Office Visit  09/03/2018 [facility] [address]   [place], Ocie Doyne, MD Cardiology  PAF (paroxysmal atrial fibrillation) [facility]) Dx  Referred by Corrington, Kip A, MD Reason for Visit  Additional Documentation  Vitals:   BP 146/88   Pulse 59   Ht 6' 3.5" (1.918 m)  Wt 245 lb (111.1 kg)  SpO2 98%  BMI 30.22 kg/m  BSA 2.43 m  Flowsheets:   MEWS Score,  Anthropometrics    Encounter Info:   Billing Info,  History,  Allergies,  Detailed Report    All Notes  Progress Notes by Constance Haw, MD at 09/03/2018 8:45 AM Author: Constance Haw, MD Author Type: Physician Filed: 09/03/2018 8:49 AM  Note Status: Signed Cosign: Cosign Not Required Encounter Date: 09/03/2018  Editor: Constance Haw, MD (Physician)  Expand All Collapse All      Electrophysiology Office Note   Date:  09/03/2018   ID:  Adrian Clark, DOB December 28, 1955, MRN [id number]  PCP:  [name], MD   Cardiologist:  [name] Primary Electrophysiologist:  Will Meredith Leeds, MD       No chief complaint on file.    History of Present Illness: Adrian Clark is a 63 y.o. male who presents today for electrophysiology evaluation.   History of PAF s/pstatus post ablation in 2013 [facility] in [place], [place]), OSA, HTN. Admitted in 1/15 with AF with RVR. He had LA thrombus on TEE. He eventually converted to NSR on his own. Patient was back in atrial fibrillation 1/2017CHADS2-VASc=1 (HTN). He was placed on Xarelto 20 mg QD with plans to proceed with DCCV after 4 weeks of uninterrupted anticoagulation. He underwent DCCV 08/20/15. Had repeat ablation on 06/06/16. 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 Since that time, was started on and has stopped flecainide.   Today, denies symptoms of palpitations, chest pain, shortness of breath, orthopnea, PND, lower extremity edema, claudication, dizziness, presyncope, syncope, bleeding, or neurologic  sequela. The patient is tolerating medications without difficulties.  Overall he is doing well.  He has no chest pain or shortness of breath.  He is able to do all of his daily activities.  He did have a cardioversion 08/02/2018 and remains in sinus rhythm.      Past Medical History:  Diagnosis Date  . HTN (hypertension)   . OSA (obstructive sleep apnea)   . PAF (paroxysmal atrial fibrillation) ([place]) 2013   s/p ablation         Past Surgical History:  Procedure Laterality Date  . ABLATION     AFIB  . CARDIOVERSION N/A 07/11/2013   Procedure: CARDIOVERSION;  Surgeon: [name], MD;  Location: [facility] ENDOSCOPY;  Service: Cardiovascular;  Laterality: N/A;  . CARDIOVERSION N/A 08/20/2015   Procedure: CARDIOVERSION;  Surgeon: [name], MD;  Location: [place] ENDOSCOPY;  Service: Cardiovascular;  Laterality: N/A;  . CARDIOVERSION N/A 06/20/2016   Procedure: CARDIOVERSION;  Surgeon: [name], MD;  Location: [place];  Service: Cardiovascular;  Laterality: N/A;  . CARDIOVERSION N/A 08/02/2018   Procedure: CARDIOVERSION;  Surgeon: [name], MD;  Location: [facility] ENDOSCOPY;  Service: Cardiovascular;  Laterality: N/A;  . CHOLECYSTECTOMY    . COLONOSCOPY N/A 02/06/2017   Procedure: COLONOSCOPY;  Surgeon: [name], MD;  Location: [facility] ENDOSCOPY;  Service: Endoscopy;  Laterality: N/A;  . ELECTROPHYSIOLOGIC STUDY N/A 06/06/2016   Procedure: Atrial Fibrillation Ablation;  Surgeon: Will Meredith Leeds, MD;  Location: [place] CV LAB;  Service:  Cardiovascular;  Laterality: N/A;  . TEE WITHOUT CARDIOVERSION N/A 07/11/2013   Procedure: TRANSESOPHAGEAL ECHOCARDIOGRAM (TEE);  Surgeon: [name], MD;  Location: [facility] ENDOSCOPY;  Service: Cardiovascular;  Laterality: N/A;  . TONSILLECTOMY    . VASECTOMY             Current Outpatient Medications  Medication Sig Dispense Refill  . amLODipine-benazepril (LOTREL) 10-40 MG capsule Take 1 capsule by mouth daily. 90  capsule 2  . rivaroxaban (XARELTO) 20 MG TABS tablet Take 1 tablet (20 mg total) by mouth daily with supper. 90 tablet 2  . carvedilol (COREG) 6.25 MG tablet Take 1 tablet (6.25 mg total) by mouth 2 (two) times daily. 180 tablet 2   No current facility-administered medications for this visit.     Allergies:   Patient has no known allergies.   Social History:  The patient  reports that he has never smoked. He has never used smokeless tobacco. He reports current alcohol use of about 2.0 standard drinks of alcohol per week. He reports that he does not use drugs.   Family History:  The patient's family history includes Prostate cancer in his father; Uterine cancer in his mother.   ROS:  Please see the history of present illness.   Otherwise, review of systems is positive for none.   All other systems are reviewed and negative.   PHYSICAL EXAM: VS:  BP (!) 146/88   Pulse (!) 59   Ht 6' 3.5" (1.918 m)   Wt 245 lb (111.1 kg)   SpO2 98%   BMI 30.22 kg/m  , BMI Body mass index is 30.22 kg/m. GEN: Well nourished, well developed, in no acute distress  HEENT: normal  Neck: no JVD, carotid bruits, or masses Cardiac: RRR; no murmurs, rubs, or gallops,no edema  Respiratory:  clear to auscultation bilaterally, normal work of breathing GI: soft, nontender, nondistended, + BS MS: no deformity or atrophy  Skin: warm and dry Neuro:  Strength and sensation are intact Psych: euthymic mood, full affect  EKG:  EKG is ordered today. Personal review of the ekg ordered shows sinus rhythm, rate 59  Recent Labs: 07/28/2018: BUN 24; Creatinine, Ser 1.07; Hemoglobin 15.3; Platelets 302; Potassium 4.3; Sodium 141    Lipid Panel  Labs (Brief)          Component Value Date/Time   CHOL 177 05/27/2016 0844   TRIG 106 05/27/2016 0844   HDL 53 05/27/2016 0844   CHOLHDL 3.3 05/27/2016 0844   VLDL 21 05/27/2016 0844   LDLCALC 103 (H) 05/27/2016 0844          Wt Readings from Last 3  Encounters:  09/03/18 245 lb (111.1 kg)  08/02/18 251 lb 6 oz (114 kg)  07/26/18 251 lb 6 oz (114 kg)      Other studies Reviewed: Additional studies/ records that were reviewed today include: TEE 07/11/13  Review of the above records today demonstrates:  - Left ventricle: Systolic function was normal. The estimated ejection fraction was in the range of 55% to 60%. Wall motion was normal; there were no regional wall motion abnormalities. - Aortic valve: Trivial regurgitation. - Mitral valve: Mild regurgitation. - Left atrium: The atrium was moderately dilated. No evidence of thrombus in the atrial cavity. There was a thrombus. Cannot exclude thrombus in the appendage. There was spontaneous echo contrast ("smoke"). - Right atrium: No evidence of thrombus in the atrial cavity or appendage.   ASSESSMENT AND PLAN:  1.  Persistent atrial  fibrillation: Post ablation 06/06/2016 though unable to isolate the right superior pulmonary vein.  Had a cardioversion 08/02/2018.  He remains in sinus rhythm.  Since he has been in sinus rhythm, will plan for holding his Xarelto as he has had a low stroke risk.      This patients CHA2DS2-VASc Score and unadjusted Ischemic Stroke Rate (% per year) is equal to 0.6 % stroke rate/year from a score of 1  Above score calculated as 1 point each if present [CHF, HTN, DM, Vascular=MI/PAD/Aortic Plaque, Age if 65-74, or Male] Above score calculated as 2 points each if present [Age > 75, or Stroke/TIA/TE]   2. OSA: Compliant with CPAP  3. Hypertension: Blood pressure is elevated today and has been in the 130s at home.  Due to that, we will start him on carvedilol and stop his Toprol-XL.    Current medicines are reviewed at length with the patient today.   The patient does not have concerns regarding his medicines.  The following changes were made today: Stop Toprol-XL, start carvedilol  Labs/ tests ordered today include:      Orders Placed This Encounter  Procedures  . EKG 12-Lead     Disposition:   FU with Will Camnitz 6 months  Signed, Will Meredith Leeds, MD  09/03/2018 8:48 AM     [facility] [address] [place] [address] [phone] (office) [phone] (fax)     Pt contacted the office with recurrent atrial fibrillation and scheduled for DCCV; he has been on xarelto for >21 days uninterrupted. No other changes. Kirk Ruths

## 2019-02-25 NOTE — Transfer of Care (Signed)
Immediate Anesthesia Transfer of Care Note  Patient: Adrian Clark  Procedure(s) Performed: CARDIOVERSION (N/A )  Patient Location: Endoscopy Unit  Anesthesia Type:General  Level of Consciousness: awake, alert  and patient cooperative  Airway & Oxygen Therapy: Patient Spontanous Breathing  Post-op Assessment: Report given to RN and Post -op Vital signs reviewed and stable  Post vital signs: Reviewed and stable  Last Vitals:  Vitals Value Taken Time  BP    Temp    Pulse 63 02/25/19 1000  Resp 21 02/25/19 1000  SpO2 98 % 02/25/19 1000    Last Pain:  Vitals:   02/25/19 0845  TempSrc: Temporal  PainSc: 0-No pain         Complications: No apparent anesthesia complications

## 2019-02-27 ENCOUNTER — Encounter (HOSPITAL_COMMUNITY): Payer: Self-pay | Admitting: Cardiology

## 2019-03-01 DIAGNOSIS — R269 Unspecified abnormalities of gait and mobility: Secondary | ICD-10-CM | POA: Diagnosis not present

## 2019-03-01 DIAGNOSIS — M6281 Muscle weakness (generalized): Secondary | ICD-10-CM | POA: Diagnosis not present

## 2019-03-01 DIAGNOSIS — M1712 Unilateral primary osteoarthritis, left knee: Secondary | ICD-10-CM | POA: Diagnosis not present

## 2019-03-01 DIAGNOSIS — R29898 Other symptoms and signs involving the musculoskeletal system: Secondary | ICD-10-CM | POA: Diagnosis not present

## 2019-03-03 DIAGNOSIS — R269 Unspecified abnormalities of gait and mobility: Secondary | ICD-10-CM | POA: Diagnosis not present

## 2019-03-03 DIAGNOSIS — R29898 Other symptoms and signs involving the musculoskeletal system: Secondary | ICD-10-CM | POA: Diagnosis not present

## 2019-03-03 DIAGNOSIS — M6281 Muscle weakness (generalized): Secondary | ICD-10-CM | POA: Diagnosis not present

## 2019-03-03 DIAGNOSIS — M1712 Unilateral primary osteoarthritis, left knee: Secondary | ICD-10-CM | POA: Diagnosis not present

## 2019-03-09 DIAGNOSIS — R269 Unspecified abnormalities of gait and mobility: Secondary | ICD-10-CM | POA: Diagnosis not present

## 2019-03-09 DIAGNOSIS — M6281 Muscle weakness (generalized): Secondary | ICD-10-CM | POA: Diagnosis not present

## 2019-03-09 DIAGNOSIS — M1712 Unilateral primary osteoarthritis, left knee: Secondary | ICD-10-CM | POA: Diagnosis not present

## 2019-03-09 DIAGNOSIS — R29898 Other symptoms and signs involving the musculoskeletal system: Secondary | ICD-10-CM | POA: Diagnosis not present

## 2019-03-11 DIAGNOSIS — M6281 Muscle weakness (generalized): Secondary | ICD-10-CM | POA: Diagnosis not present

## 2019-03-11 DIAGNOSIS — R29898 Other symptoms and signs involving the musculoskeletal system: Secondary | ICD-10-CM | POA: Diagnosis not present

## 2019-03-11 DIAGNOSIS — R269 Unspecified abnormalities of gait and mobility: Secondary | ICD-10-CM | POA: Diagnosis not present

## 2019-03-11 DIAGNOSIS — M1712 Unilateral primary osteoarthritis, left knee: Secondary | ICD-10-CM | POA: Diagnosis not present

## 2019-03-14 DIAGNOSIS — M6281 Muscle weakness (generalized): Secondary | ICD-10-CM | POA: Diagnosis not present

## 2019-03-14 DIAGNOSIS — R29898 Other symptoms and signs involving the musculoskeletal system: Secondary | ICD-10-CM | POA: Diagnosis not present

## 2019-03-14 DIAGNOSIS — R269 Unspecified abnormalities of gait and mobility: Secondary | ICD-10-CM | POA: Diagnosis not present

## 2019-03-14 DIAGNOSIS — M1712 Unilateral primary osteoarthritis, left knee: Secondary | ICD-10-CM | POA: Diagnosis not present

## 2019-03-16 DIAGNOSIS — M6281 Muscle weakness (generalized): Secondary | ICD-10-CM | POA: Diagnosis not present

## 2019-03-16 DIAGNOSIS — M1712 Unilateral primary osteoarthritis, left knee: Secondary | ICD-10-CM | POA: Diagnosis not present

## 2019-03-16 DIAGNOSIS — R269 Unspecified abnormalities of gait and mobility: Secondary | ICD-10-CM | POA: Diagnosis not present

## 2019-03-16 DIAGNOSIS — R29898 Other symptoms and signs involving the musculoskeletal system: Secondary | ICD-10-CM | POA: Diagnosis not present

## 2019-04-04 ENCOUNTER — Other Ambulatory Visit: Payer: Self-pay

## 2019-04-04 ENCOUNTER — Encounter: Payer: Self-pay | Admitting: Cardiology

## 2019-04-04 ENCOUNTER — Ambulatory Visit: Payer: BC Managed Care – PPO | Admitting: Cardiology

## 2019-04-04 VITALS — BP 144/88 | HR 56 | Ht 75.0 in | Wt 242.6 lb

## 2019-04-04 DIAGNOSIS — I4819 Other persistent atrial fibrillation: Secondary | ICD-10-CM | POA: Diagnosis not present

## 2019-04-04 MED ORDER — PROPAFENONE HCL ER 225 MG PO CP12: 225.0000 mg | ORAL_CAPSULE | Freq: Two times a day (BID) | ORAL | 6 refills | Status: DC

## 2019-04-04 NOTE — Progress Notes (Signed)
Electrophysiology Office Note   Date:  04/04/2019   ID:  Adrian Clark, Adrian Clark [birth year], MRN [id number]  PCP:  [name], MD  Cardiologist:  [name] Primary Electrophysiologist:  Tylor Courtwright Meredith Leeds, MD    No chief complaint on file.    History of Present Illness: Adrian Clark is a 63 y.o. male who presents today for electrophysiology evaluation.   History of PAF s/pstatus post ablation in 2013 [facility] in [place], [place]), OSA, HTN. Admitted in 1/15 with AF with RVR. He had LA thrombus on TEE. He eventually converted to NSR on his own.  Patient was back in atrial fibrillation 07/2015 CHADS2-VASc=1 (HTN). He was placed on Xarelto 20 mg QD with plans to proceed with DCCV after 4 weeks of uninterrupted anticoagulation. He underwent DCCV 08/20/15. Had repeat ablation on 06/06/16. 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 Since that time, was started on and has stopped flecainide.   Today, denies symptoms of palpitations, chest pain, shortness of breath, orthopnea, PND, lower extremity edema, claudication, dizziness, presyncope, syncope, bleeding, or neurologic sequela. The patient is tolerating medications without difficulties.  He feels well today.  He had knee surgery approximately 1 month ago.  Just prior to his operation, he went into atrial fibrillation.  He had a cardioversion 02/25/2019.  Since that time he is remained in sinus rhythm.  Past Medical History:  Diagnosis Date  . HTN (hypertension)   . OSA (obstructive sleep apnea)   . PAF (paroxysmal atrial fibrillation) ([place]) 2013   s/p ablation    Past Surgical History:  Procedure Laterality Date  . ABLATION     AFIB  . CARDIOVERSION N/A 07/11/2013   Procedure: CARDIOVERSION;  Surgeon: [name], MD;  Location: [place];  Service: Cardiovascular;  Laterality: N/A;  . CARDIOVERSION N/A 08/20/2015   Procedure: CARDIOVERSION;  Surgeon: [name], MD;  Location: [place]  ENDOSCOPY;  Service: Cardiovascular;  Laterality: N/A;  . CARDIOVERSION N/A 06/20/2016   Procedure: CARDIOVERSION;  Surgeon: [name], MD;  Location: [place];  Service: Cardiovascular;  Laterality: N/A;  . CARDIOVERSION N/A 08/02/2018   Procedure: CARDIOVERSION;  Surgeon: [name], MD;  Location: [facility] ENDOSCOPY;  Service: Cardiovascular;  Laterality: N/A;  . CARDIOVERSION N/A 02/25/2019   Procedure: CARDIOVERSION;  Surgeon: Lelon Perla, MD;  Location: [facility] ENDOSCOPY;  Service: Cardiovascular;  Laterality: N/A;  . CHOLECYSTECTOMY    . COLONOSCOPY N/A 02/06/2017   Procedure: COLONOSCOPY;  Surgeon: [name], MD;  Location: [facility] ENDOSCOPY;  Service: Endoscopy;  Laterality: N/A;  . ELECTROPHYSIOLOGIC STUDY N/A 06/06/2016   Procedure: Atrial Fibrillation Ablation;  Surgeon: Caterin Tabares Meredith Leeds, MD;  Location: [place] CV LAB;  Service: Cardiovascular;  Laterality: N/A;  . TEE WITHOUT CARDIOVERSION N/A 07/11/2013   Procedure: TRANSESOPHAGEAL ECHOCARDIOGRAM (TEE);  Surgeon: [name], MD;  Location: [facility] ENDOSCOPY;  Service: Cardiovascular;  Laterality: N/A;  . TONSILLECTOMY    . VASECTOMY       Current Outpatient Medications  Medication Sig Dispense Refill  . amLODipine-benazepril (LOTREL) 10-40 MG capsule Take 1 capsule by mouth daily. 90 capsule 2  . carvedilol (COREG) 6.25 MG tablet Take 1 tablet (6.25 mg total) by mouth 2 (two) times daily. 180 tablet 2  . rivaroxaban (XARELTO) 20 MG TABS tablet Take 1 tablet (20 mg total) by mouth daily with supper. 90 tablet 1   No current facility-administered medications for this visit.     Allergies:   Patient has no  known allergies.   Social History:  The patient  reports that he has never smoked. He has never used smokeless tobacco. He reports current alcohol use of about 2.0 standard drinks of alcohol per week. He reports that he does not use drugs.   Family History:  The patient's family history includes Prostate cancer  in his father; Uterine cancer in his mother.   ROS:  Please see the history of present illness.   Otherwise, review of systems is positive for none   All other systems are reviewed and negative.   PHYSICAL EXAM: VS:  BP (!) 144/88   Pulse (!) 56   Ht 6\' 3"  (1.905 m)   Wt 242 lb 9.6 oz (110 kg)   SpO2 96%   BMI 30.32 kg/m  , BMI Body mass index is 30.32 kg/m. GEN: Well nourished, well developed, in no acute distress  HEENT: normal  Neck: no JVD, carotid bruits, or masses Cardiac: RRR; no murmurs, rubs, or gallops,no edema  Respiratory:  clear to auscultation bilaterally, normal work of breathing GI: soft, nontender, nondistended, + BS MS: no deformity or atrophy  Skin: warm and dry Neuro:  Strength and sensation are intact Psych: euthymic mood, full affect  EKG:  EKG is ordered today. Personal review of the ekg ordered shows sinus rhythm, rate 56  Recent Labs: 07/28/2018: Platelets 302 02/25/2019: BUN 21; Creatinine, Ser 1.10; Hemoglobin 13.6; Potassium 4.2; Sodium 139    Lipid Panel     Component Value Date/Time   CHOL 177 05/27/2016 0844   TRIG 106 05/27/2016 0844   HDL 53 05/27/2016 0844   CHOLHDL 3.3 05/27/2016 0844   VLDL 21 05/27/2016 0844   LDLCALC 103 (H) 05/27/2016 0844     Wt Readings from Last 3 Encounters:  04/04/19 242 lb 9.6 oz (110 kg)  02/25/19 237 lb (107.5 kg)  09/03/18 245 lb (111.1 kg)      Other studies Reviewed: Additional studies/ records that were reviewed today include: TEE 07/11/13  Review of the above records today demonstrates:  - Left ventricle: Systolic function was normal. The estimated ejection fraction was in the range of 55% to 60%. Wall motion was normal; there were no regional wall motion abnormalities. - Aortic valve: Trivial regurgitation. - Mitral valve: Mild regurgitation. - Left atrium: The atrium was moderately dilated. No evidence of thrombus in the atrial cavity. There was a thrombus. Cannot exclude  thrombus in the appendage. There was spontaneous echo contrast ("smoke"). - Right atrium: No evidence of thrombus in the atrial cavity or appendage.   ASSESSMENT AND PLAN:  1.  Persistent atrial fibrillation: Post ablation 06/06/2016 though unable to isolate the right upper pulmonary vein.  On Xarelto.  Unfortunately continued to have episodes of atrial fibrillation with a recent cardioversion.  Due to his frequent episodes, Tron Flythe start him on propafenone 225 mg twice daily.  This patients CHA2DS2-VASc Score and unadjusted Ischemic Stroke Rate (% per year) is equal to 0.6 % stroke rate/year from a score of 1  Above score calculated as 1 point each if present [CHF, HTN, DM, Vascular=MI/PAD/Aortic Plaque, Age if 65-74, or Male] Above score calculated as 2 points each if present [Age > 75, or Stroke/TIA/TE]  2. OSA: CPAP compliance encouraged  3. Hypertension: Blood pressure elevated today.  He has white coat syndrome.  His blood pressures at home are in the low 100s.  No changes.   Current medicines are reviewed at length with the patient today.   The patient  does not have concerns regarding his medicines.  The following changes were made today: None  Labs/ tests ordered today include:  Orders Placed This Encounter  Procedures  . EKG 12-Lead     Disposition:   FU with Saiya Crist 12 months  Signed, Tianna Baus Meredith Leeds, MD  04/04/2019 8:37 AM     [facility] [address] [place] [address] [phone] (office) [phone] (fax)

## 2019-04-04 NOTE — Patient Instructions (Addendum)
Medication Instructions:  Your physician has recommended you make the following change in your medication:  1. START Propafenone 225 mg twice a day  * If you need a refill on your cardiac medications before your next appointment, please call your pharmacy.   Labwork: None ordered  Testing/Procedures: None ordered  Follow-Up: Your physician recommends that you schedule a follow-up appointment on: 04/21/2019 @ 2:00 pm for nurse visit EKG  Your physician wants you to follow-up in: 1 year with Dr. Curt Bears.  You will receive a reminder letter in the mail two months in advance. If you don't receive a letter, please call our office to schedule the follow-up appointment.  At [facility], you and your health needs are our priority.  As part of our continuing mission to provide you with exceptional heart care, we have created designated Provider Care Teams.  These Care Teams include your primary Cardiologist (physician) and Advanced Practice Providers (APPs -  Physician Assistants and Nurse Practitioners) who all work together to provide you with the care you need, when you need it.  You will need a follow up appointment in 6 months.  Please call our office 2 months in advance to schedule this appointment.  You may see one of the following Advanced Practice Providers on your designated Care Team:    Chanetta Marshall, NP  Tommye Standard, PA-C  Oda Kilts, [place]  Thank you for choosing [facility]!!   Trinidad Curet, RN [phone]  Any Other Special Instructions Will Be Listed Below (If Applicable).  Propafenone tablets What is this medicine? PROPAFENONE (proe pa FEEN one) is an antiarrhythmic agent. It is used to treat irregular heart rhythm and can slow rapid heartbeats. This medicine can help your heart to return to and maintain a normal rhythm. This medicine may be used for other purposes; ask your health care provider or pharmacist if you have questions. COMMON BRAND NAME(S):  Rythmol What should I tell my health care provider before I take this medicine? They need to know if you have any of these conditions:  heart disease  high blood levels of potassium  kidney disease  liver disease  low blood pressure  lung disease like asthma, chronic bronchitis or emphysema  pacemaker  slow heart rate  an unusual or allergic reaction to propafenone, other medicines, foods, dyes, or preservatives  pregnant or trying to get pregnant  breast-feeding How should I use this medicine? Take this medicine by mouth with a glass of water. Follow the directions on the prescription label. You can take this medicine with or without food. Take your doses at regular intervals. Do not take your medicine more often than directed. Do not stop taking except on the advice of your doctor or health care professional. Talk to your pediatrician regarding the use of this medicine in children. Special care may be needed. Overdosage: If you think you have taken too much of this medicine contact a poison control center or emergency room at once. NOTE: This medicine is only for you. Do not share this medicine with others. What if I miss a dose? If you miss a dose, take it as soon as you can. If it is almost time for your next dose, take only that dose. Do not take double or extra doses. What may interact with this medicine? Do not take this medicine with any of the following medications:  arsenic trioxide  certain antibiotics like clarithromycin, erythromycin, grepafloxacin, pentamidine, sparfloxacin, troleandomycin  certain medicines for depression or  mental illness like amoxapine, haloperidol, maprotiline, pimozide, sertindole, thioridazine, tricyclic antidepressants  certain medicines for fungal infections like fluconazole, itraconazole, ketoconazole, posaconazole, voriconazole  certain medicines for irregular heart beat like dronedarone  certain medicines for malaria like  chloroquine, halofantrine  cisapride  droperidol  levomethadyl  ranolazine This medicine may also interact with the following medications:  certain medicines for angina or blood pressure  certain medicines for asthma or breathing difficulties like formoterol, salmeterol  certain medicines that treat or prevent blood clots like warfarin  cimetidine  cyclosporine  digoxin  diuretics  local anesthetics  other medicines that prolong the QT interval (cause an abnormal heart rhythm) like dofetilide, ziprasidone  rifampin  ritonavir  theophylline This list may not describe all possible interactions. Give your health care provider a list of all the medicines, herbs, non-prescription drugs, or dietary supplements you use. Also tell them if you smoke, drink alcohol, or use illegal drugs. Some items may interact with your medicine. What should I watch for while using this medicine? Your condition will be monitored closely when you first begin therapy. Often, this drug is first started in a hospital or other monitored health care setting. Once you are on maintenance therapy, visit your doctor or health care professional for regular checks on your progress. Because your condition and use of this medicine carry some risk, it is a good idea to carry an identification card, necklace or bracelet with details of your condition, medications, and doctor or health care professional. Dennis Bast may get drowsy or dizzy. Do not drive, use machinery, or do anything that needs mental alertness until you know how this medicine affects you. Do not stand or sit up quickly, especially if you are an older patient. This reduces the risk of dizzy or fainting spells. If you are going to have surgery, tell your doctor or health care professional that you are taking this medicine. What side effects may I notice from receiving this medicine? Side effects that you should report to your doctor or health care professional  as soon as possible:  chest pain, palpitations  fever or chills  shortness of breath  swelling of feet or legs  trembling or shaking Side effects that usually do not require medical attention (report to your doctor or health care professional if they continue or are bothersome):  blurred vision  changes in taste (a metallic or bitter taste)  constipation or diarrhea  dry mouth  headache  nausea or vomiting  tiredness or weakness This list may not describe all possible side effects. Call your doctor for medical advice about side effects. You may report side effects to FDA at 1-800-FDA-1088. Where should I keep my medicine? Keep out of the reach of children. Store at room temperature between 15 and 30 degrees C (59 and 86 degrees F). Protect from light. Keep container tightly closed. Throw away any unused medicine after the expiration date. NOTE: This sheet is a summary. It may not cover all possible information. If you have questions about this medicine, talk to your doctor, pharmacist, or health care provider.  2020 [employer] (2018-06-08 08:51:33)

## 2019-04-21 ENCOUNTER — Other Ambulatory Visit: Payer: Self-pay

## 2019-04-21 ENCOUNTER — Ambulatory Visit (INDEPENDENT_AMBULATORY_CARE_PROVIDER_SITE_OTHER): Payer: BC Managed Care – PPO

## 2019-04-21 VITALS — BP 136/82 | HR 67 | Ht 75.0 in | Wt 250.0 lb

## 2019-04-21 DIAGNOSIS — I4819 Other persistent atrial fibrillation: Secondary | ICD-10-CM

## 2019-04-21 NOTE — Patient Instructions (Signed)
Medication Instructions:  NO CHANGES WITH MEDICATION *If you need a refill on your cardiac medications before your next appointment, please call your pharmacy*  Lab Work: NO LABS ORDERED  If you have labs (blood work) drawn today and your tests are completely normal, you will receive your results only by: Marland Kitchen MyChart Message (if you have MyChart) OR . A paper copy in the mail If you have any lab test that is abnormal or we need to change your treatment, we will call you to review the results.  Testing/Procedures: NO TEST ORDERED  Follow-Up: At [facility], you and your health needs are our priority.  As part of our continuing mission to provide you with exceptional heart care, we have created designated Provider Care Teams.  These Care Teams include your primary Cardiologist (physician) and Advanced Practice Providers (APPs -  Physician Assistants and Nurse Practitioners) who all work together to provide you with the care you need, when you need it.  WE WILL SEND OUT A REMINDER LETTER AROUND JAN/FEB FOR APPT WITH Joesph July, PAC   Other Instructions DR. KLEIN REVIEWED EKG FOR DR. Curt Bears, NEW START PROPAFENONE. NO CHANGES TO BE MADE WITH MEDICATIONS TODAY PER DR. KLEIN. WILL FORWARD EKG AND NOTES TO DR. Curt Bears. WILL CALL YOU IF DR. CAMNITZ HAS ANY NEW RECOMMENDATIONS.

## 2019-04-21 NOTE — Progress Notes (Signed)
Patient presented today for a follow-up after starting Proafenone 225 mg BID. Patient has no complaints and reports feeling good. EKG done and reviewed by Dr. Caryl Comes. No changed will be made. EKG and notes will be forwarded to Dr. Curt Bears for review and will call patient if any changes should be made.

## 2019-05-17 ENCOUNTER — Encounter (HOSPITAL_COMMUNITY): Payer: Self-pay | Admitting: Nurse Practitioner

## 2019-05-17 ENCOUNTER — Ambulatory Visit (HOSPITAL_COMMUNITY)
Admission: RE | Admit: 2019-05-17 | Discharge: 2019-05-17 | Disposition: A | Discharge status: Home / Self Care | Payer: BC Managed Care – PPO | Source: Ambulatory Visit | Attending: Nurse Practitioner | Admitting: Nurse Practitioner

## 2019-05-17 ENCOUNTER — Other Ambulatory Visit: Payer: Self-pay

## 2019-05-17 VITALS — BP 150/90 | HR 101 | Ht 75.0 in | Wt 244.3 lb

## 2019-05-17 DIAGNOSIS — I4819 Other persistent atrial fibrillation: Secondary | ICD-10-CM

## 2019-05-17 DIAGNOSIS — I4892 Unspecified atrial flutter: Secondary | ICD-10-CM | POA: Diagnosis not present

## 2019-05-17 DIAGNOSIS — D6869 Other thrombophilia: Secondary | ICD-10-CM | POA: Diagnosis not present

## 2019-05-17 DIAGNOSIS — Z7901 Long term (current) use of anticoagulants: Secondary | ICD-10-CM | POA: Insufficient documentation

## 2019-05-17 DIAGNOSIS — Z20828 Contact with and (suspected) exposure to other viral communicable diseases: Secondary | ICD-10-CM | POA: Diagnosis not present

## 2019-05-17 DIAGNOSIS — I252 Old myocardial infarction: Secondary | ICD-10-CM | POA: Diagnosis not present

## 2019-05-17 DIAGNOSIS — Z01818 Encounter for other preprocedural examination: Secondary | ICD-10-CM | POA: Insufficient documentation

## 2019-05-17 DIAGNOSIS — Z79899 Other long term (current) drug therapy: Secondary | ICD-10-CM | POA: Insufficient documentation

## 2019-05-17 DIAGNOSIS — I10 Essential (primary) hypertension: Secondary | ICD-10-CM | POA: Insufficient documentation

## 2019-05-17 LAB — BASIC METABOLIC PANEL
Anion gap: 9 (ref 5–15)
BUN: 21 mg/dL (ref 8–23)
CO2: 24 mmol/L (ref 22–32)
Calcium: 8.8 mg/dL — ABNORMAL LOW (ref 8.9–10.3)
Chloride: 103 mmol/L (ref 98–111)
Creatinine, Ser: 1.01 mg/dL (ref 0.61–1.24)
GFR calc Af Amer: >60 mL/min (ref >60)
GFR calc non Af Amer: >60 mL/min (ref >60)
Glucose, Bld: 98 mg/dL (ref 70–99)
Potassium: 4.1 mmol/L (ref 3.5–5.1)
Sodium: 136 mmol/L (ref 135–145)

## 2019-05-17 LAB — CBC
HCT: 46.3 % (ref 39.0–52.0)
Hemoglobin: 15.1 g/dL (ref 13.0–17.0)
MCH: 28 pg (ref 26.0–34.0)
MCHC: 32.6 g/dL (ref 30.0–36.0)
MCV: 85.9 fL (ref 80.0–100.0)
Platelets: 321 10*3/uL (ref 150–400)
RBC: 5.39 MIL/uL (ref 4.22–5.81)
RDW: 13.6 % (ref 11.5–15.5)
WBC: 5.8 10*3/uL (ref 4.0–10.5)
nRBC: 0 % (ref 0.0–0.2)

## 2019-05-17 MED ORDER — PROPAFENONE HCL ER 325 MG PO CP12: 325.0000 mg | ORAL_CAPSULE | Freq: Two times a day (BID) | ORAL | 6 refills | Status: DC

## 2019-05-17 NOTE — Patient Instructions (Addendum)
Increase Rythmol 325mg  twice a day and discontinue 225mg   Cardioversion scheduled for Monday 05/23/2019  - Arrive at the [facility] and go to admitting at [place] not eat or drink anything after midnight the night prior to your procedure.  - Take all your morning medication with a sip of water prior to arrival.  - You will not be able to drive home after your procedure.

## 2019-05-17 NOTE — Progress Notes (Signed)
Primary Care Physician: [name], MD Referring Physician: Dr. Reggy Eye Adrian Clark is a 63 y.o. male with a h/o  PAF s/pstatus post ablation in 2013 [facility] in [place], [place]), OSA, HTN. Admitted in 1/15 with AF with RVR. He had LA thrombus on TEE. He eventually converted to NSR on his own. Patient was back in atrial fibrillation 1/2017CHADS2-VASc=1 (HTN). He was placed on Xarelto 20 mg QD with plans to proceed with DCCV after 4 weeks of uninterrupted anticoagulation. He underwent DCCV 08/20/15. Had repeat ablation on 06/06/16. 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 Since that time, was started on and has stopped flecainide.   He had a successful cardioversion in August of 2020. He was placed on Rythmol in October by Dr. Curt Bears. He now is being seen in the afib clinic since starting back in afib on 11/11. He was off xarelto but did start back on 11/11 after returning to afib that day.  He is very reliable to know when he is in afib and shows me on his watch when his HR's escalated on that day.   I discussed with Dr. Curt Bears and he feels comfortable with pt going for cardioversion without TEE since he went back on xarelto the day he noted afib. He recommends increasing  Rythmol to 325 mg bid and plan on cardioversion. If this is not successful he discussed tikoyn or he would  try repeat ablation.   Today, he denies symptoms of  chest pain, shortness of breath, orthopnea, PND, lower extremity edema, dizziness, presyncope, syncope, or neurologic sequela. The patient is tolerating medications without difficulties and is otherwise without complaint today.   Past Medical History:  Diagnosis Date  . HTN (hypertension)   . OSA (obstructive sleep apnea)   . PAF (paroxysmal atrial fibrillation) ([place]) 2013   s/p ablation    Past Surgical History:  Procedure Laterality Date  . ABLATION     AFIB  . CARDIOVERSION N/A 07/11/2013   Procedure:  CARDIOVERSION;  Surgeon: [name], MD;  Location: [place];  Service: Cardiovascular;  Laterality: N/A;  . CARDIOVERSION N/A 08/20/2015   Procedure: CARDIOVERSION;  Surgeon: [name], MD;  Location: [place] ENDOSCOPY;  Service: Cardiovascular;  Laterality: N/A;  . CARDIOVERSION N/A 06/20/2016   Procedure: CARDIOVERSION;  Surgeon: [name], MD;  Location: [place];  Service: Cardiovascular;  Laterality: N/A;  . CARDIOVERSION N/A 08/02/2018   Procedure: CARDIOVERSION;  Surgeon: [name], MD;  Location: [facility] ENDOSCOPY;  Service: Cardiovascular;  Laterality: N/A;  . CARDIOVERSION N/A 02/25/2019   Procedure: CARDIOVERSION;  Surgeon: Lelon Perla, MD;  Location: [facility] ENDOSCOPY;  Service: Cardiovascular;  Laterality: N/A;  . CHOLECYSTECTOMY    . COLONOSCOPY N/A 02/06/2017   Procedure: COLONOSCOPY;  Surgeon: [name], MD;  Location: [facility] ENDOSCOPY;  Service: Endoscopy;  Laterality: N/A;  . ELECTROPHYSIOLOGIC STUDY N/A 06/06/2016   Procedure: Atrial Fibrillation Ablation;  Surgeon: Will Meredith Leeds, MD;  Location: [place] CV LAB;  Service: Cardiovascular;  Laterality: N/A;  . TEE WITHOUT CARDIOVERSION N/A 07/11/2013   Procedure: TRANSESOPHAGEAL ECHOCARDIOGRAM (TEE);  Surgeon: [name], MD;  Location: [facility] ENDOSCOPY;  Service: Cardiovascular;  Laterality: N/A;  . TONSILLECTOMY    . VASECTOMY      Current Outpatient Medications  Medication Sig Dispense Refill  . amLODipine-benazepril (LOTREL) 10-40 MG capsule Take 1 capsule by mouth daily.    . carvedilol (COREG) 6.25 MG tablet Take 1 tablet (6.25 mg  total) by mouth 2 (two) times daily. 180 tablet 2  . propafenone (RYTHMOL SR) 225 MG 12 hr capsule Take 1 capsule (225 mg total) by mouth 2 (two) times daily. 60 capsule 6  . rivaroxaban (XARELTO) 20 MG TABS tablet at bedtime.      No current facility-administered medications for this encounter.     Allergies  Allergen Reactions  . Poison Ivy Extract Itching     Social History   Socioeconomic History  . Marital status: Married    Spouse name: Not on file  . Number of children: Not on file  . Years of education: Not on file  . Highest education level: Not on file  Occupational History  . Not on file  Social Needs  . Financial resource strain: Not on file  . Food insecurity    Worry: Not on file    Inability: Not on file  . Transportation needs    Medical: Not on file    Non-medical: Not on file  Tobacco Use  . Smoking status: Never Smoker  . Smokeless tobacco: Never Used  Substance and Sexual Activity  . Alcohol use: Yes    Alcohol/week: 2.0 standard drinks    Types: 2 Glasses of wine per week  . Drug use: No  . Sexual activity: Not on file  Lifestyle  . Physical activity    Days per week: Not on file    Minutes per session: Not on file  . Stress: Not on file  Relationships  . Social Herbalist on phone: Not on file    Gets together: Not on file    Attends religious service: Not on file    Active member of club or organization: Not on file    Attends meetings of clubs or organizations: Not on file    Relationship status: Not on file  . Intimate partner violence    Fear of current or ex partner: Not on file    Emotionally abused: Not on file    Physically abused: Not on file    Forced sexual activity: Not on file  Other Topics Concern  . Not on file  Social History Narrative  . Not on file    Family History  Problem Relation Age of Onset  . Uterine cancer Mother   . Prostate cancer Father     ROS- All systems are reviewed and negative except as per the HPI above  Physical Exam: Vitals:   05/17/19 1528  BP: (!) 150/90  Pulse: (!) 101  Weight: 110.8 kg  Height: 6\' 3"  (1.905 m)   Wt Readings from Last 3 Encounters:  05/17/19 110.8 kg  04/21/19 113.4 kg  04/04/19 110 kg    Labs: Lab Results  Component Value Date   NA 139 02/25/2019   K 4.2 02/25/2019   CL 101 02/25/2019   CO2 24 07/28/2018    GLUCOSE 89 02/25/2019   BUN 21 02/25/2019   CREATININE 1.10 02/25/2019   CALCIUM 9.0 07/28/2018   MG 2.2 07/08/2013   No results found for: INR Lab Results  Component Value Date   CHOL 177 05/27/2016   HDL 53 05/27/2016   LDLCALC 103 (H) 05/27/2016   TRIG 106 05/27/2016     GEN- The patient is well appearing, alert and oriented x 3 today.   Head- normocephalic, atraumatic Eyes-  Sclera clear, conjunctiva pink Ears- hearing intact Oropharynx- clear Neck- supple, no JVP Lymph- no cervical lymphadenopathy Lungs- Clear to ausculation  bilaterally, normal work of breathing Heart- Regular rate and rhythm, no murmurs, rubs or gallops, PMI not laterally displaced GI- soft, NT, ND, + BS Extremities- no clubbing, cyanosis, or edema MS- no significant deformity or atrophy Skin- no rash or lesion Psych- euthymic mood, full affect Neuro- strength and sensation are intact  EKG-afib at 101 bpm, qrs int 110 ms, qrs int 464 ms Epic records reviewed   Assessment and Plan: 1. Persistent afib  Has been back in afib since 11/11 Discussed with Dr. Curt Bears  Will increase Rythmol SR  to 325 mg  bid Will set up for cardioversion He restared xarelto 20 mg  on the day he went into afib 11/11 so Dr. Macky Lower does not feel he will need a TEE guided DCCV as pt is very reliable knowing when he is in afib Will need EKG after cardioversion in SR with increase of propafenone 325 mg bid   Cbc/bmet/covid test   2. CHA2DS2VASc score of 1 Continue  xarelto 20 mg daily  F/u after cardioversion with Dr. Gaynelle Cage C. , [facility] [address] [place], [place] [address] [phone]

## 2019-05-17 NOTE — H&P (View-Only) (Signed)
Primary Care Physician: [name], MD Referring Physician: Dr. Reggy Eye Adrian Clark is a 63 y.o. male with a h/o  PAF s/pstatus post ablation in 2013 [facility] in [place], [place]), OSA, HTN. Admitted in 1/15 with AF with RVR. He had LA thrombus on TEE. He eventually converted to NSR on his own. Patient was back in atrial fibrillation 1/2017CHADS2-VASc=1 (HTN). He was placed on Xarelto 20 mg QD with plans to proceed with DCCV after 4 weeks of uninterrupted anticoagulation. He underwent DCCV 08/20/15. Had repeat ablation on 06/06/16. 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 Since that time, was started on and has stopped flecainide.   He had a successful cardioversion in August of 2020. He was placed on Rythmol in October by Dr. Curt Bears. He now is being seen in the afib clinic since starting back in afib on 11/11. He was off xarelto but did start back on 11/11 after returning to afib that day.  He is very reliable to know when he is in afib and shows me on his watch when his HR's escalated on that day.   I discussed with Dr. Curt Bears and he feels comfortable with pt going for cardioversion without TEE since he went back on xarelto the day he noted afib. He recommends increasing  Rythmol to 325 mg bid and plan on cardioversion. If this is not successful he discussed tikoyn or he would  try repeat ablation.   Today, he denies symptoms of  chest pain, shortness of breath, orthopnea, PND, lower extremity edema, dizziness, presyncope, syncope, or neurologic sequela. The patient is tolerating medications without difficulties and is otherwise without complaint today.   Past Medical History:  Diagnosis Date  . HTN (hypertension)   . OSA (obstructive sleep apnea)   . PAF (paroxysmal atrial fibrillation) ([place]) 2013   s/p ablation    Past Surgical History:  Procedure Laterality Date  . ABLATION     AFIB  . CARDIOVERSION N/A 07/11/2013   Procedure:  CARDIOVERSION;  Surgeon: [name], MD;  Location: [place];  Service: Cardiovascular;  Laterality: N/A;  . CARDIOVERSION N/A 08/20/2015   Procedure: CARDIOVERSION;  Surgeon: [name], MD;  Location: [place] ENDOSCOPY;  Service: Cardiovascular;  Laterality: N/A;  . CARDIOVERSION N/A 06/20/2016   Procedure: CARDIOVERSION;  Surgeon: [name], MD;  Location: [place];  Service: Cardiovascular;  Laterality: N/A;  . CARDIOVERSION N/A 08/02/2018   Procedure: CARDIOVERSION;  Surgeon: [name], MD;  Location: [facility] ENDOSCOPY;  Service: Cardiovascular;  Laterality: N/A;  . CARDIOVERSION N/A 02/25/2019   Procedure: CARDIOVERSION;  Surgeon: Lelon Perla, MD;  Location: [facility] ENDOSCOPY;  Service: Cardiovascular;  Laterality: N/A;  . CHOLECYSTECTOMY    . COLONOSCOPY N/A 02/06/2017   Procedure: COLONOSCOPY;  Surgeon: [name], MD;  Location: [facility] ENDOSCOPY;  Service: Endoscopy;  Laterality: N/A;  . ELECTROPHYSIOLOGIC STUDY N/A 06/06/2016   Procedure: Atrial Fibrillation Ablation;  Surgeon: Will Meredith Leeds, MD;  Location: [place] CV LAB;  Service: Cardiovascular;  Laterality: N/A;  . TEE WITHOUT CARDIOVERSION N/A 07/11/2013   Procedure: TRANSESOPHAGEAL ECHOCARDIOGRAM (TEE);  Surgeon: [name], MD;  Location: [facility] ENDOSCOPY;  Service: Cardiovascular;  Laterality: N/A;  . TONSILLECTOMY    . VASECTOMY      Current Outpatient Medications  Medication Sig Dispense Refill  . amLODipine-benazepril (LOTREL) 10-40 MG capsule Take 1 capsule by mouth daily.    . carvedilol (COREG) 6.25 MG tablet Take 1 tablet (6.25 mg  total) by mouth 2 (two) times daily. 180 tablet 2  . propafenone (RYTHMOL SR) 225 MG 12 hr capsule Take 1 capsule (225 mg total) by mouth 2 (two) times daily. 60 capsule 6  . rivaroxaban (XARELTO) 20 MG TABS tablet at bedtime.      No current facility-administered medications for this encounter.     Allergies  Allergen Reactions  . Poison Ivy Extract Itching     Social History   Socioeconomic History  . Marital status: Married    Spouse name: Not on file  . Number of children: Not on file  . Years of education: Not on file  . Highest education level: Not on file  Occupational History  . Not on file  Social Needs  . Financial resource strain: Not on file  . Food insecurity    Worry: Not on file    Inability: Not on file  . Transportation needs    Medical: Not on file    Non-medical: Not on file  Tobacco Use  . Smoking status: Never Smoker  . Smokeless tobacco: Never Used  Substance and Sexual Activity  . Alcohol use: Yes    Alcohol/week: 2.0 standard drinks    Types: 2 Glasses of wine per week  . Drug use: No  . Sexual activity: Not on file  Lifestyle  . Physical activity    Days per week: Not on file    Minutes per session: Not on file  . Stress: Not on file  Relationships  . Social Herbalist on phone: Not on file    Gets together: Not on file    Attends religious service: Not on file    Active member of club or organization: Not on file    Attends meetings of clubs or organizations: Not on file    Relationship status: Not on file  . Intimate partner violence    Fear of current or ex partner: Not on file    Emotionally abused: Not on file    Physically abused: Not on file    Forced sexual activity: Not on file  Other Topics Concern  . Not on file  Social History Narrative  . Not on file    Family History  Problem Relation Age of Onset  . Uterine cancer Mother   . Prostate cancer Father     ROS- All systems are reviewed and negative except as per the HPI above  Physical Exam: Vitals:   05/17/19 1528  BP: (!) 150/90  Pulse: (!) 101  Weight: 110.8 kg  Height: 6\' 3"  (1.905 m)   Wt Readings from Last 3 Encounters:  05/17/19 110.8 kg  04/21/19 113.4 kg  04/04/19 110 kg    Labs: Lab Results  Component Value Date   NA 139 02/25/2019   K 4.2 02/25/2019   CL 101 02/25/2019   CO2 24 07/28/2018    GLUCOSE 89 02/25/2019   BUN 21 02/25/2019   CREATININE 1.10 02/25/2019   CALCIUM 9.0 07/28/2018   MG 2.2 07/08/2013   No results found for: INR Lab Results  Component Value Date   CHOL 177 05/27/2016   HDL 53 05/27/2016   LDLCALC 103 (H) 05/27/2016   TRIG 106 05/27/2016     GEN- The patient is well appearing, alert and oriented x 3 today.   Head- normocephalic, atraumatic Eyes-  Sclera clear, conjunctiva pink Ears- hearing intact Oropharynx- clear Neck- supple, no JVP Lymph- no cervical lymphadenopathy Lungs- Clear to ausculation  bilaterally, normal work of breathing Heart- Regular rate and rhythm, no murmurs, rubs or gallops, PMI not laterally displaced GI- soft, NT, ND, + BS Extremities- no clubbing, cyanosis, or edema MS- no significant deformity or atrophy Skin- no rash or lesion Psych- euthymic mood, full affect Neuro- strength and sensation are intact  EKG-afib at 101 bpm, qrs int 110 ms, qrs int 464 ms Epic records reviewed   Assessment and Plan: 1. Persistent afib  Has been back in afib since 11/11 Discussed with Dr. Curt Bears  Will increase Rythmol SR  to 325 mg  bid Will set up for cardioversion He restared xarelto 20 mg  on the day he went into afib 11/11 so Dr. Macky Lower does not feel he will need a TEE guided DCCV as pt is very reliable knowing when he is in afib Will need EKG after cardioversion in SR with increase of propafenone 325 mg bid   Cbc/bmet/covid test   2. CHA2DS2VASc score of 1 Continue  xarelto 20 mg daily  F/u after cardioversion with Dr. Gaynelle Cage C. Arayla Kruschke, [facility] [address] [place], [place] [address] [phone]

## 2019-05-19 ENCOUNTER — Inpatient Hospital Stay (HOSPITAL_COMMUNITY): Admission: RE | Admit: 2019-05-19 | Payer: BC Managed Care – PPO | Source: Ambulatory Visit

## 2019-05-20 ENCOUNTER — Other Ambulatory Visit (HOSPITAL_COMMUNITY)
Admission: RE | Admit: 2019-05-20 | Discharge: 2019-05-20 | Disposition: A | Discharge status: Home / Self Care | Payer: BC Managed Care – PPO | Source: Ambulatory Visit | Attending: Cardiovascular Disease | Admitting: Cardiovascular Disease

## 2019-05-20 DIAGNOSIS — Z20828 Contact with and (suspected) exposure to other viral communicable diseases: Secondary | ICD-10-CM | POA: Diagnosis not present

## 2019-05-20 DIAGNOSIS — Z01812 Encounter for preprocedural laboratory examination: Secondary | ICD-10-CM | POA: Diagnosis not present

## 2019-05-20 DIAGNOSIS — G4733 Obstructive sleep apnea (adult) (pediatric): Secondary | ICD-10-CM | POA: Diagnosis not present

## 2019-05-20 LAB — SARS CORONAVIRUS 2 (TAT 6-24 HRS): SARS Coronavirus 2: NEGATIVE

## 2019-05-23 ENCOUNTER — Other Ambulatory Visit: Payer: Self-pay

## 2019-05-23 ENCOUNTER — Ambulatory Visit (HOSPITAL_COMMUNITY)
Admission: RE | Admit: 2019-05-23 | Discharge: 2019-05-23 | Disposition: A | Discharge status: Home / Self Care | Payer: BC Managed Care – PPO | Attending: Cardiovascular Disease | Admitting: Cardiovascular Disease

## 2019-05-23 ENCOUNTER — Encounter (HOSPITAL_COMMUNITY)
Admission: RE | Disposition: A | Discharge status: Home / Self Care | Payer: Self-pay | Source: Home / Self Care | Attending: Cardiovascular Disease

## 2019-05-23 ENCOUNTER — Ambulatory Visit (HOSPITAL_COMMUNITY): Payer: BC Managed Care – PPO | Admitting: Certified Registered Nurse Anesthetist

## 2019-05-23 ENCOUNTER — Encounter (HOSPITAL_COMMUNITY): Payer: Self-pay

## 2019-05-23 DIAGNOSIS — Z79899 Other long term (current) drug therapy: Secondary | ICD-10-CM | POA: Diagnosis not present

## 2019-05-23 DIAGNOSIS — Z7901 Long term (current) use of anticoagulants: Secondary | ICD-10-CM | POA: Diagnosis not present

## 2019-05-23 DIAGNOSIS — I252 Old myocardial infarction: Secondary | ICD-10-CM | POA: Diagnosis not present

## 2019-05-23 DIAGNOSIS — I10 Essential (primary) hypertension: Secondary | ICD-10-CM | POA: Diagnosis not present

## 2019-05-23 DIAGNOSIS — I4819 Other persistent atrial fibrillation: Secondary | ICD-10-CM | POA: Diagnosis not present

## 2019-05-23 DIAGNOSIS — G4733 Obstructive sleep apnea (adult) (pediatric): Secondary | ICD-10-CM | POA: Diagnosis not present

## 2019-05-23 DIAGNOSIS — D759 Disease of blood and blood-forming organs, unspecified: Secondary | ICD-10-CM | POA: Diagnosis not present

## 2019-05-23 HISTORY — PX: CARDIOVERSION: SHX1299

## 2019-05-23 SURGERY — CARDIOVERSION
Anesthesia: General

## 2019-05-23 MED ORDER — SODIUM CHLORIDE 0.9 % IV SOLN
Freq: Once | INTRAVENOUS | Status: AC
  Administered 2019-05-23: 08:00:00 via INTRAVENOUS

## 2019-05-23 MED ORDER — CARVEDILOL 6.25 MG PO TABS: 6.2500 mg | ORAL_TABLET | Freq: Two times a day (BID) | ORAL | 3 refills | Status: DC

## 2019-05-23 MED ORDER — SODIUM CHLORIDE 0.9 % IV SOLN
INTRAVENOUS | Status: DC | PRN
  Administered 2019-05-23: 09:00:00 via INTRAVENOUS

## 2019-05-23 MED ORDER — PROPOFOL 10 MG/ML IV BOLUS
INTRAVENOUS | Status: DC | PRN
  Administered 2019-05-23: 100 mg via INTRAVENOUS

## 2019-05-23 MED ORDER — LIDOCAINE 2% (20 MG/ML) 5 ML SYRINGE
INTRAMUSCULAR | Status: DC | PRN
  Administered 2019-05-23: 80 mg via INTRAVENOUS

## 2019-05-23 NOTE — Anesthesia Postprocedure Evaluation (Signed)
Anesthesia Post Note  Patient: Adrian Clark  Procedure(s) Performed: CARDIOVERSION (N/A )     Patient location during evaluation: Endoscopy Anesthesia Type: General Level of consciousness: awake and alert Pain management: pain level controlled Vital Signs Assessment: post-procedure vital signs reviewed and stable Respiratory status: spontaneous breathing, nonlabored ventilation, respiratory function stable and patient connected to nasal cannula oxygen Cardiovascular status: blood pressure returned to baseline and stable Postop Assessment: no apparent nausea or vomiting Anesthetic complications: no    Last Vitals:  Vitals:   05/23/19 0920 05/23/19 0930  BP: 120/83 134/86  Pulse: (!) 58 (!) 58  Resp: (!) 21 (!) 21  Temp:    SpO2: 96% 99%    Last Pain:  Vitals:   05/23/19 0930  TempSrc:   PainSc: 0-No pain                 Andras Grunewald L Zona Pedro

## 2019-05-23 NOTE — Transfer of Care (Signed)
Immediate Anesthesia Transfer of Care Note  Patient: Adrian Clark  Procedure(s) Performed: CARDIOVERSION (N/A )  Patient Location: Endoscopy Unit  Anesthesia Type:General  Level of Consciousness: awake  Airway & Oxygen Therapy: Patient Spontanous Breathing and Patient connected to nasal cannula oxygen  Post-op Assessment: Report given to RN and Post -op Vital signs reviewed and stable  Post vital signs: Reviewed  Last Vitals:  Vitals Value Taken Time  BP    Temp    Pulse    Resp    SpO2      Last Pain:  Vitals:   05/23/19 0751  TempSrc: Oral  PainSc: 0-No pain         Complications: No apparent anesthesia complications

## 2019-05-23 NOTE — Anesthesia Procedure Notes (Signed)
Date/Time: 05/23/2019 8:59 AM Performed by: Janene Harvey, CRNA Pre-anesthesia Checklist: Patient identified, Emergency Drugs available, Suction available and Patient being monitored Oxygen Delivery Method: Ambu bag Dental Injury: Teeth and Oropharynx as per pre-operative assessment

## 2019-05-23 NOTE — Interval H&P Note (Signed)
History and Physical Interval Note:  05/23/2019 8:45 AM  [name]  has presented today for surgery, with the diagnosis of AFIB.  The various methods of treatment have been discussed with the patient and family. After consideration of risks, benefits and other options for treatment, the patient has consented to  Procedure(s): CARDIOVERSION (N/A) as a surgical intervention.  The patient's history has been reviewed, patient examined, no change in status, stable for surgery.  I have reviewed the patient's chart and labs.  Questions were answered to the patient's satisfaction.     Adrian Clark

## 2019-05-23 NOTE — Discharge Instructions (Signed)

## 2019-05-23 NOTE — Anesthesia Preprocedure Evaluation (Addendum)
Anesthesia Evaluation  Patient identified by MRN, date of birth, ID band Patient awake    Reviewed: Allergy & Precautions, NPO status , Patient's Chart, lab work & pertinent test results, reviewed documented beta blocker date and time   Airway Mallampati: II  TM Distance: >3 FB Neck ROM: Full    Dental no notable dental hx. (+) Teeth Intact, Dental Advisory Given   Pulmonary sleep apnea ,    Pulmonary exam normal breath sounds clear to auscultation       Cardiovascular hypertension, Pt. on home beta blockers and Pt. on medications Normal cardiovascular exam+ dysrhythmias (s/p ablation 2013, multiple CV) Atrial Fibrillation  Rhythm:Irregular Rate:Normal  TTE 2015 - Left ventricle: Systolic function was normal. The  estimated ejection fraction was in the range of 55% to  60%. Wall motion was normal; there were no regional wall motion abnormalities.  - Aortic valve: Trivial regurgitation.  - Mitral valve: Mild regurgitation.  - Left atrium: The atrium was moderately dilated. No  evidence of thrombus in the atrial cavity. There was a  thrombus. Cannot exclude thrombus in the appendage. There  was spontaneous echo contrast ("smoke").  - Right atrium: No evidence of thrombus in the atrial cavity  or appendage.   Stress Test 2017 Blood pressure demonstrated a normal response to exercise. .There was no ST segment deviation noted during stress. No T wave inversion was noted during stress. Heart rate response inadequate for diagnosis.   Neuro/Psych negative neurological ROS  negative psych ROS   GI/Hepatic negative GI ROS, Neg liver ROS,   Endo/Other  negative endocrine ROS  Renal/GU negative Renal ROS  negative genitourinary   Musculoskeletal negative musculoskeletal ROS (+)   Abdominal   Peds  Hematology  (+) Blood dyscrasia (on xarelto), ,   Anesthesia Other Findings   Reproductive/Obstetrics                             Anesthesia Physical Anesthesia Plan  ASA: III  Anesthesia Plan: General   Post-op Pain Management:    Induction: Intravenous  PONV Risk Score and Plan: 2 and Propofol infusion and Treatment may vary due to age or medical condition  Airway Management Planned: Natural Airway  Additional Equipment:   Intra-op Plan:   Post-operative Plan:   Informed Consent: I have reviewed the patients History and Physical, chart, labs and discussed the procedure including the risks, benefits and alternatives for the proposed anesthesia with the patient or authorized representative who has indicated his/her understanding and acceptance.     Dental advisory given  Plan Discussed with: CRNA  Anesthesia Plan Comments:         Anesthesia Quick Evaluation

## 2019-05-23 NOTE — CV Procedure (Signed)
    Cardioversion Note  Adrian Clark [id number] 12-15-1955  Procedure: DC Cardioversion Indications: atrial fib   Procedure Details Consent: Obtained Time Out: Verified patient identification, verified procedure, site/side was marked, verified correct patient position, special equipment/implants available, Radiology Safety Procedures followed,  medications/allergies/relevent history reviewed, required imaging and test results available.  Performed  The patient has been on adequate anticoagulation.  The patient received IV Lidocaine 80 mg followed by Propofol 100 mg  for sedation.  Synchronous cardioversion was performed at 200  joules.  The cardioversion was successful     Complications: No apparent complications Patient did tolerate procedure well.   Thayer Headings, Brooke Bonito., MD, [facility] 05/23/2019, 9:02 AM

## 2019-05-31 ENCOUNTER — Other Ambulatory Visit: Payer: Self-pay

## 2019-05-31 ENCOUNTER — Encounter: Payer: Self-pay | Admitting: Cardiology

## 2019-05-31 ENCOUNTER — Ambulatory Visit (INDEPENDENT_AMBULATORY_CARE_PROVIDER_SITE_OTHER): Payer: BC Managed Care – PPO | Admitting: Cardiology

## 2019-05-31 VITALS — BP 134/90 | HR 58 | Ht 75.0 in | Wt 244.0 lb

## 2019-05-31 DIAGNOSIS — I4819 Other persistent atrial fibrillation: Secondary | ICD-10-CM | POA: Diagnosis not present

## 2019-05-31 NOTE — Progress Notes (Signed)
Electrophysiology Office Note   Date:  05/31/2019   ID:  Adrian Clark, DOB 1955-10-08, MRN [id number]  PCP:  [name], MD  Cardiologist:  [name] Primary Electrophysiologist:  Terecia Plaut Meredith Leeds, MD    No chief complaint on file.    History of Present Illness: Adrian Clark is a 62 y.o. male who presents today for electrophysiology evaluation.   History of PAF s/pstatus post ablation in 2013 [facility] in [place], [place]), OSA, HTN. Admitted in 1/15 with AF with RVR. He had LA thrombus on TEE. He eventually converted to NSR on his own.  Patient was back in atrial fibrillation 07/2015 CHADS2-VASc=1 (HTN). He was placed on Xarelto 20 mg QD with plans to proceed with DCCV after 4 weeks of uninterrupted anticoagulation. He underwent DCCV 08/20/15. Had repeat ablation on 06/06/16. 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   Today, denies symptoms of palpitations, chest pain, shortness of breath, orthopnea, PND, lower extremity edema, claudication, dizziness, presyncope, syncope, bleeding, or neurologic sequela. The patient is tolerating medications without difficulties.  He currently feels well.  Unfortunately he went into atrial fibrillation a few weeks ago.  He was seen in A. fib clinic and his propafenone was increased, and he had a cardioversion 05/23/2019.  He is remained in sinus rhythm since that time.  Past Medical History:  Diagnosis Date  . HTN (hypertension)   . OSA (obstructive sleep apnea)   . PAF (paroxysmal atrial fibrillation) ([place]) 2013   s/p ablation    Past Surgical History:  Procedure Laterality Date  . ABLATION     AFIB  . CARDIOVERSION N/A 07/11/2013   Procedure: CARDIOVERSION;  Surgeon: [name], MD;  Location: [place];  Service: Cardiovascular;  Laterality: N/A;  . CARDIOVERSION N/A 08/20/2015   Procedure: CARDIOVERSION;  Surgeon: [name], MD;  Location: [place] ENDOSCOPY;  Service: Cardiovascular;   Laterality: N/A;  . CARDIOVERSION N/A 06/20/2016   Procedure: CARDIOVERSION;  Surgeon: [name], MD;  Location: [place];  Service: Cardiovascular;  Laterality: N/A;  . CARDIOVERSION N/A 08/02/2018   Procedure: CARDIOVERSION;  Surgeon: [name], MD;  Location: [facility] ENDOSCOPY;  Service: Cardiovascular;  Laterality: N/A;  . CARDIOVERSION N/A 02/25/2019   Procedure: CARDIOVERSION;  Surgeon: Lelon Perla, MD;  Location: [facility] ENDOSCOPY;  Service: Cardiovascular;  Laterality: N/A;  . CARDIOVERSION N/A 05/23/2019   Procedure: CARDIOVERSION;  Surgeon: Acie Fredrickson Wonda Cheng, MD;  Location: [place];  Service: Cardiovascular;  Laterality: N/A;  . CHOLECYSTECTOMY    . COLONOSCOPY N/A 02/06/2017   Procedure: COLONOSCOPY;  Surgeon: [name], MD;  Location: [facility] ENDOSCOPY;  Service: Endoscopy;  Laterality: N/A;  . ELECTROPHYSIOLOGIC STUDY N/A 06/06/2016   Procedure: Atrial Fibrillation Ablation;  Surgeon: Gio Janoski Meredith Leeds, MD;  Location: [place] CV LAB;  Service: Cardiovascular;  Laterality: N/A;  . TEE WITHOUT CARDIOVERSION N/A 07/11/2013   Procedure: TRANSESOPHAGEAL ECHOCARDIOGRAM (TEE);  Surgeon: [name], MD;  Location: [facility] ENDOSCOPY;  Service: Cardiovascular;  Laterality: N/A;  . TONSILLECTOMY    . VASECTOMY       Current Outpatient Medications  Medication Sig Dispense Refill  . amLODipine-benazepril (LOTREL) 10-40 MG capsule Take 1 capsule by mouth daily.    . carvedilol (COREG) 6.25 MG tablet Take 1 tablet (6.25 mg total) by mouth 2 (two) times daily with a meal. 180 tablet 3  . propafenone (RYTHMOL SR) 325 MG 12 hr capsule Take 1 capsule (325 mg total) by mouth 2 (two)  times daily. 60 capsule 6  . rivaroxaban (XARELTO) 20 MG TABS tablet Take 20 mg by mouth at bedtime.      No current facility-administered medications for this visit.     Allergies:   Poison ivy extract   Social History:  The patient  reports that he has never smoked. He has never used smokeless  tobacco. He reports current alcohol use of about 2.0 standard drinks of alcohol per week. He reports that he does not use drugs.   Family History:  The patient's family history includes Prostate cancer in his father; Uterine cancer in his mother.   ROS:  Please see the history of present illness.   Otherwise, review of systems is positive for none.   All other systems are reviewed and negative.   PHYSICAL EXAM: VS:  BP 134/90   Pulse (!) 58   Ht 6\' 3"  (1.905 m)   Wt 244 lb (110.7 kg)   SpO2 97%   BMI 30.50 kg/m  , BMI Body mass index is 30.5 kg/m. GEN: Well nourished, well developed, in no acute distress  HEENT: normal  Neck: no JVD, carotid bruits, or masses Cardiac: RRR; no murmurs, rubs, or gallops,no edema  Respiratory:  clear to auscultation bilaterally, normal work of breathing GI: soft, nontender, nondistended, + BS MS: no deformity or atrophy  Skin: warm and dry Neuro:  Strength and sensation are intact Psych: euthymic mood, full affect  EKG:  EKG is ordered today. Personal review of the ekg ordered shows sinus rhythm, rate 58  Recent Labs: 05/17/2019: BUN 21; Creatinine, Ser 1.01; Hemoglobin 15.1; Platelets 321; Potassium 4.1; Sodium 136    Lipid Panel     Component Value Date/Time   CHOL 177 05/27/2016 0844   TRIG 106 05/27/2016 0844   HDL 53 05/27/2016 0844   CHOLHDL 3.3 05/27/2016 0844   VLDL 21 05/27/2016 0844   LDLCALC 103 (H) 05/27/2016 0844     Wt Readings from Last 3 Encounters:  05/31/19 244 lb (110.7 kg)  05/17/19 244 lb 4.8 oz (110.8 kg)  04/21/19 250 lb (113.4 kg)      Other studies Reviewed: Additional studies/ records that were reviewed today include: TEE 07/11/13  Review of the above records today demonstrates:  - Left ventricle: Systolic function was normal. The estimated ejection fraction was in the range of 55% to 60%. Wall motion was normal; there were no regional wall motion abnormalities. - Aortic valve: Trivial  regurgitation. - Mitral valve: Mild regurgitation. - Left atrium: The atrium was moderately dilated. No evidence of thrombus in the atrial cavity. There was a thrombus. Cannot exclude thrombus in the appendage. There was spontaneous echo contrast ("smoke"). - Right atrium: No evidence of thrombus in the atrial cavity or appendage.   ASSESSMENT AND PLAN:  1.  Persistent atrial fibrillation: Post ablation 06/06/2016 though unable to isolate the right upper pulmonary vein.  Has had multiple further episodes of atrial fibrillation.  Is on low-dose propafenone.  Afternoon was increased at his AF clinic appointment.  He is now status post cardioversion and remains in sinus rhythm.  Should he go back into atrial fibrillation, Lauren Modisette potentially plan for dofetilide versus repeat ablation.  This patients CHA2DS2-VASc Score and unadjusted Ischemic Stroke Rate (% per year) is equal to 0.6 % stroke rate/year from a score of 1  Above score calculated as 1 point each if present [CHF, HTN, DM, Vascular=MI/PAD/Aortic Plaque, Age if 65-74, or Male] Above score calculated as 2 points each  if present [Age > 75, or Stroke/TIA/TE]  2. OSA: CPAP compliance encouraged  3. Hypertension: Mildly elevated today but usually well controlled   Current medicines are reviewed at length with the patient today.   The patient does not have concerns regarding his medicines.  The following changes were made today: None  Labs/ tests ordered today include:  No orders of the defined types were placed in this encounter.    Disposition:   FU with Roxann Vierra 6 months  Signed, Orella Cushman Meredith Leeds, MD  05/31/2019 8:53 AM     [facility] [address] [place] [address] [phone] (office) [phone] (fax)

## 2019-05-31 NOTE — Patient Instructions (Addendum)

## 2019-06-15 MED ORDER — PROPAFENONE HCL ER 325 MG PO CP12: 325.0000 mg | ORAL_CAPSULE | Freq: Two times a day (BID) | ORAL | 1 refills | Status: DC

## 2019-07-28 ENCOUNTER — Other Ambulatory Visit: Payer: Self-pay | Admitting: Cardiology

## 2019-07-28 NOTE — Telephone Encounter (Signed)
Pt last saw Dr Curt Bears 05/31/19, last labs 05/17/19 Creat 1.01, age 64, weight 110.7kg, CrCl 115.69, based on CrCl pt is on appropriate dosage of Xarelto 20mg  QD.  Will refill rx.

## 2019-08-02 DIAGNOSIS — D1801 Hemangioma of skin and subcutaneous tissue: Secondary | ICD-10-CM | POA: Diagnosis not present

## 2019-08-02 DIAGNOSIS — D235 Other benign neoplasm of skin of trunk: Secondary | ICD-10-CM | POA: Diagnosis not present

## 2019-08-02 DIAGNOSIS — D2272 Melanocytic nevi of left lower limb, including hip: Secondary | ICD-10-CM | POA: Diagnosis not present

## 2019-08-02 DIAGNOSIS — D229 Melanocytic nevi, unspecified: Secondary | ICD-10-CM | POA: Diagnosis not present

## 2019-08-02 DIAGNOSIS — B078 Other viral warts: Secondary | ICD-10-CM | POA: Diagnosis not present

## 2019-08-02 DIAGNOSIS — D2372 Other benign neoplasm of skin of left lower limb, including hip: Secondary | ICD-10-CM | POA: Diagnosis not present

## 2019-08-02 DIAGNOSIS — D492 Neoplasm of unspecified behavior of bone, soft tissue, and skin: Secondary | ICD-10-CM | POA: Diagnosis not present

## 2019-08-02 DIAGNOSIS — D225 Melanocytic nevi of trunk: Secondary | ICD-10-CM | POA: Diagnosis not present

## 2019-08-02 DIAGNOSIS — L821 Other seborrheic keratosis: Secondary | ICD-10-CM | POA: Diagnosis not present

## 2019-08-02 NOTE — Telephone Encounter (Signed)
Pt scheduled for virtual visit (per Dr. Curt Bears) to discuss further. Patient verbalized understanding and agreeable to plan.

## 2019-08-08 ENCOUNTER — Telehealth (INDEPENDENT_AMBULATORY_CARE_PROVIDER_SITE_OTHER): Payer: BC Managed Care – PPO | Admitting: Cardiology

## 2019-08-08 ENCOUNTER — Other Ambulatory Visit: Payer: Self-pay

## 2019-08-08 DIAGNOSIS — I480 Paroxysmal atrial fibrillation: Secondary | ICD-10-CM | POA: Diagnosis not present

## 2019-08-08 MED ORDER — PROPAFENONE HCL ER 425 MG PO CP12: 425.0000 mg | ORAL_CAPSULE | Freq: Two times a day (BID) | ORAL | 3 refills | Status: DC

## 2019-08-08 NOTE — Progress Notes (Signed)
Electrophysiology TeleHealth Note   Due to national recommendations of social distancing due to COVID 19, an audio/video telehealth visit is felt to be most appropriate for this patient at this time.  See Epic message for the patient's consent to telehealth for [facility].   Date:  08/08/2019   ID:  Adrian Clark, DOB Dec 27, 1955, MRN [id number]  Location: patient's home  Provider location: [address], [place]  Evaluation Performed: Follow-up visit  PCP:  Corrington, Kip A, MD  Cardiologist:  No primary care provider on file.  Electrophysiologist:  Dr Curt Bears  Chief Complaint:  AF  History of Present Illness:    Adrian Clark is a 64 y.o. male who presents via audio/video conferencing for a telehealth visit today.  Since last being seen in our clinic, the patient reports doing very well.  Today, he denies symptoms of palpitations, chest pain, shortness of breath,  lower extremity edema, dizziness, presyncope, or syncope.  The patient is otherwise without complaint today.  The patient denies symptoms of fevers, chills, cough, or new SOB worrisome for COVID 19.  He has a history of atrial fibrillation and hypertension.  He went into atrial fibrillation approximately 1 week ago.  Fortunately converted to sinus rhythm on Saturday.  He has continued to have episodes despite a second ablation.  Unfortunately his right pulmonary veins could not be isolated.  He is currently on propafenone.  He has had multiple cardioversions due to his atrial fibrillation.  Today, denies symptoms of palpitations, chest pain, shortness of breath, orthopnea, PND, lower extremity edema, claudication, dizziness, presyncope, syncope, bleeding, or neurologic sequela. The patient is tolerating medications without difficulties.  Since converting to sinus rhythm he has felt well without complaint.  Past Medical History:  Diagnosis Date  . HTN (hypertension)   . OSA (obstructive sleep apnea)     . PAF (paroxysmal atrial fibrillation) ([place]) 2013   s/p ablation     Past Surgical History:  Procedure Laterality Date  . ABLATION     AFIB  . CARDIOVERSION N/A 07/11/2013   Procedure: CARDIOVERSION;  Surgeon: [name], MD;  Location: [facility];  Service: Cardiovascular;  Laterality: N/A;  . CARDIOVERSION N/A 08/20/2015   Procedure: CARDIOVERSION;  Surgeon: [name], MD;  Location: [place] ENDOSCOPY;  Service: Cardiovascular;  Laterality: N/A;  . CARDIOVERSION N/A 06/20/2016   Procedure: CARDIOVERSION;  Surgeon: [name], MD;  Location: [place];  Service: Cardiovascular;  Laterality: N/A;  . CARDIOVERSION N/A 08/02/2018   Procedure: CARDIOVERSION;  Surgeon: [name], MD;  Location: [facility] ENDOSCOPY;  Service: Cardiovascular;  Laterality: N/A;  . CARDIOVERSION N/A 02/25/2019   Procedure: CARDIOVERSION;  Surgeon: Lelon Perla, MD;  Location: [facility] ENDOSCOPY;  Service: Cardiovascular;  Laterality: N/A;  . CARDIOVERSION N/A 05/23/2019   Procedure: CARDIOVERSION;  Surgeon: Acie Fredrickson Wonda Cheng, MD;  Location: [place];  Service: Cardiovascular;  Laterality: N/A;  . CHOLECYSTECTOMY    . COLONOSCOPY N/A 02/06/2017   Procedure: COLONOSCOPY;  Surgeon: [name], MD;  Location: [facility] ENDOSCOPY;  Service: Endoscopy;  Laterality: N/A;  . ELECTROPHYSIOLOGIC STUDY N/A 06/06/2016   Procedure: Atrial Fibrillation Ablation;  Surgeon: Cailean Heacock Meredith Leeds, MD;  Location: [place] CV LAB;  Service: Cardiovascular;  Laterality: N/A;  . TEE WITHOUT CARDIOVERSION N/A 07/11/2013   Procedure: TRANSESOPHAGEAL ECHOCARDIOGRAM (TEE);  Surgeon: [name], MD;  Location: [facility] ENDOSCOPY;  Service: Cardiovascular;  Laterality: N/A;  . TONSILLECTOMY    . VASECTOMY  Current Outpatient Medications  Medication Sig Dispense Refill  . amLODipine-benazepril (LOTREL) 10-40 MG capsule Take 1 capsule by mouth daily.    . carvedilol (COREG) 6.25 MG tablet Take 1 tablet (6.25 mg total) by mouth  2 (two) times daily with a meal. 180 tablet 3  . XARELTO 20 MG TABS tablet TAKE 1 TABLET DAILY WITH SUPPER 90 tablet 1  . propafenone (RYTHMOL SR) 425 MG 12 hr capsule Take 1 capsule (425 mg total) by mouth 2 (two) times daily. 60 capsule 3   No current facility-administered medications for this visit.    Allergies:   Poison ivy extract   Social History:  The patient  reports that he has never smoked. He has never used smokeless tobacco. He reports current alcohol use of about 2.0 standard drinks of alcohol per week. He reports that he does not use drugs.   Family History:  The patient's  family history includes Prostate cancer in his father; Uterine cancer in his mother.   ROS:  Please see the history of present illness.   All other systems are personally reviewed and negative.    Exam:    Vital Signs:  BP 138/89   Pulse 63   Well appearing, alert and conversant, regular work of breathing,  good skin color Eyes- anicteric, neuro- grossly intact, skin- no apparent rash or lesions or cyanosis, mouth- oral mucosa is pink   Labs/Other Tests and Data Reviewed:    Recent Labs: 05/17/2019: BUN 21; Creatinine, Ser 1.01; Hemoglobin 15.1; Platelets 321; Potassium 4.1; Sodium 136   Wt Readings from Last 3 Encounters:  05/31/19 244 lb (110.7 kg)  05/17/19 244 lb 4.8 oz (110.8 kg)  04/21/19 250 lb (113.4 kg)     Other studies personally reviewed: Additional studies/ records that were reviewed today include: ECG 05/31/2019 personally reviewed Review of the above records today demonstrates: Sinus rhythm, first-degree AV block   ASSESSMENT & PLAN:    1.  Paroxysmal atrial fibrillation: Patient is currently on propafenone and Xarelto.  CHA2DS2-VASc of 1.  If he remains in sinus rhythm he may wish to stop his Xarelto.  As he is continuing to have issues with atrial fibrillation, we Adrian Clark increase his propafenone to 425 mg.  I have told him that we can have another attempt at ablation to see  if we can get him to stay in normal rhythm, which may be more effective as procedures have changed somewhat.  He Adrian Clark discuss this with his wife and call us back with an answer.  2.  Hypertension: Blood pressure is mildly elevated today but usually well controlled.  No changes.   COVID 19 screen The patient denies symptoms of COVID 19 at this time.  The importance of social distancing was discussed today.  Follow-up: 3 months  Current medicines are reviewed at length with the patient today.   The patient does not have concerns regarding his medicines.  The following changes were made today:  none  Labs/ tests ordered today include:  No orders of the defined types were placed in this encounter.    Patient Risk:  after full review of this patients clinical status, I feel that they are at moderate risk at this time.  Today, I have spent 9  minutes with the patient with telehealth technology discussing AF .    Signed, Dierdre Mccalip Meredith Leeds, MD  08/08/2019 4:43 PM     [facility] [address] [place] [address] [phone] (office) (  [phone] (fax)

## 2019-08-09 ENCOUNTER — Other Ambulatory Visit: Payer: Self-pay | Admitting: Cardiology

## 2019-09-02 MED ORDER — PROPAFENONE HCL ER 425 MG PO CP12: 425.0000 mg | ORAL_CAPSULE | Freq: Two times a day (BID) | ORAL | 1 refills | Status: DC

## 2019-09-14 DIAGNOSIS — Z23 Encounter for immunization: Secondary | ICD-10-CM | POA: Diagnosis not present

## 2019-10-05 DIAGNOSIS — Z23 Encounter for immunization: Secondary | ICD-10-CM | POA: Diagnosis not present

## 2019-10-21 ENCOUNTER — Other Ambulatory Visit: Payer: Self-pay | Admitting: Cardiology

## 2020-03-15 ENCOUNTER — Other Ambulatory Visit: Payer: Self-pay | Admitting: Cardiology

## 2020-03-15 MED ORDER — PROPAFENONE HCL ER 425 MG PO CP12
425.0000 mg | ORAL_CAPSULE | Freq: Two times a day (BID) | ORAL | 1 refills | Status: DC
Start: 2020-03-15 — End: 2020-06-05

## 2020-04-10 DIAGNOSIS — Z23 Encounter for immunization: Secondary | ICD-10-CM | POA: Diagnosis not present

## 2020-06-05 ENCOUNTER — Encounter: Payer: Self-pay | Admitting: Cardiology

## 2020-06-05 ENCOUNTER — Other Ambulatory Visit: Payer: Self-pay

## 2020-06-05 ENCOUNTER — Ambulatory Visit: Payer: BC Managed Care – PPO | Admitting: Cardiology

## 2020-06-05 VITALS — BP 146/90 | HR 61 | Ht 76.0 in | Wt 261.0 lb

## 2020-06-05 DIAGNOSIS — Z79899 Other long term (current) drug therapy: Secondary | ICD-10-CM

## 2020-06-05 DIAGNOSIS — I4819 Other persistent atrial fibrillation: Secondary | ICD-10-CM

## 2020-06-05 DIAGNOSIS — R350 Frequency of micturition: Secondary | ICD-10-CM

## 2020-06-05 DIAGNOSIS — R7989 Other specified abnormal findings of blood chemistry: Secondary | ICD-10-CM

## 2020-06-05 LAB — BASIC METABOLIC PANEL
BUN/Creatinine Ratio: 18 (ref 10–24)
BUN: 20 mg/dL (ref 8–27)
CO2: 24 mmol/L (ref 20–29)
Calcium: 9.3 mg/dL (ref 8.6–10.2)
Chloride: 101 mmol/L (ref 96–106)
Creatinine, Ser: 1.14 mg/dL (ref 0.76–1.27)
GFR calc Af Amer: 78 mL/min/{1.73_m2} (ref >59)
GFR calc non Af Amer: 67 mL/min/{1.73_m2} (ref >59)
Glucose: 90 mg/dL (ref 65–99)
Potassium: 4.2 mmol/L (ref 3.5–5.2)
Sodium: 139 mmol/L (ref 134–144)

## 2020-06-05 LAB — LIPID PANEL
Chol/HDL Ratio: 3.5 ratio (ref 0.0–5.0)
Cholesterol, Total: 207 mg/dL — ABNORMAL HIGH (ref 100–199)
HDL: 59 mg/dL (ref >39)
LDL Chol Calc (NIH): 123 mg/dL — ABNORMAL HIGH (ref 0–99)
Triglycerides: 140 mg/dL (ref 0–149)
VLDL Cholesterol Cal: 25 mg/dL (ref 5–40)

## 2020-06-05 LAB — PSA: Prostate Specific Ag, Serum: 2.1 ng/mL (ref 0.0–4.0)

## 2020-06-05 MED ORDER — PROPAFENONE HCL ER 425 MG PO CP12: 425.0000 mg | ORAL_CAPSULE | Freq: Two times a day (BID) | ORAL | 1 refills | Status: DC

## 2020-06-05 MED ORDER — AMLODIPINE BESY-BENAZEPRIL HCL 10-40 MG PO CAPS: 1.0000 | ORAL_CAPSULE | Freq: Every day | ORAL | 3 refills | Status: DC

## 2020-06-05 MED ORDER — METOPROLOL SUCCINATE ER 50 MG PO TB24: 50.0000 mg | ORAL_TABLET | Freq: Every day | ORAL | 2 refills | Status: DC

## 2020-06-05 NOTE — Patient Instructions (Addendum)
Medication Instructions:  Your physician has recommended you make the following change in your medication:  1. STOP Carvedilol 2. START Toprol 50 mg once daily  *If you need a refill on your cardiac medications before your next appointment, please call your pharmacy*   Lab Work: Today: BMET, Lipid profile and PSA If you have labs (blood work) drawn today and your tests are completely normal, you will receive your results only by: Marland Kitchen MyChart Message (if you have MyChart) OR . A paper copy in the mail If you have any lab test that is abnormal or we need to change your treatment, we will call you to review the results.   Testing/Procedures: None ordered   Follow-Up: At [facility], you and your health needs are our priority.  As part of our continuing mission to provide you with exceptional heart care, we have created designated Provider Care Teams.  These Care Teams include your primary Cardiologist (physician) and Advanced Practice Providers (APPs -  Physician Assistants and Nurse Practitioners) who all work together to provide you with the care you need, when you need it.  Your next appointment:   6 month(s)  The format for your next appointment:   In Person  Provider:   Allegra Lai, MD    Thank you for choosing [facility]!!   Trinidad Curet, RN [phone]   Other Instructions  Monitor your blood pressure at home.  Please call the office if it averages higher than 130s/80s.

## 2020-06-05 NOTE — Progress Notes (Signed)
Electrophysiology Office Note   Date:  06/05/2020   ID:  Adrian Clark, DOB [birth year], MRN [id number]  PCP:  [name], MD  Cardiologist:  [name] Primary Electrophysiologist:  Daishaun Ayre Meredith Leeds, MD    No chief complaint on file.    History of Present Illness: Adrian Clark is a 64 y.o. male who presents today for electrophysiology evaluation.     He has a history of paroxysmal atrial fibrillation status post ablation in 2013 at [facility] in [place], [place], and hypertension.  He was admitted January 2015 with atrial fibrillation and rapid rates.  He had a left atrial appendage thrombus on TEE.  He eventually converted on his own back to sinus rhythm.  He was placed on Xarelto.  He underwent repeat ablation 06/06/2016 but unfortunately his right superior vein was not isolated.  Today, denies symptoms of palpitations, chest pain, shortness of breath, orthopnea, PND, lower extremity edema, claudication, dizziness, presyncope, syncope, bleeding, or neurologic sequela. The patient is tolerating medications without difficulties.  Over the last year, he has had 2 episodes of atrial fibrillation, lasting 18 to 36 hours.  In between those episodes he has done well and is without complaint.  Past Medical History:  Diagnosis Date  . HTN (hypertension)   . OSA (obstructive sleep apnea)   . PAF (paroxysmal atrial fibrillation) ([place]) 2013   s/p ablation    Past Surgical History:  Procedure Laterality Date  . ABLATION     AFIB  . CARDIOVERSION N/A 07/11/2013   Procedure: CARDIOVERSION;  Surgeon: [name], MD;  Location: [place];  Service: Cardiovascular;  Laterality: N/A;  . CARDIOVERSION N/A 08/20/2015   Procedure: CARDIOVERSION;  Surgeon: [name], MD;  Location: [place] ENDOSCOPY;  Service: Cardiovascular;  Laterality: N/A;  . CARDIOVERSION N/A 06/20/2016   Procedure: CARDIOVERSION;  Surgeon: [name], MD;  Location: [place];  Service:  Cardiovascular;  Laterality: N/A;  . CARDIOVERSION N/A 08/02/2018   Procedure: CARDIOVERSION;  Surgeon: [name], MD;  Location: [facility] ENDOSCOPY;  Service: Cardiovascular;  Laterality: N/A;  . CARDIOVERSION N/A 02/25/2019   Procedure: CARDIOVERSION;  Surgeon: Lelon Perla, MD;  Location: [facility] ENDOSCOPY;  Service: Cardiovascular;  Laterality: N/A;  . CARDIOVERSION N/A 05/23/2019   Procedure: CARDIOVERSION;  Surgeon: Acie Fredrickson Wonda Cheng, MD;  Location: [place];  Service: Cardiovascular;  Laterality: N/A;  . CHOLECYSTECTOMY    . COLONOSCOPY N/A 02/06/2017   Procedure: COLONOSCOPY;  Surgeon: [name], MD;  Location: [facility] ENDOSCOPY;  Service: Endoscopy;  Laterality: N/A;  . ELECTROPHYSIOLOGIC STUDY N/A 06/06/2016   Procedure: Atrial Fibrillation Ablation;  Surgeon: Akilah Cureton Meredith Leeds, MD;  Location: [place] CV LAB;  Service: Cardiovascular;  Laterality: N/A;  . TEE WITHOUT CARDIOVERSION N/A 07/11/2013   Procedure: TRANSESOPHAGEAL ECHOCARDIOGRAM (TEE);  Surgeon: [name], MD;  Location[facility] ENDOSCOPY;  Service: Cardiovascular;  Laterality: N/A;  . TONSILLECTOMY    . VASECTOMY       Current Outpatient Medications  Medication Sig Dispense Refill  . amLODipine-benazepril (LOTREL) 10-40 MG capsule Take 1 capsule by mouth daily. 90 capsule 3  . propafenone (RYTHMOL SR) 425 MG 12 hr capsule Take 1 capsule (425 mg total) by mouth 2 (two) times daily. 180 capsule 1  . metoprolol succinate (TOPROL-XL) 50 MG 24 hr tablet Take 1 tablet (50 mg total) by mouth daily. Take with or immediately following a meal. 90 tablet 2   No current facility-administered medications for this visit.    Allergies:  Poison ivy extract   Social History:  The patient  reports that he has never smoked. He has never used smokeless tobacco. He reports current alcohol use of about 2.0 standard drinks of alcohol per week. He reports that he does not use drugs.   Family History:  The patient's family history  includes Prostate cancer in his father; Uterine cancer in his mother.   ROS:  Please see the history of present illness.   Otherwise, review of systems is positive for none.   All other systems are reviewed and negative.   PHYSICAL EXAM: VS:  BP (!) 146/90   Pulse 61   Ht 6\' 4"  (1.93 m)   Wt 261 lb (118.4 kg)   SpO2 99%   BMI 31.77 kg/m  , BMI Body mass index is 31.77 kg/m. GEN: Well nourished, well developed, in no acute distress  HEENT: normal  Neck: no JVD, carotid bruits, or masses Cardiac: RRR; no murmurs, rubs, or gallops,no edema  Respiratory:  clear to auscultation bilaterally, normal work of breathing GI: soft, nontender, nondistended, + BS MS: no deformity or atrophy  Skin: warm and dry Neuro:  Strength and sensation are intact Psych: euthymic mood, full affect  EKG:  EKG is ordered today. Personal review of the ekg ordered shows sinus rhythm  Recent Labs: No results found for requested labs within last 8760 hours.    Lipid Panel     Component Value Date/Time   CHOL 177 05/27/2016 0844   TRIG 106 05/27/2016 0844   HDL 53 05/27/2016 0844   CHOLHDL 3.3 05/27/2016 0844   VLDL 21 05/27/2016 0844   LDLCALC 103 (H) 05/27/2016 0844     Wt Readings from Last 3 Encounters:  06/05/20 261 lb (118.4 kg)  05/31/19 244 lb (110.7 kg)  05/17/19 244 lb 4.8 oz (110.8 kg)      Other studies Reviewed: Additional studies/ records that were reviewed today include: TEE 07/11/13  Review of the above records today demonstrates:  - Left ventricle: Systolic function was normal. The estimated ejection fraction was in the range of 55% to 60%. Wall motion was normal; there were no regional wall motion abnormalities. - Aortic valve: Trivial regurgitation. - Mitral valve: Mild regurgitation. - Left atrium: The atrium was moderately dilated. No evidence of thrombus in the atrial cavity. There was a thrombus. Cannot exclude thrombus in the appendage. There was  spontaneous echo contrast ("smoke"). - Right atrium: No evidence of thrombus in the atrial cavity or appendage.   ASSESSMENT AND PLAN:  1.  Persistent atrial fibrillation: Status post ablation 06/06/2016 though unable to isolate the right upper pulmonary vein.  He has had multiple episodes of atrial fibrillation and is now on low-dose propafenone, monitoring for high risk medication.  CHA2DS2-VASc of 2.  He is overall remained in sinus rhythm.  His CHA2DS2-VASc is elevated, but he would prefer to take his Xarelto as needed as he is quite symptomatic when he is in atrial fibrillation.  I did tell him that if he wishes to get off of the propafenone, ablation would be a reasonable option.  2.  Obstructive sleep apnea: CPAP compliance encouraged  3.  Hypertension: Blood pressure is elevated today.  Switching from carvedilol to metoprolol due to fatigue.  We Vernon Ariel have him monitor his blood pressure at home after this change.  We Markasia Carrol check a PSA, BMP, and lipid panel today.  He is having increased urinary frequency.   Current medicines are reviewed at length with  the patient today.   The patient does not have concerns regarding his medicines.  The following changes were made today: Stop Coreg, start Toprol-XL  Labs/ tests ordered today include:  Orders Placed This Encounter  Procedures  . PSA  . Basic metabolic panel  . Lipid Profile  . EKG 12-Lead     Disposition:   FU with Jawon Dipiero 6 months  Signed, Tzirel Leonor Meredith Leeds, MD  06/05/2020 10:12 AM     [facility] [address] [place] [address] [phone] (office) [phone] (fax)

## 2020-06-06 ENCOUNTER — Telehealth: Payer: Self-pay | Admitting: *Deleted

## 2020-06-06 NOTE — Telephone Encounter (Signed)
Patient is aware and agreeable to AHI being within range at 0.4. Patient is aware and agreeable to being in compliance with machine usage Patient is aware and agreeable to no change in current pressures.

## 2020-06-06 NOTE — Telephone Encounter (Signed)
-----   Message from [name], MD sent at 06/05/2020  9:48 AM EST ----- Regarding: RE: Download Normal download ----- Message ----- From: Freada Bergeron, CMA Sent: 06/05/2020   9:37 AM EST To: [name], MD Subject: Adrian Clark, Adrian Clark 03/08/2020 - 06/05/2020 Patient ID: [id number] DOB: 1955/07/15 Age: 64 years 30.5 [place] (Therapist) [place], [id number] Compliance Report Usage 03/08/2020 - 06/05/2020 Usage days 90/90 days (100%) >= 4 hours 89 days (99%) < 4 hours 1 days (1%) Usage hours 515 hours 57 minutes Average usage (total days) 5 hours 44 minutes Average usage (days used) 5 hours 44 minutes Median usage (days used) 5 hours 43 minutes S9 Elite Serial number [id number] Mode CPAP Set pressure 6 cmH2O EPR Fulltime EPR level 3 Therapy Leaks - L/min Median: 7.1 95th percentile: 8.5 Maximum: 14.2 Events per hour AI: 0.1 HI: 0.3 AHI: 0.4 Apnea Index Central: 0.0 Obstructive: 0.1 Unknown: 0.0 Usage - hours Printed on 06/05/2020 - ResMed AirView version 4.28.0-5.0 Page 1 of 1 Adrian Clark, Adrian Clark 03/08/2020 - 06/05/2020 Patient ID: [id number] DOB: 1955-07-21 Age: 15 years 30.5 [place] (Therapist) [place], [place] Therapy Report S9 Elite SN: [id number] Usage (hours) Usage days 90/90 (100%) >= 4 hour days 89 (99%) < 4 hour days 1 (1%) Days not used 0 (0%) Days no data 0 (0%) Used/day (avg.) 5.7 hrs. Leak (L/min) Set threshold 24.0 L/min Maximum (avg) 14.2 95th % (avg) 8.5 Median (avg) 7.1 Pressure (cmH2O) Mode CPAP Set EPR Fulltime, 3.0 Set pressure 6.0 AHI (events/hour) AHI 0.4 HI 0.3 AI 0.1 CAI 0.0 OAI 0.1 UAI 0.0 Printed on 06/05/2020 - ResMed AirView version 4.28.0-5.0 Page 1 of 1

## 2020-06-13 ENCOUNTER — Telehealth: Payer: Self-pay | Admitting: *Deleted

## 2020-06-13 DIAGNOSIS — Z1211 Encounter for screening for malignant neoplasm of colon: Secondary | ICD-10-CM | POA: Diagnosis not present

## 2020-06-13 NOTE — Telephone Encounter (Signed)
   [facility] Pre-operative Risk Assessment    HEARTCARE STAFF: - Please ensure there is not already an duplicate clearance open for this procedure. - Under Visit Info/Reason for Call, type in Other and utilize the format Clearance MM/DD/YY or Clearance TBD. Do not use dashes or single digits. - If request is for dental extraction, please clarify the # of teeth to be extracted.  Request for surgical clearance:  1. What type of surgery is being performed? COLONOSCOPY   2. When is this surgery scheduled? 07/12/20   3. What type of clearance is required (medical clearance vs. Pharmacy clearance to hold med vs. Both)? MEDICAL  4. Are there any medications that need to be held prior to surgery and how long? NONE LISTED   5. Practice name and name of physician performing surgery? [facility]; DR. HUNG   6. What is the office phone number? [phone]   7.   What is the office fax number? [phone]  8.   Anesthesia type (None, local, MAC, general) ? PROPOFOL   Julaine Hua 06/13/2020, 3:08 PM  _________________________________________________________________   (provider comments below)

## 2020-06-14 NOTE — Telephone Encounter (Signed)
   Primary Cardiologist: Will Meredith Leeds, MD  Chart reviewed as part of pre-operative protocol coverage. Given past medical history and time since last visit, based on ACC/AHA guidelines, Adrian Clark would be at acceptable risk for the planned procedure without further cardiovascular testing.   I will route this recommendation to the requesting party via Epic fax function and remove from pre-op pool.  Please call with questions.  Adrian Clark. Kayson Bullis NP-C    06/14/2020, [place] [address] Office [phone] Fax [phone]

## 2020-06-18 ENCOUNTER — Telehealth: Payer: Self-pay | Admitting: *Deleted

## 2020-06-18 NOTE — Telephone Encounter (Signed)
Patient is aware and agreeable to AHI being within range at 0.4. Patient is aware and agreeable to being in compliance with machine usage Patient is aware and agreeable to no change in current pressures.

## 2020-06-18 NOTE — Telephone Encounter (Signed)
-----   Message from [name], MD sent at 06/08/2020  4:16 PM EST ----- Good AHI and compliance.  Continue current PAP settings.

## 2020-07-12 DIAGNOSIS — Z86010 Personal history of colonic polyps: Secondary | ICD-10-CM | POA: Diagnosis not present

## 2020-07-12 DIAGNOSIS — K635 Polyp of colon: Secondary | ICD-10-CM | POA: Diagnosis not present

## 2020-07-12 DIAGNOSIS — K6389 Other specified diseases of intestine: Secondary | ICD-10-CM | POA: Diagnosis not present

## 2020-07-12 DIAGNOSIS — D124 Benign neoplasm of descending colon: Secondary | ICD-10-CM | POA: Diagnosis not present

## 2020-07-12 DIAGNOSIS — D120 Benign neoplasm of cecum: Secondary | ICD-10-CM | POA: Diagnosis not present

## 2020-08-03 DIAGNOSIS — D1801 Hemangioma of skin and subcutaneous tissue: Secondary | ICD-10-CM | POA: Diagnosis not present

## 2020-08-03 DIAGNOSIS — L918 Other hypertrophic disorders of the skin: Secondary | ICD-10-CM | POA: Diagnosis not present

## 2020-08-03 DIAGNOSIS — L821 Other seborrheic keratosis: Secondary | ICD-10-CM | POA: Diagnosis not present

## 2020-08-03 DIAGNOSIS — D2260 Melanocytic nevi of unspecified upper limb, including shoulder: Secondary | ICD-10-CM | POA: Diagnosis not present

## 2020-09-19 NOTE — Telephone Encounter (Signed)
Followed up with pt.   He reports that he knows he has not answered back. He is going to further look into the cost issue, he thinks he may have been asking for refill too soon. He states that he will let me know if he needs further assistance and/or Rx sent in. He appreciates my follow up

## 2020-09-24 ENCOUNTER — Ambulatory Visit: Payer: BC Managed Care – PPO | Admitting: Cardiology

## 2020-12-07 ENCOUNTER — Other Ambulatory Visit: Payer: Self-pay

## 2020-12-07 ENCOUNTER — Ambulatory Visit (INDEPENDENT_AMBULATORY_CARE_PROVIDER_SITE_OTHER): Payer: BC Managed Care – PPO | Admitting: Cardiology

## 2020-12-07 ENCOUNTER — Encounter: Payer: Self-pay | Admitting: Cardiology

## 2020-12-07 VITALS — BP 146/92 | HR 60 | Ht 76.0 in | Wt 264.6 lb

## 2020-12-07 DIAGNOSIS — I4819 Other persistent atrial fibrillation: Secondary | ICD-10-CM | POA: Diagnosis not present

## 2020-12-07 NOTE — Progress Notes (Signed)
Electrophysiology Office Note   Date:  12/07/2020   ID:  Adrian Clark, DOB 1956/03/30, MRN [id number]  PCP:  [name], MD  Cardiologist:  [name] Primary Electrophysiologist:  Ulysses Alper Meredith Leeds, MD    No chief complaint on file.    History of Present Illness: Adrian Clark is a 65 y.o. male who presents today for electrophysiology evaluation.     He has a history significant for paroxysmal atrial fibrillation status post ablation in 2013 at [facility] in [place], [place], and hypertension.  He is admitted January 2015 with atrial fibrillation and rapid rates.  He had a left atrial appendage thrombus on TEE.  He was eventually cardioverted back to sinus rhythm and placed on Xarelto.  He underwent repeat ablation 06/06/2016 but unfortunately has right upper pulmonary vein was not isolated.  He has since been started on propafenone.  Today, denies symptoms of palpitations, chest pain, shortness of breath, orthopnea, PND, lower extremity edema, claudication, dizziness, presyncope, syncope, bleeding, or neurologic sequela. The patient is tolerating medications without difficulties.  Since being seen he has done well.  He has had no further episodes of atrial fibrillation.  Unfortunately his propafenone caused it significantly on him.  He has not taken propafenone in the last 4 weeks.  He has had no further episodes of atrial fibrillation.  Unfortunately he had COVID at the end of May, but fortunately did not develop atrial fibrillation.  Past Medical History:  Diagnosis Date   HTN (hypertension)    OSA (obstructive sleep apnea)    PAF (paroxysmal atrial fibrillation) ([place]) 2013   s/p ablation    Past Surgical History:  Procedure Laterality Date   ABLATION     AFIB   CARDIOVERSION N/A 07/11/2013   Procedure: CARDIOVERSION;  Surgeon: [name], MD;  Location: [place];  Service: Cardiovascular;  Laterality: N/A;   CARDIOVERSION N/A 08/20/2015   Procedure:  CARDIOVERSION;  Surgeon: [name], MD;  Location: [place];  Service: Cardiovascular;  Laterality: N/A;   CARDIOVERSION N/A 06/20/2016   Procedure: CARDIOVERSION;  Surgeon: [name], MD;  Location: [place];  Service: Cardiovascular;  Laterality: N/A;   CARDIOVERSION N/A 08/02/2018   Procedure: CARDIOVERSION;  Surgeon: [name], MD;  Location: [facility] ENDOSCOPY;  Service: Cardiovascular;  Laterality: N/A;   CARDIOVERSION N/A 02/25/2019   Procedure: CARDIOVERSION;  Surgeon: Lelon Perla, MD;  Location: [facility] ENDOSCOPY;  Service: Cardiovascular;  Laterality: N/A;   CARDIOVERSION N/A 05/23/2019   Procedure: CARDIOVERSION;  Surgeon: Acie Fredrickson Wonda Cheng, MD;  Location: [facility] ENDOSCOPY;  Service: Cardiovascular;  Laterality: N/A;   CHOLECYSTECTOMY     COLONOSCOPY N/A 02/06/2017   Procedure: COLONOSCOPY;  Surgeon: [name], MD;  Location: [facility] ENDOSCOPY;  Service: Endoscopy;  Laterality: N/A;   ELECTROPHYSIOLOGIC STUDY N/A 06/06/2016   Procedure: Atrial Fibrillation Ablation;  Surgeon: Gregg Winchell Meredith Leeds, MD;  Location: [place] CV LAB;  Service: Cardiovascular;  Laterality: N/A;   TEE WITHOUT CARDIOVERSION N/A 07/11/2013   Procedure: TRANSESOPHAGEAL ECHOCARDIOGRAM (TEE);  Surgeon: [name], MD;  Location: [facility] ENDOSCOPY;  Service: Cardiovascular;  Laterality: N/A;   TONSILLECTOMY     VASECTOMY       Current Outpatient Medications  Medication Sig Dispense Refill   amLODipine-benazepril (LOTREL) 10-40 MG capsule Take 1 capsule by mouth daily. 90 capsule 3   metoprolol succinate (TOPROL-XL) 50 MG 24 hr tablet Take 1 tablet (50 mg total) by mouth daily. Take with or immediately following a meal. 90  tablet 2   propafenone (RYTHMOL SR) 425 MG 12 hr capsule Take 1 capsule (425 mg total) by mouth 2 (two) times daily. 180 capsule 1   No current facility-administered medications for this visit.    Allergies:   Poison ivy extract   Social History:  The patient  reports that he  has never smoked. He has never used smokeless tobacco. He reports current alcohol use of about 2.0 standard drinks of alcohol per week. He reports that he does not use drugs.   Family History:  The patient's family history includes Prostate cancer in his father; Uterine cancer in his mother.   ROS:  Please see the history of present illness.   Otherwise, review of systems is positive for none.   All other systems are reviewed and negative.   PHYSICAL EXAM: VS:  There were no vitals taken for this visit. , BMI There is no height or weight on file to calculate BMI. GEN: Well nourished, well developed, in no acute distress  HEENT: normal  Neck: no JVD, carotid bruits, or masses Cardiac: RRR; no murmurs, rubs, or gallops,no edema  Respiratory:  clear to auscultation bilaterally, normal work of breathing GI: soft, nontender, nondistended, + BS MS: no deformity or atrophy  Skin: warm and dry Neuro:  Strength and sensation are intact Psych: euthymic mood, full affect  EKG:  EKG is ordered today. Personal review of the ekg ordered shows sinus rhythm, rate 60  Recent Labs: 06/05/2020: BUN 20; Creatinine, Ser 1.14; Potassium 4.2; Sodium 139    Lipid Panel     Component Value Date/Time   CHOL 207 (H) 06/05/2020 1006   TRIG 140 06/05/2020 1006   HDL 59 06/05/2020 1006   CHOLHDL 3.5 06/05/2020 1006   CHOLHDL 3.3 05/27/2016 0844   VLDL 21 05/27/2016 0844   LDLCALC 123 (H) 06/05/2020 1006     Wt Readings from Last 3 Encounters:  06/05/20 261 lb (118.4 kg)  05/31/19 244 lb (110.7 kg)  05/17/19 244 lb 4.8 oz (110.8 kg)      Other studies Reviewed: Additional studies/ records that were reviewed today include: TEE 07/11/13  Review of the above records today demonstrates:  - Left ventricle: Systolic function was normal. The   estimated ejection fraction was in the range of 55% to   60%. Wall motion was normal; there were no regional wall   motion abnormalities. - Aortic valve: Trivial  regurgitation. - Mitral valve: Mild regurgitation. - Left atrium: The atrium was moderately dilated. No   evidence of thrombus in the atrial cavity. There was a   thrombus. Cannot exclude thrombus in the appendage. There   was spontaneous echo contrast ("smoke"). - Right atrium: No evidence of thrombus in the atrial cavity   or appendage.   ASSESSMENT AND PLAN:  1.  Persistent atrial fibrillation: Status post ablation 06/06/2016 though unable to isolate the right upper pulmonary vein.  He has had multiple episodes of atrial fibrillation and now is on low-dose propafenone.  CHA2DS2-VASc of 2.  High risk medication monitoring.  He is now taking his Xarelto as needed as he is quite symptomatic when he has episodes of atrial fibrillation.  His propafenone cost went up significantly.  He has not taken in the last couple weeks.  He Adrian Clark continue to hold off as he has had no further episodes of atrial fibrillation.  2.  Obstructive sleep apnea: CPAP compliance encouraged  3.  Hypertension: Elevated today but well controlled at home.  No changes.    Current medicines are reviewed at length with the patient today.   The patient does not have concerns regarding his medicines.  The following changes were made today: Stop propafenone  Labs/ tests ordered today include:  No orders of the defined types were placed in this encounter.    Disposition:   FU with Adrian Clark 6 months  Signed, Adrian Thatch Meredith Leeds, MD  12/07/2020 7:30 AM     [facility] [address] [place] [address] [phone] (office) [phone] (fax)

## 2020-12-07 NOTE — Patient Instructions (Signed)
Medication Instructions:  Your physician recommends that you continue on your current medications as directed. Please refer to the Current Medication list given to you today.  *If you need a refill on your cardiac medications before your next appointment, please call your pharmacy*   Lab Work: None ordered If you have labs (blood work) drawn today and your tests are completely normal, you will receive your results only by: [place] (if you have MyChart) OR A paper copy in the mail If you have any lab test that is abnormal or we need to change your treatment, we will call you to review the results.   Testing/Procedures: None ordered   Follow-Up: At [facility], you and your health needs are our priority.  As part of our continuing mission to provide you with exceptional heart care, we have created designated Provider Care Teams.  These Care Teams include your primary Cardiologist (physician) and Advanced Practice Providers (APPs -  Physician Assistants and Nurse Practitioners) who all work together to provide you with the care you need, when you need it.  We recommend signing up for the patient portal called "MyChart".  Sign up information is provided on this After Visit Summary.  MyChart is used to connect with patients for Virtual Visits (Telemedicine).  Patients are able to view lab/test results, encounter notes, upcoming appointments, etc.  Non-urgent messages can be sent to your provider as well.   To learn more about what you can do with MyChart, go to [email or web address].    Your next appointment:   6 month(s)  The format for your next appointment:   In Person  Provider:   Allegra Lai, MD  You have been referred to Dr. [name] to reestablish with sleep follow up.   Thank you for choosing [facility]!!   Trinidad Curet, RN [phone]   Other Instructions

## 2020-12-21 ENCOUNTER — Telehealth: Payer: Self-pay | Admitting: *Deleted

## 2020-12-21 NOTE — Telephone Encounter (Signed)
-----   Message from [name], MD sent at 12/12/2020  3:26 PM EDT ----- Good AHI and compliance.  Continue current PAP settings.

## 2020-12-21 NOTE — Telephone Encounter (Signed)
The patient has been notified of the result and verbalized understanding.  All questions (if any) were answered. Marolyn Hammock, [place] 12/21/2020 1:02 PM

## 2021-02-20 ENCOUNTER — Other Ambulatory Visit: Payer: Self-pay | Admitting: Cardiology

## 2021-02-25 ENCOUNTER — Ambulatory Visit: Payer: BC Managed Care – PPO | Admitting: Cardiology

## 2021-03-08 ENCOUNTER — Other Ambulatory Visit: Payer: Self-pay

## 2021-03-08 ENCOUNTER — Ambulatory Visit: Payer: BC Managed Care – PPO | Admitting: Cardiology

## 2021-03-08 ENCOUNTER — Encounter: Payer: Self-pay | Admitting: Cardiology

## 2021-03-08 VITALS — BP 136/86 | HR 75 | Ht 75.0 in | Wt 260.4 lb

## 2021-03-08 DIAGNOSIS — I10 Essential (primary) hypertension: Secondary | ICD-10-CM | POA: Diagnosis not present

## 2021-03-08 DIAGNOSIS — G4733 Obstructive sleep apnea (adult) (pediatric): Secondary | ICD-10-CM

## 2021-03-08 NOTE — Progress Notes (Signed)
Cardiology Office Note    Date:  03/08/2021   ID:  Adrian Clark, DOB [birth year], MRN [id number]  PCP:  [name], MD  Electrophysiologist:Alyssa Curt Bears, MD Sleep Medicine:  Fransico Him MD   Chief Complaint  Patient presents with   Sleep Apnea   Hypertension     History of Present Illness:  Adrian Clark is a 64 y.o. male with a history of PAF s/p status post ablation in 2013 [facility] in [place], [place]), OSA, HTN.  Admitted in 1/15 with AF with RVR.  He had LA thrombus on TEE.  He eventually converted to NSR on his own.  Patient was back in atrial fibrillation 07/2015  CHADS2-VASc=1 (HTN).  He was placed on Xarelto 20 mg QD with plans to proceed with DCCV after 4 weeks of uninterrupted anticoagulation.   He underwent DCCV 08/20/15.   He is now followed by EP for his afib.  He also has a hx of OSA and has been on CPAP but lost to followup with me.  He is doing well with his CPAP device.  He tolerates the mask and feels the pressure is adequate.  Since going on CPAP he feels rested in the am and has no significant daytime sleepiness.  He denies any significant mouth or nasal dryness or nasal congestion.  He does not think that he snores.     Past Medical History:  Diagnosis Date   HTN (hypertension)    OSA (obstructive sleep apnea)    PAF (paroxysmal atrial fibrillation) ([place]) 2013   s/p ablation     Past Surgical History:  Procedure Laterality Date   ABLATION     AFIB   CARDIOVERSION N/A 07/11/2013   Procedure: CARDIOVERSION;  Surgeon: [name], MD;  Location: [place];  Service: Cardiovascular;  Laterality: N/A;   CARDIOVERSION N/A 08/20/2015   Procedure: CARDIOVERSION;  Surgeon: [name], MD;  Location: [place];  Service: Cardiovascular;  Laterality: N/A;   CARDIOVERSION N/A 06/20/2016   Procedure: CARDIOVERSION;  Surgeon: [name], MD;  Location: [place];  Service: Cardiovascular;  Laterality: N/A;   CARDIOVERSION N/A  08/02/2018   Procedure: CARDIOVERSION;  Surgeon: [name], MD;  Location: [facility] ENDOSCOPY;  Service: Cardiovascular;  Laterality: N/A;   CARDIOVERSION N/A 02/25/2019   Procedure: CARDIOVERSION;  Surgeon: Lelon Perla, MD;  Location: [facility] ENDOSCOPY;  Service: Cardiovascular;  Laterality: N/A;   CARDIOVERSION N/A 05/23/2019   Procedure: CARDIOVERSION;  Surgeon: Acie Fredrickson Wonda Cheng, MD;  Location: [facility] ENDOSCOPY;  Service: Cardiovascular;  Laterality: N/A;   CHOLECYSTECTOMY     COLONOSCOPY N/A 02/06/2017   Procedure: COLONOSCOPY;  Surgeon: [name], MD;  Location: [facility] ENDOSCOPY;  Service: Endoscopy;  Laterality: N/A;   ELECTROPHYSIOLOGIC STUDY N/A 06/06/2016   Procedure: Atrial Fibrillation Ablation;  Surgeon: Will Meredith Leeds, MD;  Location: [place] CV LAB;  Service: Cardiovascular;  Laterality: N/A;   TEE WITHOUT CARDIOVERSION N/A 07/11/2013   Procedure: TRANSESOPHAGEAL ECHOCARDIOGRAM (TEE);  Surgeon: [name], MD;  Location: [facility] ENDOSCOPY;  Service: Cardiovascular;  Laterality: N/A;   TONSILLECTOMY     VASECTOMY      Current Medications: Outpatient Medications Prior to Visit  Medication Sig Dispense Refill   amLODipine-benazepril (LOTREL) 10-40 MG capsule Take 1 capsule by mouth daily. 90 capsule 3   metoprolol succinate (TOPROL-XL) 50 MG 24 hr tablet TAKE 1 TABLET DAILY WITH OR IMMEDIATELY FOLLOWING A MEAL (STOPPING CARVEDILOL) 90 tablet 1   No facility-administered medications  prior to visit.     Allergies:   Poison ivy extract   Social History   Socioeconomic History   Marital status: Married    Spouse name: Not on file   Number of children: Not on file   Years of education: Not on file   Highest education level: Not on file  Occupational History   Not on file  Tobacco Use   Smoking status: Never   Smokeless tobacco: Never  Vaping Use   Vaping Use: Never used  Substance and Sexual Activity   Alcohol use: Yes    Alcohol/week: 2.0 standard drinks    Types: 2  Glasses of wine per week   Drug use: No   Sexual activity: Not on file  Other Topics Concern   Not on file  Social History Narrative   Not on file   Social Determinants of Health   Financial Resource Strain: Not on file  Food Insecurity: Not on file  Transportation Needs: Not on file  Physical Activity: Not on file  Stress: Not on file  Social Connections: Not on file     Family History:  The patient's family history includes Prostate cancer in his father; Uterine cancer in his mother.   ROS:   Please see the history of present illness.    Review of Systems  Musculoskeletal:  Positive for back pain.  All other systems reviewed and are negative.   PHYSICAL EXAM:   VS:  BP 136/86   Pulse 75   Ht '6\' 3"'$  (1.905 m)   Wt 260 lb 6.4 oz (118.1 kg)   SpO2 95%   BMI 32.55 kg/m    GEN: Well nourished, well developed in no acute distress HEENT: Normal NECK: No JVD; No carotid bruits LYMPHATICS: No lymphadenopathy CARDIAC:RRR, no murmurs, rubs, gallops RESPIRATORY:  Clear to auscultation without rales, wheezing or rhonchi  ABDOMEN: Soft, non-tender, non-distended MUSCULOSKELETAL:  No edema; No deformity  SKIN: Warm and dry NEUROLOGIC:  Alert and oriented x 3 PSYCHIATRIC:  Normal affect   Wt Readings from Last 3 Encounters:  03/08/21 260 lb 6.4 oz (118.1 kg)  12/07/20 264 lb 9.6 oz (120 kg)  06/05/20 261 lb (118.4 kg)      Studies/Labs Reviewed:   EKG:  EKG is not ordered today.   Recent Labs: 06/05/2020: BUN 20; Creatinine, Ser 1.14; Potassium 4.2; Sodium 139   Lipid Panel    Component Value Date/Time   CHOL 207 (H) 06/05/2020 1006   TRIG 140 06/05/2020 1006   HDL 59 06/05/2020 1006   CHOLHDL 3.5 06/05/2020 1006   CHOLHDL 3.3 05/27/2016 0844   VLDL 21 05/27/2016 0844   LDLCALC 123 (H) 06/05/2020 1006    Additional studies/ records that were reviewed today include:  none    ASSESSMENT:    1. OSA (obstructive sleep apnea)   2. Primary hypertension       PLAN:  In order of problems listed above:  OSA - The patient is tolerating PAP therapy well without any problems. The PAP download performed by his DME was personally reviewed and interpreted by me today and showed an AHI of 0.7/hr on 6 cm H2O with 100% compliance in using more than 4 hours nightly.  The patient has been using and benefiting from PAP use and will continue to benefit from therapy.  -his device is over 59 years old and a part broke off and he would like a new device -I will order him a new ResMed CPAP  at 6cm H2O with heated humidity and mask of choice  HTN -BP controlled on exam today -Continue prescription drug management with Toprol XL '50mg'$  daily and Amlodipine/Benazelpril 10-'40mg'$  daily with PRN refills    Medication Adjustments/Labs and Tests Ordered: Current medicines are reviewed at length with the patient today.  Concerns regarding medicines are outlined above.  Medication changes, Labs and Tests ordered today are listed in the Patient Instructions below.  There are no Patient Instructions on file for this visit.   Signed, Fransico Him, MD  03/08/2021 2:52 PM    [facility] [address], [place], [name]  [address] Phone: [phone]; Fax: [phone]

## 2021-03-08 NOTE — Patient Instructions (Addendum)
Medication Instructions:  Your physician recommends that you continue on your current medications as directed. Please refer to the Current Medication list given to you today.  *If you need a refill on your cardiac medications before your next appointment, please call your pharmacy*   Lab Work: NONE  If you have labs (blood work) drawn today and your tests are completely normal, you will receive your results only by: [place] (if you have MyChart) OR A paper copy in the mail If you have any lab test that is abnormal or we need to change your treatment, we will call you to review the results.   Testing/Procedures: NONE   Follow-Up: 4 weeks after CPAP started   Other Instructions Our Sleep Coordinator will order Resmed CPAP 6 cm H20 with heated humidity and mask of choice

## 2021-03-27 ENCOUNTER — Telehealth: Payer: Self-pay | Admitting: *Deleted

## 2021-03-27 DIAGNOSIS — G4733 Obstructive sleep apnea (adult) (pediatric): Secondary | ICD-10-CM

## 2021-03-27 NOTE — Telephone Encounter (Signed)
Upon patient request DME selection is [facility] Patient understands he will be contacted by [facility] to set up his cpap. Patient understands to call if [facility] does not contact him with new setup in a timely manner. Patient understands they will be called once confirmation has been received from [facility] that they have received their new machine to schedule 10 week follow up appointment.   [facility] notified of new cpap order  Please add to [facility] Patient was grateful for the call and thanked me

## 2021-04-18 NOTE — Telephone Encounter (Signed)
Pt reports he has been in AFib since 03/29/23, after his dad passed away. States he restarted Xarelto on 9/17. Occasionally tired but no other symptoms/issues to report. He is ready  to schedule DCCV and  PVI, pt already scheduled to see [facility] on 11/1, offered sooner appt but he was ok with keeping week after next. Ablation date held for 12/5. Advised to contact office if sooner needs prior to OV. Patient verbalized understanding and agreeable to plan.

## 2021-04-30 ENCOUNTER — Ambulatory Visit: Payer: BC Managed Care – PPO | Admitting: Cardiology

## 2021-04-30 ENCOUNTER — Encounter (HOSPITAL_COMMUNITY): Payer: Self-pay | Admitting: Cardiovascular Disease

## 2021-04-30 ENCOUNTER — Encounter: Payer: Self-pay | Admitting: Cardiology

## 2021-04-30 ENCOUNTER — Other Ambulatory Visit: Payer: Self-pay

## 2021-04-30 VITALS — BP 148/88 | HR 101 | Ht 75.0 in | Wt 256.0 lb

## 2021-04-30 DIAGNOSIS — R7989 Other specified abnormal findings of blood chemistry: Secondary | ICD-10-CM | POA: Diagnosis not present

## 2021-04-30 DIAGNOSIS — R350 Frequency of micturition: Secondary | ICD-10-CM | POA: Diagnosis not present

## 2021-04-30 DIAGNOSIS — I4819 Other persistent atrial fibrillation: Secondary | ICD-10-CM

## 2021-04-30 DIAGNOSIS — Z01818 Encounter for other preprocedural examination: Secondary | ICD-10-CM

## 2021-04-30 DIAGNOSIS — Z01812 Encounter for preprocedural laboratory examination: Secondary | ICD-10-CM

## 2021-04-30 MED ORDER — RIVAROXABAN 20 MG PO TABS: 20.0000 mg | ORAL_TABLET | Freq: Every day | ORAL | 2 refills | Status: DC

## 2021-04-30 MED ORDER — RIVAROXABAN 20 MG PO TABS: 20.0000 mg | ORAL_TABLET | Freq: Every day | ORAL | 11 refills | Status: DC

## 2021-04-30 MED ORDER — FLECAINIDE ACETATE 100 MG PO TABS: 100.0000 mg | ORAL_TABLET | Freq: Two times a day (BID) | ORAL | 6 refills | Status: DC

## 2021-04-30 NOTE — Progress Notes (Signed)
Electrophysiology Office Note   Date:  04/30/2021   ID:  Adrian Clark, DOB 21-Sep-1955, MRN [id number]  PCP:  [name], MD  Cardiologist:  Adrian Clark Primary Electrophysiologist:  Matisse Salais Adrian Leeds, MD    No chief complaint on file.    History of Present Illness: Adrian Clark is a 65 y.o. male who presents today for electrophysiology evaluation.     He has a history significant for paroxysmal atrial fibrillation status post ablation in 2013 at [facility] in [place], [place], hypertension.  He was admitted January 2015 with atrial fibrillation rapid rates.  He had a left atrial appendage thrombus on TEE.  He was eventually cardioverted back to sinus rhythm and placed on Xarelto.  He underwent repeat ablation 06/06/2016 but unfortunately his right upper pulmonary vein was not isolated.  He was initially on propafenone but this was stopped due to increased costs.  Today, denies symptoms of palpitations, chest pain, shortness of breath, orthopnea, PND, lower extremity edema, claudication, dizziness, presyncope, syncope, bleeding, or neurologic sequela. The patient is tolerating medications without difficulties.  Unfortunately he has gone back into atrial fibrillation.  He has been in atrial fibrillation for approximately a month and a half.  He would like to get back into normal rhythm.  He has some fatigue and shortness of breath.  He was standing up for a long time yesterday and had to sit down and drink some water as he felt uneasy standing.  This symptom lasted for approximately 5 minutes and then went away and never returned.  Past Medical History:  Diagnosis Date   HTN (hypertension)    OSA (obstructive sleep apnea)    PAF (paroxysmal atrial fibrillation) ([place]) 2013   s/p ablation    Past Surgical History:  Procedure Laterality Date   ABLATION     AFIB   CARDIOVERSION N/A 07/11/2013   Procedure: CARDIOVERSION;  Surgeon: [name], MD;  Location: [place];   Service: Cardiovascular;  Laterality: N/A;   CARDIOVERSION N/A 08/20/2015   Procedure: CARDIOVERSION;  Surgeon: [name], MD;  Location: [place];  Service: Cardiovascular;  Laterality: N/A;   CARDIOVERSION N/A 06/20/2016   Procedure: CARDIOVERSION;  Surgeon: [name], MD;  Location: [place];  Service: Cardiovascular;  Laterality: N/A;   CARDIOVERSION N/A 08/02/2018   Procedure: CARDIOVERSION;  Surgeon: [name], MD;  Location: [facility] ENDOSCOPY;  Service: Cardiovascular;  Laterality: N/A;   CARDIOVERSION N/A 02/25/2019   Procedure: CARDIOVERSION;  Surgeon: Lelon Perla, MD;  Location: [facility] ENDOSCOPY;  Service: Cardiovascular;  Laterality: N/A;   CARDIOVERSION N/A 05/23/2019   Procedure: CARDIOVERSION;  Surgeon: Acie Fredrickson Wonda Cheng, MD;  Location: [facility] ENDOSCOPY;  Service: Cardiovascular;  Laterality: N/A;   CHOLECYSTECTOMY     COLONOSCOPY N/A 02/06/2017   Procedure: COLONOSCOPY;  Surgeon: [name], MD;  Location: [facility] ENDOSCOPY;  Service: Endoscopy;  Laterality: N/A;   ELECTROPHYSIOLOGIC STUDY N/A 06/06/2016   Procedure: Atrial Fibrillation Ablation;  Surgeon: Halil Rentz Adrian Leeds, MD;  Location: [place] CV LAB;  Service: Cardiovascular;  Laterality: N/A;   TEE WITHOUT CARDIOVERSION N/A 07/11/2013   Procedure: TRANSESOPHAGEAL ECHOCARDIOGRAM (TEE);  Surgeon: [name], MD;  Location: [facility] ENDOSCOPY;  Service: Cardiovascular;  Laterality: N/A;   TONSILLECTOMY     VASECTOMY       Current Outpatient Medications  Medication Sig Dispense Refill   amLODipine-benazepril (LOTREL) 10-40 MG capsule Take 1 capsule by mouth daily. 90 capsule 3   flecainide (TAMBOCOR) 100 MG tablet  Take 1 tablet (100 mg total) by mouth 2 (two) times daily. 60 tablet 6   metoprolol succinate (TOPROL-XL) 50 MG 24 hr tablet TAKE 1 TABLET DAILY WITH OR IMMEDIATELY FOLLOWING A MEAL (STOPPING CARVEDILOL) 90 tablet 1   rivaroxaban (XARELTO) 20 MG TABS tablet Take 1 tablet (20 mg total) by mouth daily  with supper. 90 tablet 2   No current facility-administered medications for this visit.    Allergies:   Poison ivy extract   Social History:  The patient  reports that he has never smoked. He has never used smokeless tobacco. He reports current alcohol use of about 2.0 standard drinks per week. He reports that he does not use drugs.   Family History:  The patient's family history includes Prostate cancer in his father; Uterine cancer in his mother.   ROS:  Please see the history of present illness.   Otherwise, review of systems is positive for none.   All other systems are reviewed and negative.   PHYSICAL EXAM: VS:  BP (!) 148/88   Pulse (!) 101   Ht 6\' 3"  (1.905 m)   Wt 256 lb (116.1 kg)   SpO2 97%   BMI 32.00 kg/m  , BMI Body mass index is 32 kg/m. GEN: Well nourished, well developed, in no acute distress  HEENT: normal  Neck: no JVD, carotid bruits, or masses Cardiac: RRR; no murmurs, rubs, or gallops,no edema  Respiratory:  clear to auscultation bilaterally, normal work of breathing GI: soft, nontender, nondistended, + BS MS: no deformity or atrophy  Skin: warm and dry Neuro:  Strength and sensation are intact Psych: euthymic mood, full affect  EKG:  EKG is ordered today. Personal review of the ekg ordered shows atrial fibrillation, rate 101  Recent Labs: 06/05/2020: BUN 20; Creatinine, Ser 1.14; Potassium 4.2; Sodium 139    Lipid Panel     Component Value Date/Time   CHOL 207 (H) 06/05/2020 1006   TRIG 140 06/05/2020 1006   HDL 59 06/05/2020 1006   CHOLHDL 3.5 06/05/2020 1006   CHOLHDL 3.3 05/27/2016 0844   VLDL 21 05/27/2016 0844   LDLCALC 123 (H) 06/05/2020 1006     Wt Readings from Last 3 Encounters:  04/30/21 256 lb (116.1 kg)  03/08/21 260 lb 6.4 oz (118.1 kg)  12/07/20 264 lb 9.6 oz (120 kg)      Other studies Reviewed: Additional studies/ records that were reviewed today include: TEE 07/11/13  Review of the above records today demonstrates:   - Left ventricle: Systolic function was normal. The   estimated ejection fraction was in the range of 55% to   60%. Wall motion was normal; there were no regional wall   motion abnormalities. - Aortic valve: Trivial regurgitation. - Mitral valve: Mild regurgitation. - Left atrium: The atrium was moderately dilated. No   evidence of thrombus in the atrial cavity. There was a   thrombus. Cannot exclude thrombus in the appendage. There   was spontaneous echo contrast ("smoke"). - Right atrium: No evidence of thrombus in the atrial cavity   or appendage.   ASSESSMENT AND PLAN:  1.  Persistent atrial fibrillation: Status post ablation 06/06/2016 though unable to isolate the right upper pulmonary vein.  He has had multiple further episodes of atrial fibrillation.  He was on low-dose propafenone but the cost went up.  CHA2DS2-VASc of 2 on Xarelto 20 mg daily.  He has had no further episodes of atrial fibrillation feeling quite uncomfortable.  He would  prefer a rhythm control strategy.  Due to that, we Pierre Dellarocco plan for ablation.  He is feeling quite poorly in atrial fibrillation.  We Zhi Geier plan for cardioversion prior to ablation.  We Alysiah Suppa also start him on flecainide 100 mg twice daily for rhythm control.  Risk, benefits, and alternatives to EP study and radiofrequency ablation for afib were also discussed in detail today. These risks include but are not limited to stroke, bleeding, vascular damage, tamponade, perforation, damage to the esophagus, lungs, and other structures, pulmonary vein stenosis, worsening renal function, and death. The patient understands these risk and wishes to proceed.  We Latika Kronick therefore proceed with catheter ablation at the next available time.  [facility], ICE, anesthesia are requested for the procedure.  Milas Schappell also obtain CT PV protocol prior to the procedure to exclude LAA thrombus and further evaluate atrial anatomy.   2.  Obstructive sleep apnea: CPAP compliance encouraged  3.   Hypertension: Currently well controlled  Current medicines are reviewed at length with the patient today.   The patient does not have concerns regarding his medicines.  The following changes were made today: None  Labs/ tests ordered today include:  Orders Placed This Encounter  Procedures   CT CARDIAC MORPH/PULM VEIN W/CM&W/O CA SCORE   Basic metabolic panel   CBC   Lipid Profile   PSA   Basic metabolic panel   CBC   EKG 12-Lead      Disposition:   FU with Caidyn Henricksen 3 months  Signed, Joda Braatz Adrian Leeds, MD  04/30/2021 9:19 AM     [facility] [address] [place] [address] [phone] (office) [phone] (fax)

## 2021-04-30 NOTE — Patient Instructions (Signed)
Medication Instructions:  Your physician has recommended you make the following change in your medication:  START Flecainide 100 mg twice a day  *If you need a refill on your cardiac medications before your next appointment, please call your pharmacy*   Lab Work: Pre procedure labs today:  BMP, CBC, fasting Lipids & PSA  Stop by [facility] the week of Thanksgiving for more blood work:  BMET & CBC  If you have labs (blood work) drawn today and your tests are completely normal, you will receive your results only by: [place] (if you have MyChart) OR A paper copy in the mail If you have any lab test that is abnormal or we need to change your treatment, we will call you to review the results.   Testing/Procedures: Your physician has recommended that you have a Cardioversion (DCCV). Electrical Cardioversion uses a jolt of electricity to your heart either through paddles or wired patches attached to your chest. This is a controlled, usually prescheduled, procedure. Defibrillation is done under light anesthesia in the hospital, and you usually go home the day of the procedure. This is done to get your heart back into a normal rhythm. You are not awake for the procedure. Please follow instruction below located under "other instructions".  Your physician has requested that you have cardiac CT within 7 days PRIOR to your ablation. Cardiac computed tomography (CT) is a painless test that uses an x-ray machine to take clear, detailed pictures of your heart.  Please follow instruction below located under "other instructions". You will get a call from our office to schedule the date for this test.  Your physician has recommended that you have an ablation. Catheter ablation is a medical procedure used to treat some cardiac arrhythmias (irregular heartbeats). During catheter ablation, a long, thin, flexible tube is put into a blood vessel in your groin (upper thigh), or neck. This tube is called an  ablation catheter. It is then guided to your heart through the blood vessel. Radio frequency waves destroy small areas of heart tissue where abnormal heartbeats may cause an arrhythmia to start. Please follow instruction below located under "other instructions".   Follow-Up: At [facility], you and your health needs are our priority.  As part of our continuing mission to provide you with exceptional heart care, we have created designated Provider Care Teams.  These Care Teams include your primary Cardiologist (physician) and Advanced Practice Providers (APPs -  Physician Assistants and Nurse Practitioners) who all work together to provide you with the care you need, when you need it.  Your next appointment:   1 month(s) after your ablation  The format for your next appointment:   In Person  Provider:   AFib clinic   Thank you for choosing [facility]!!   Trinidad Curet, RN [phone]    Other Instructions   Cardioversion instructions  You are scheduled for a Cardioversion on 05/08/2021 with Dr. Oval Linsey.  Please arrive at the [facility] (Main Entrance A) at [facility]: [address] [place], [place] [address] at 11:30 am.  DIET: Nothing to eat or drink after midnight except a sip of water with medications (see medication instructions below)  FYI: For your safety, and to allow [place] to monitor your vital signs accurately during the surgery/procedure we request that   if you have artificial nails, gel coating, SNS etc. Please have those removed prior to your surgery/procedure. Not having the nail coverings /polish removed may result in cancellation  or delay of your surgery/procedure.   Medication Instructions: You may take your morning medications with sip of water.  Continue your anticoagulant: Xarelto You will need to continue your anticoagulant after your procedure until you are told by your provider that it is safe to stop   Labs: 04/30/21  You must  have a responsible person to drive you home and stay in the waiting area during your procedure. Failure to do so could result in cancellation.  Bring your insurance cards.  *Special Note: Every effort is made to have your procedure done on time. Occasionally there are emergencies that occur at the hospital that may cause delays. Please be patient if a delay does occur.     CT INSTRUCTIONS Your cardiac CT will be scheduled at:  [facility] [address] [place], [place] [address] [phone]  Please arrive at the [facility] main entrance of [facility] 30 minutes prior to test start time. Proceed to the [facility] Radiology Department (first floor) to check-in and test prep.   Please follow these instructions carefully (unless otherwise directed):  Hold all erectile dysfunction medications at least 3 days (72 hrs) prior to test.  On the Night Before the Test: Be sure to Drink plenty of water. Do not consume any caffeinated/decaffeinated beverages or chocolate 12 hours prior to your test. Do not take any antihistamines 12 hours prior to your test.   On the Day of the Test: Drink plenty of water until 1 hour prior to the test. Do not eat any food 4 hours prior to the test. You may take your regular medications prior to the test.  Take metoprolol (Lopressor) 100 mg two hours prior to test. HOLD Furosemide/Hydrochlorothiazide morning of the test.       After the Test: Drink plenty of water. After receiving IV contrast, you may experience a mild flushed feeling. This is normal. On occasion, you may experience a mild rash up to 24 hours after the test. This is not dangerous. If this occurs, you can take Benadryl 25 mg and increase your fluid intake. If you experience trouble breathing, this can be serious. If it is severe call 911 IMMEDIATELY. If it is mild, please call our office. If you take any of these medications: Glipizide/Metformin, Avandament,  Glucavance, please do not take 48 hours after completing test unless otherwise instructed.   Once we have confirmed authorization from your insurance company, we will call you to set up a date and time for your test. Based on how quickly your insurance processes prior authorizations requests, please allow up to 4 weeks to be contacted for scheduling your Cardiac CT appointment. Be advised that routine Cardiac CT appointments could be scheduled as many as 8 weeks after your provider has ordered it.  For non-scheduling related questions, please contact the cardiac imaging nurse navigator should you have any questions/concerns: Marchia Bond, Cardiac Imaging Nurse Navigator Gordy Clement, Cardiac Imaging Nurse Navigator [facility] Direct Office Dial: [phone]   For scheduling needs, including cancellations and rescheduling, please call [place], [phone].       Electrophysiology/Ablation Procedure Instructions   You are scheduled for a(n)  ablation on 06/03/21 with Dr. Allegra Lai.   1.   Pre procedure testing-             A.  LAB WORK ---  the week of Thanksgiving.  for your pre procedure blood work.  You do NOT need to be fasting.  You can stop by any [facility] for this blood work.   On the day of your procedure 06/03/2021 you will go to [facility] [phone]) at 8:30 am.  Dennis Bast will go to the main entrance A [employer]) and enter where the [employer] are.  Your driver will drop you off and you will head down the hallway to ADMITTING.  You may have one support person come in to the hospital with you.  They will be asked to wait in the waiting room. It is OK to have someone drop you off and come back when you are ready to be discharged.   3.   Do not eat or drink after midnight prior to your procedure.   4.   On the morning of your procedure do NOT take any medication. Do not miss any doses of your blood thinner prior to the  morning of your procedure or your procedure will need to be rescheduled.   5.  Plan for an overnight stay but you may be discharged after your procedure, if you use your phone frequently bring your phone charger. If you are discharged after your procedure you will need someone to drive you home and be with you for 24 hours after your procedure.   6. You will follow up with the AFIB clinic 4 weeks after your procedure.  You will follow up with Dr. Curt Bears  3 months after your procedure.  These appointments will be made for you.   7. FYI: For your safety, and to allow [place] to monitor your vital signs accurately during the surgery/procedure we request that if you have artificial nails, gel coating, SNS etc. Please have those removed prior to your surgery/procedure. Not having the nail coverings /polish removed may result in cancellation or delay of your surgery/procedure.  * If you have ANY questions please call the office [phone] and ask for Jailen Coward RN or send me a MyChart message   * Occasionally, EP Studies and ablations can become lengthy.  Please make your family aware of this before your procedure starts.  Average time ranges from 2-8 hours for EP studies/ablations.  Your physician will call your family after the procedure with the results.

## 2021-05-01 NOTE — Progress Notes (Signed)
Attempted to obtain medical history via telephone, unable to reach at this time. I left a voicemail to return pre surgical testing department's phone call.  

## 2021-05-06 ENCOUNTER — Telehealth: Payer: Self-pay | Admitting: Cardiology

## 2021-05-06 NOTE — Telephone Encounter (Signed)
Returned pt call who reports he is back in sinus rhythm DCCV cancelled. Pt will let us know if he returns to afib. He appreciates my follow up and agreeable to plan.

## 2021-05-06 NOTE — Telephone Encounter (Signed)
Patient called stating he is not in A--Fib, we need to cancel cardioversion for Wednesday.  Please check MyChart, he sent message through there as well.

## 2021-05-07 ENCOUNTER — Telehealth: Payer: Self-pay

## 2021-05-07 LAB — CBC
Hematocrit: 49.1 % (ref 37.5–51.0)
Hemoglobin: 16.4 g/dL (ref 13.0–17.7)
MCH: 28.6 pg (ref 26.6–33.0)
MCHC: 33.4 g/dL (ref 31.5–35.7)
MCV: 86 fL (ref 79–97)
Platelets: 346 10*3/uL (ref 150–450)
RBC: 5.73 x10E6/uL (ref 4.14–5.80)
RDW: 12.3 % (ref 11.6–15.4)
WBC: 5.7 10*3/uL (ref 3.4–10.8)

## 2021-05-07 LAB — BASIC METABOLIC PANEL
BUN/Creatinine Ratio: 17 (ref 10–24)
BUN: 20 mg/dL (ref 8–27)
CO2: 24 mmol/L (ref 20–29)
Calcium: 9.4 mg/dL (ref 8.6–10.2)
Chloride: 101 mmol/L (ref 96–106)
Creatinine, Ser: 1.17 mg/dL (ref 0.76–1.27)
Glucose: 91 mg/dL (ref 70–99)
Potassium: 4.5 mmol/L (ref 3.5–5.2)
Sodium: 138 mmol/L (ref 134–144)
eGFR: 69 mL/min/{1.73_m2} (ref >59)

## 2021-05-07 LAB — LIPID PANEL
Chol/HDL Ratio: 3.4 ratio (ref 0.0–5.0)
Cholesterol, Total: 171 mg/dL (ref 100–199)
HDL: 51 mg/dL (ref >39)
LDL Chol Calc (NIH): 104 mg/dL — ABNORMAL HIGH (ref 0–99)
Triglycerides: 89 mg/dL (ref 0–149)
VLDL Cholesterol Cal: 16 mg/dL (ref 5–40)

## 2021-05-07 LAB — PSA: Prostate Specific Ag, Serum: 4.5 ng/mL — ABNORMAL HIGH (ref 0.0–4.0)

## 2021-05-07 NOTE — Telephone Encounter (Signed)
**Note De-Identified Adrian Clark Obfuscation** Per Joanne Chars at [employer] the pts Xarelto PA has been approved until 04/30/2022. Case ID: [id number]  [facility] mail pharmacy is aware of this approval.

## 2021-05-08 ENCOUNTER — Ambulatory Visit (HOSPITAL_COMMUNITY)
Admission: RE | Admit: 2021-05-08 | Payer: BC Managed Care – PPO | Source: Home / Self Care | Admitting: Cardiovascular Disease

## 2021-05-08 SURGERY — CARDIOVERSION
Anesthesia: General

## 2021-05-09 DIAGNOSIS — G4733 Obstructive sleep apnea (adult) (pediatric): Secondary | ICD-10-CM | POA: Diagnosis not present

## 2021-05-20 DIAGNOSIS — I4819 Other persistent atrial fibrillation: Secondary | ICD-10-CM | POA: Diagnosis not present

## 2021-05-20 DIAGNOSIS — Z01812 Encounter for preprocedural laboratory examination: Secondary | ICD-10-CM | POA: Diagnosis not present

## 2021-05-21 LAB — BASIC METABOLIC PANEL
BUN/Creatinine Ratio: 21 (ref 10–24)
BUN: 28 mg/dL — ABNORMAL HIGH (ref 8–27)
CO2: 25 mmol/L (ref 20–29)
Calcium: 8.9 mg/dL (ref 8.6–10.2)
Chloride: 102 mmol/L (ref 96–106)
Creatinine, Ser: 1.32 mg/dL — ABNORMAL HIGH (ref 0.76–1.27)
Glucose: 93 mg/dL (ref 70–99)
Potassium: 4.4 mmol/L (ref 3.5–5.2)
Sodium: 140 mmol/L (ref 134–144)
eGFR: 59 mL/min/{1.73_m2} — ABNORMAL LOW (ref >59)

## 2021-05-21 LAB — CBC
Hematocrit: 45.3 % (ref 37.5–51.0)
Hemoglobin: 15.2 g/dL (ref 13.0–17.7)
MCH: 29 pg (ref 26.6–33.0)
MCHC: 33.6 g/dL (ref 31.5–35.7)
MCV: 86 fL (ref 79–97)
Platelets: 310 10*3/uL (ref 150–450)
RBC: 5.25 x10E6/uL (ref 4.14–5.80)
RDW: 12.4 % (ref 11.6–15.4)
WBC: 6.8 10*3/uL (ref 3.4–10.8)

## 2021-05-24 ENCOUNTER — Telehealth (HOSPITAL_COMMUNITY): Payer: Self-pay | Admitting: Emergency Medicine

## 2021-05-24 NOTE — Telephone Encounter (Signed)
Reaching out to patient to offer assistance regarding upcoming cardiac imaging study; pt verbalizes understanding of appt date/time, parking situation and where to check in, pre-test NPO status and medications ordered, and verified current allergies; name and call back number provided for further questions should they arise Krisanne Lich RN Navigator Cardiac Imaging [facility] [phone] office [phone] cell   Denies iv issues Arrival 730  

## 2021-05-24 NOTE — Telephone Encounter (Signed)
Attempted to call patient regarding upcoming cardiac CT appointment. °Left message on voicemail with name and callback number °Ilah Boule RN Navigator Cardiac Imaging °[facility] °[phone] Office °[phone] Cell ° °

## 2021-05-27 ENCOUNTER — Ambulatory Visit (HOSPITAL_COMMUNITY)
Admission: RE | Admit: 2021-05-27 | Discharge: 2021-05-27 | Disposition: A | Discharge status: Home / Self Care | Payer: BC Managed Care – PPO | Source: Ambulatory Visit | Attending: Cardiology | Admitting: Cardiology

## 2021-05-27 ENCOUNTER — Other Ambulatory Visit: Payer: Self-pay

## 2021-05-27 DIAGNOSIS — I4819 Other persistent atrial fibrillation: Secondary | ICD-10-CM

## 2021-05-27 MED ORDER — IOHEXOL 350 MG/ML SOLN
160.0000 mL | Freq: Once | INTRAVENOUS | Status: AC | PRN
  Administered 2021-05-27: 08:00:00 160 mL via INTRAVENOUS

## 2021-05-31 NOTE — Pre-Procedure Instructions (Signed)
Attempted to call patient regarding procedure instructions.  Left voicemail on the following items: Arrival time 0830 Nothing to eat or drink after midnight No meds AM of procedure Responsible person to drive you home and stay with you for 24 hrs  Have you missed any doses of anti-coagulant Xarelto- take dose Today, Saturday and Sunday.   No medications on Monday morning

## 2021-06-03 ENCOUNTER — Encounter (HOSPITAL_COMMUNITY): Payer: Self-pay | Admitting: Cardiology

## 2021-06-03 ENCOUNTER — Ambulatory Visit (HOSPITAL_COMMUNITY)
Admission: RE | Admit: 2021-06-03 | Discharge: 2021-06-03 | Disposition: A | Discharge status: Home / Self Care | Payer: BC Managed Care – PPO | Attending: Cardiology | Admitting: Cardiology

## 2021-06-03 ENCOUNTER — Ambulatory Visit (HOSPITAL_COMMUNITY): Payer: BC Managed Care – PPO | Admitting: Anesthesiology

## 2021-06-03 ENCOUNTER — Encounter (HOSPITAL_COMMUNITY)
Admission: RE | Disposition: A | Discharge status: Home / Self Care | Payer: Self-pay | Source: Home / Self Care | Attending: Cardiology

## 2021-06-03 ENCOUNTER — Other Ambulatory Visit: Payer: Self-pay

## 2021-06-03 DIAGNOSIS — I4819 Other persistent atrial fibrillation: Secondary | ICD-10-CM | POA: Insufficient documentation

## 2021-06-03 DIAGNOSIS — Z7901 Long term (current) use of anticoagulants: Secondary | ICD-10-CM | POA: Diagnosis not present

## 2021-06-03 DIAGNOSIS — I10 Essential (primary) hypertension: Secondary | ICD-10-CM | POA: Insufficient documentation

## 2021-06-03 DIAGNOSIS — G4733 Obstructive sleep apnea (adult) (pediatric): Secondary | ICD-10-CM | POA: Diagnosis not present

## 2021-06-03 DIAGNOSIS — I480 Paroxysmal atrial fibrillation: Secondary | ICD-10-CM | POA: Diagnosis present

## 2021-06-03 DIAGNOSIS — Z9989 Dependence on other enabling machines and devices: Secondary | ICD-10-CM | POA: Diagnosis not present

## 2021-06-03 HISTORY — PX: ATRIAL FIBRILLATION ABLATION: EP1191

## 2021-06-03 LAB — POCT ACTIVATED CLOTTING TIME
Activated Clotting Time: 283 seconds
Activated Clotting Time: 306 seconds

## 2021-06-03 SURGERY — ATRIAL FIBRILLATION ABLATION
Anesthesia: General

## 2021-06-03 MED ORDER — EPHEDRINE SULFATE-NACL 50-0.9 MG/10ML-% IV SOSY
PREFILLED_SYRINGE | INTRAVENOUS | Status: DC | PRN
  Administered 2021-06-03: 5 mg via INTRAVENOUS

## 2021-06-03 MED ORDER — HEPARIN (PORCINE) IN NACL 1000-0.9 UT/500ML-% IV SOLN
INTRAVENOUS | Status: AC
  Filled 2021-06-03: qty 500

## 2021-06-03 MED ORDER — HEPARIN SODIUM (PORCINE) 1000 UNIT/ML IJ SOLN
INTRAMUSCULAR | Status: DC | PRN
  Administered 2021-06-03: 15000 [IU] via INTRAVENOUS
  Administered 2021-06-03: 3000 [IU] via INTRAVENOUS
  Administered 2021-06-03: 5000 [IU] via INTRAVENOUS

## 2021-06-03 MED ORDER — ONDANSETRON HCL 4 MG/2ML IJ SOLN
INTRAMUSCULAR | Status: DC | PRN
  Administered 2021-06-03: 4 mg via INTRAVENOUS

## 2021-06-03 MED ORDER — FENTANYL CITRATE (PF) 250 MCG/5ML IJ SOLN
INTRAMUSCULAR | Status: DC | PRN
  Administered 2021-06-03: 100 ug via INTRAVENOUS

## 2021-06-03 MED ORDER — SODIUM CHLORIDE 0.9% FLUSH: 3.0000 mL | Freq: Two times a day (BID) | INTRAVENOUS | Status: DC

## 2021-06-03 MED ORDER — PROPOFOL 10 MG/ML IV BOLUS
INTRAVENOUS | Status: DC | PRN
  Administered 2021-06-03: 200 mg via INTRAVENOUS

## 2021-06-03 MED ORDER — PHENYLEPHRINE HCL-NACL 20-0.9 MG/250ML-% IV SOLN
INTRAVENOUS | Status: DC | PRN
  Administered 2021-06-03: 40 ug/min via INTRAVENOUS

## 2021-06-03 MED ORDER — HEPARIN (PORCINE) IN NACL 1000-0.9 UT/500ML-% IV SOLN
INTRAVENOUS | Status: DC | PRN
  Administered 2021-06-03 (×4): 500 mL

## 2021-06-03 MED ORDER — SODIUM CHLORIDE 0.9 % IV SOLN: 250.0000 mL | INTRAVENOUS | Status: DC | PRN

## 2021-06-03 MED ORDER — DEXAMETHASONE SODIUM PHOSPHATE 10 MG/ML IJ SOLN
INTRAMUSCULAR | Status: DC | PRN
  Administered 2021-06-03: 10 mg via INTRAVENOUS

## 2021-06-03 MED ORDER — DOBUTAMINE INFUSION FOR EP/ECHO/NUC (1000 MCG/ML)
INTRAVENOUS | Status: DC | PRN
  Administered 2021-06-03: 20 ug/kg/min via INTRAVENOUS

## 2021-06-03 MED ORDER — ONDANSETRON HCL 4 MG/2ML IJ SOLN: 4.0000 mg | Freq: Four times a day (QID) | INTRAMUSCULAR | Status: DC | PRN

## 2021-06-03 MED ORDER — PROTAMINE SULFATE 10 MG/ML IV SOLN
INTRAVENOUS | Status: DC | PRN
  Administered 2021-06-03: 40 mg via INTRAVENOUS

## 2021-06-03 MED ORDER — HEPARIN SODIUM (PORCINE) 1000 UNIT/ML IJ SOLN
INTRAMUSCULAR | Status: AC
  Filled 2021-06-03: qty 10

## 2021-06-03 MED ORDER — LIDOCAINE 2% (20 MG/ML) 5 ML SYRINGE
INTRAMUSCULAR | Status: DC | PRN
  Administered 2021-06-03: 60 mg via INTRAVENOUS

## 2021-06-03 MED ORDER — ROCURONIUM BROMIDE 10 MG/ML (PF) SYRINGE
PREFILLED_SYRINGE | INTRAVENOUS | Status: DC | PRN
  Administered 2021-06-03: 60 mg via INTRAVENOUS

## 2021-06-03 MED ORDER — DOBUTAMINE INFUSION FOR EP/ECHO/NUC (1000 MCG/ML)
INTRAVENOUS | Status: AC
  Filled 2021-06-03: qty 250

## 2021-06-03 MED ORDER — SODIUM CHLORIDE 0.9 % IV SOLN: INTRAVENOUS | Status: DC

## 2021-06-03 MED ORDER — HEPARIN SODIUM (PORCINE) 1000 UNIT/ML IJ SOLN
INTRAMUSCULAR | Status: DC | PRN
  Administered 2021-06-03: 1000 [IU] via INTRAVENOUS

## 2021-06-03 MED ORDER — SODIUM CHLORIDE 0.9% FLUSH: 3.0000 mL | INTRAVENOUS | Status: DC | PRN

## 2021-06-03 MED ORDER — ACETAMINOPHEN 325 MG PO TABS
650.0000 mg | ORAL_TABLET | ORAL | Status: DC | PRN
  Filled 2021-06-03: qty 2

## 2021-06-03 MED ORDER — SUGAMMADEX SODIUM 200 MG/2ML IV SOLN
INTRAVENOUS | Status: DC | PRN
  Administered 2021-06-03: 200 mg via INTRAVENOUS

## 2021-06-03 MED ORDER — MIDAZOLAM HCL 2 MG/2ML IJ SOLN
INTRAMUSCULAR | Status: DC | PRN
Start: 2021-06-03 — End: 2021-06-03
  Administered 2021-06-03 (×2): 1 mg via INTRAVENOUS

## 2021-06-03 SURGICAL SUPPLY — 19 items
BAG SNAP BAND KOVER 36X36 (MISCELLANEOUS) ×1 IMPLANT
CATH OCTARAY 2.0 F 3-3-3-3-3 (CATHETERS) ×1 IMPLANT
CATH S CIRCA THERM PROBE 10F (CATHETERS) ×1 IMPLANT
CATH SMTCH THERMOCOOL SF DF (CATHETERS) ×1 IMPLANT
CATH SOUNDSTAR ECO 8FR (CATHETERS) ×1 IMPLANT
CATH WEBSTER BI DIR CS D-F CRV (CATHETERS) ×1 IMPLANT
CLOSURE PERCLOSE PROSTYLE (VASCULAR PRODUCTS) ×4 IMPLANT
COVER SWIFTLINK CONNECTOR (BAG) ×2 IMPLANT
KIT VERSACROSS STEERABLE D1 (CATHETERS) ×1 IMPLANT
MAT PREVALON FULL STRYKER (MISCELLANEOUS) ×1 IMPLANT
PACK EP LATEX FREE (CUSTOM PROCEDURE TRAY) ×2
PACK EP LF (CUSTOM PROCEDURE TRAY) ×1 IMPLANT
PAD DEFIB RADIO PHYSIO CONN (PAD) ×2 IMPLANT
SHEATH CARTO VIZIGO SM CVD (SHEATH) ×1 IMPLANT
SHEATH PINNACLE 7F 10CM (SHEATH) ×1 IMPLANT
SHEATH PINNACLE 8F 10CM (SHEATH) ×2 IMPLANT
SHEATH PINNACLE VASC 9FR (SHEATH) ×1 IMPLANT
SHEATH PROBE COVER 6X72 (BAG) ×1 IMPLANT
TUBING SMART ABLATE COOLFLOW (TUBING) ×1 IMPLANT

## 2021-06-03 NOTE — Anesthesia Postprocedure Evaluation (Signed)
Anesthesia Post Note  Patient: Adrian Clark  Procedure(s) Performed: ATRIAL FIBRILLATION ABLATION     Patient location during evaluation: PACU Anesthesia Type: General Level of consciousness: awake and alert, oriented and patient cooperative Pain management: pain level controlled Vital Signs Assessment: post-procedure vital signs reviewed and stable Respiratory status: spontaneous breathing, nonlabored ventilation and respiratory function stable Cardiovascular status: blood pressure returned to baseline and stable Postop Assessment: no apparent nausea or vomiting Anesthetic complications: no   There were no known notable events for this encounter.  Last Vitals:  Vitals:   06/03/21 1408 06/03/21 1410  BP: 136/81 126/82  Pulse: 69 69  Resp: 15 14  Temp: 37.2 C   SpO2: 97% 95%    Last Pain:  Vitals:   06/03/21 1355  TempSrc:   PainSc: 0-No pain                 Pervis Hocking

## 2021-06-03 NOTE — Discharge Instructions (Addendum)
Post procedure care instructions No driving for 4 days. No lifting over 5 lbs for 1 week. No vigorous or sexual activity for 1 week. You may return to work/your usual activities on 12/13/2. Keep procedure site clean & dry. If you notice increased pain, swelling, bleeding or pus, call/return!  You may shower after 24 hours, but no soaking in baths/hot tubs/pools for 1 week.    You have an appointment set up with the [facility].  Multiple studies have shown that being followed by a dedicated atrial fibrillation clinic in addition to the standard care you receive from your other physicians improves health. We believe that enrollment in the atrial fibrillation clinic will allow [place] to better care for you.   The phone number to the [facility] is [phone]. The clinic is staffed Monday through Friday from 8:30am to 5pm.  Parking Directions: The clinic is located in the Heart and Vascular Building connected to [facility]. 1)From [address] turn on to [address] and go to the 3rd entrance  (Heart and Vascular entrance) on the right. 2)Look to the right for Heart &Vascular Parking Garage. 3)A code for the entrance is required For Jan is 1202.   4)Take the elevators to the 1st floor. Registration is in the room with the glass walls at the end of the hallway.  If you have any trouble parking or locating the clinic, please don't hesitate to call [phone].   Cardiac Ablation, Care After  This sheet gives you information about how to care for yourself after your procedure. Your health care provider may also give you more specific instructions. If you have problems or questions, contact your health care provider. What can I expect after the procedure? After the procedure, it is common to have: Bruising around your puncture site. Tenderness around your puncture site. Skipped heartbeats. Tiredness (fatigue).  Follow these instructions at  home: Puncture site care  Follow instructions from your health care provider about how to take care of your puncture site. Make sure you: If present, leave stitches (sutures), skin glue, or adhesive strips in place. These skin closures may need to stay in place for up to 2 weeks. If adhesive strip edges start to loosen and curl up, you may trim the loose edges. Do not remove adhesive strips completely unless your health care provider tells you to do that. If a large square bandage is present, this may be removed 24 hours after surgery.  Check your puncture site every day for signs of infection. Check for: Redness, swelling, or pain. Fluid or blood. If your puncture site starts to bleed, lie down on your back, apply firm pressure to the area, and contact your health care provider. Warmth. Pus or a bad smell. Driving Do not drive for at least 4 days after your procedure or however long your health care provider recommends. (Do not resume driving if you have previously been instructed not to drive for other health reasons.) Do not drive or use heavy machinery while taking prescription pain medicine. Activity Avoid activities that take a lot of effort for at least 7 days after your procedure. Do not lift anything that is heavier than 5 lb (4.5 kg) for one week.  No sexual activity for 1 week.  Return to your normal activities as told by your health care provider. Ask your health care provider what activities are safe for you. General instructions Take over-the-counter and prescription medicines only as told by your health care  provider. Do not use any products that contain nicotine or tobacco, such as cigarettes and e-cigarettes. If you need help quitting, ask your health care provider. You may shower after 24 hours, but Do not take baths, swim, or use a hot tub for 1 week.  Do not drink alcohol for 24 hours after your procedure. Keep all follow-up visits as told by your health care provider. This  is important. Contact a health care provider if: You have redness, mild swelling, or pain around your puncture site. You have fluid or blood coming from your puncture site that stops after applying firm pressure to the area. Your puncture site feels warm to the touch. You have pus or a bad smell coming from your puncture site. You have a fever. You have chest pain or discomfort that spreads to your neck, jaw, or arm. You are sweating a lot. You feel nauseous. You have a fast or irregular heartbeat. You have shortness of breath. You are dizzy or light-headed and feel the need to lie down. You have pain or numbness in the arm or leg closest to your puncture site. Get help right away if: Your puncture site suddenly swells. Your puncture site is bleeding and the bleeding does not stop after applying firm pressure to the area. These symptoms may represent a serious problem that is an emergency. Do not wait to see if the symptoms will go away. Get medical help right away. Call your local emergency services (911 in the [place] Do not drive yourself to the hospital. Summary After the procedure, it is normal to have bruising and tenderness at the puncture site in your groin, neck, or forearm. Check your puncture site every day for signs of infection. Get help right away if your puncture site is bleeding and the bleeding does not stop after applying firm pressure to the area. This is a medical emergency. This information is not intended to replace advice given to you by your health care provider. Make sure you discuss any questions you have with your health care provider.

## 2021-06-03 NOTE — Transfer of Care (Signed)
Immediate Anesthesia Transfer of Care Note  Patient: JORDELL OUTTEN  Procedure(s) Performed: ATRIAL FIBRILLATION ABLATION  Patient Location: Cath Lab  Anesthesia Type:General  Level of Consciousness: awake and alert   Airway & Oxygen Therapy: Patient Spontanous Breathing and Patient connected to nasal cannula oxygen  Post-op Assessment: Report given to RN and Post -op Vital signs reviewed and stable  Post vital signs: Reviewed and stable  Last Vitals:  Vitals Value Taken Time  BP 136/81 06/03/21 1405  Temp    Pulse 70 06/03/21 1407  Resp 13 06/03/21 1407  SpO2 93 % 06/03/21 1407  Vitals shown include unvalidated device data.  Last Pain:  Vitals:   06/03/21 0952  TempSrc:   PainSc: 0-No pain         Complications: There were no known notable events for this encounter.

## 2021-06-03 NOTE — Anesthesia Preprocedure Evaluation (Addendum)
Anesthesia Evaluation  Patient identified by MRN, date of birth, ID band Patient awake    Reviewed: Allergy & Precautions, NPO status , Patient's Chart, lab work & pertinent test results, reviewed documented beta blocker date and time   Airway Mallampati: III  TM Distance: >3 FB Neck ROM: Full    Dental  (+) Teeth Intact, Dental Advisory Given   Pulmonary sleep apnea and Continuous Positive Airway Pressure Ventilation ,    Pulmonary exam normal breath sounds clear to auscultation       Cardiovascular hypertension, Pt. on medications and Pt. on home beta blockers Normal cardiovascular exam+ dysrhythmias (xarelto- LD last night) Atrial Fibrillation  Rhythm:Regular Rate:Normal     Neuro/Psych negative neurological ROS  negative psych ROS   GI/Hepatic negative GI ROS, Neg liver ROS,   Endo/Other  negative endocrine ROS  Renal/GU Renal InsufficiencyRenal diseaseCr 1.32  negative genitourinary   Musculoskeletal negative musculoskeletal ROS (+)   Abdominal   Peds  Hematology negative hematology ROS (+)   Anesthesia Other Findings   Reproductive/Obstetrics negative OB ROS                            Anesthesia Physical Anesthesia Plan  ASA: 2  Anesthesia Plan: General   Post-op Pain Management:    Induction: Intravenous  PONV Risk Score and Plan: Ondansetron, Dexamethasone, Treatment may vary due to age or medical condition and Midazolam  Airway Management Planned: Oral ETT  Additional Equipment: None  Intra-op Plan:   Post-operative Plan: Extubation in OR  Informed Consent: I have reviewed the patients History and Physical, chart, labs and discussed the procedure including the risks, benefits and alternatives for the proposed anesthesia with the patient or authorized representative who has indicated his/her understanding and acceptance.     Dental advisory given  Plan Discussed  with: CRNA  Anesthesia Plan Comments:        Anesthesia Quick Evaluation

## 2021-06-03 NOTE — Anesthesia Procedure Notes (Addendum)
Procedure Name: Intubation Date/Time: 06/03/2021 11:59 AM Performed by: Erick Colace, CRNA Pre-anesthesia Checklist: Patient identified, Emergency Drugs available, Suction available and Patient being monitored Patient Re-evaluated:Patient Re-evaluated prior to induction Oxygen Delivery Method: Circle System Utilized Preoxygenation: Pre-oxygenation with 100% oxygen Induction Type: IV induction Ventilation: Mask ventilation without difficulty, Oral airway inserted - appropriate to patient size and Two handed mask ventilation required Laryngoscope Size: Glidescope and 4 Grade View: Grade I Tube type: Oral Tube size: 7.5 mm Number of attempts: 1 Airway Equipment and Method: Stylet and Oral airway Placement Confirmation: ETT inserted through vocal cords under direct vision, positive ETCO2 and breath sounds checked- equal and bilateral Secured at: 23 cm Tube secured with: Tape Dental Injury: Teeth and Oropharynx as per pre-operative assessment

## 2021-06-03 NOTE — H&P (Signed)
Electrophysiology Office Note   Date:  06/03/2021   ID:  ARBER WIEMERS, DOB Dec 19, 1955, MRN [id number]  PCP:  [name], MD  Cardiologist:  [name] Primary Electrophysiologist:  Cleta Heatley Meredith Leeds, MD    No chief complaint on file.    History of Present Illness: Adrian Clark is a 65 y.o. male who presents today for electrophysiology evaluation.     He has a history significant for paroxysmal atrial fibrillation status post ablation in 2013 at [facility] in [place], [place], hypertension.  He was admitted January 2015 with atrial fibrillation rapid rates.  He had a left atrial appendage thrombus on TEE.  He was eventually cardioverted back to sinus rhythm and placed on Xarelto.  He underwent repeat ablation 06/06/2016 but unfortunately his right upper pulmonary vein was not isolated.  He was initially on propafenone but this was stopped due to increased costs.  Today, denies symptoms of palpitations, chest pain, shortness of breath, orthopnea, PND, lower extremity edema, claudication, dizziness, presyncope, syncope, bleeding, or neurologic sequela. The patient is tolerating medications without difficulties. Ablation today.   Past Medical History:  Diagnosis Date   HTN (hypertension)    OSA (obstructive sleep apnea)    PAF (paroxysmal atrial fibrillation) ([place]) 2013   s/p ablation    Past Surgical History:  Procedure Laterality Date   ABLATION     AFIB   CARDIOVERSION N/A 07/11/2013   Procedure: CARDIOVERSION;  Surgeon: [name], MD;  Location: [place];  Service: Cardiovascular;  Laterality: N/A;   CARDIOVERSION N/A 08/20/2015   Procedure: CARDIOVERSION;  Surgeon: [name], MD;  Location: [place];  Service: Cardiovascular;  Laterality: N/A;   CARDIOVERSION N/A 06/20/2016   Procedure: CARDIOVERSION;  Surgeon: [name], MD;  Location: [place];  Service: Cardiovascular;  Laterality: N/A;   CARDIOVERSION N/A 08/02/2018   Procedure:  CARDIOVERSION;  Surgeon: [name], MD;  Location: [facility] ENDOSCOPY;  Service: Cardiovascular;  Laterality: N/A;   CARDIOVERSION N/A 02/25/2019   Procedure: CARDIOVERSION;  Surgeon: Lelon Perla, MD;  Location: [place] ENDOSCOPY;  Service: Cardiovascular;  Laterality: N/A;   CARDIOVERSION N/A 05/23/2019   Procedure: CARDIOVERSION;  Surgeon: Acie Fredrickson Wonda Cheng, MD;  Location: [facility] ENDOSCOPY;  Service: Cardiovascular;  Laterality: N/A;   CHOLECYSTECTOMY     COLONOSCOPY N/A 02/06/2017   Procedure: COLONOSCOPY;  Surgeon: [name], MD;  Location: [facility] ENDOSCOPY;  Service: Endoscopy;  Laterality: N/A;   ELECTROPHYSIOLOGIC STUDY N/A 06/06/2016   Procedure: Atrial Fibrillation Ablation;  Surgeon: Berlin Viereck Meredith Leeds, MD;  Location: [place] CV LAB;  Service: Cardiovascular;  Laterality: N/A;   TEE WITHOUT CARDIOVERSION N/A 07/11/2013   Procedure: TRANSESOPHAGEAL ECHOCARDIOGRAM (TEE);  Surgeon: [name], MD;  Location: [facility] ENDOSCOPY;  Service: Cardiovascular;  Laterality: N/A;   TONSILLECTOMY     VASECTOMY       Current Facility-Administered Medications  Medication Dose Route Frequency Provider Last Rate Last Admin   0.9 %  sodium chloride infusion   Intravenous Continuous Constance Haw, MD 50 mL/hr at 06/03/21 0956 New Bag at 06/03/21 0956    Allergies:   Poison ivy extract   Social History:  The patient  reports that he has never smoked. He has never used smokeless tobacco. He reports current alcohol use of about 2.0 standard drinks per week. He reports that he does not use drugs.   Family History:  The patient's family history includes Prostate cancer in his father; Uterine cancer in his mother.  ROS:  Please see the history of present illness.   Otherwise, review of systems is positive for none.   All other systems are reviewed and negative.   PHYSICAL EXAM: VS:  BP (!) 161/98   Pulse (!) 56   Temp 98.2 F (36.8 C) (Oral)   Resp 18   Ht 6\' 3"  (1.905 m)   Wt 117.5 kg    SpO2 91%   BMI 32.37 kg/m  , BMI Body mass index is 32.37 kg/m. GEN: Well nourished, well developed, in no acute distress  HEENT: normal  Neck: no JVD, carotid bruits, or masses Cardiac: RRR; no murmurs, rubs, or gallops,no edema  Respiratory:  clear to auscultation bilaterally, normal work of breathing GI: soft, nontender, nondistended, + BS MS: no deformity or atrophy  Skin: warm and dry Neuro:  Strength and sensation are intact Psych: euthymic mood, full affect  Recent Labs: 05/20/2021: BUN 28; Creatinine, Ser 1.32; Hemoglobin 15.2; Platelets 310; Potassium 4.4; Sodium 140    Lipid Panel     Component Value Date/Time   CHOL 171 04/30/2021 0925   TRIG 89 04/30/2021 0925   HDL 51 04/30/2021 0925   CHOLHDL 3.4 04/30/2021 0925   CHOLHDL 3.3 05/27/2016 0844   VLDL 21 05/27/2016 0844   LDLCALC 104 (H) 04/30/2021 0925     Wt Readings from Last 3 Encounters:  06/03/21 117.5 kg  04/30/21 116.1 kg  03/08/21 118.1 kg      Other studies Reviewed: Additional studies/ records that were reviewed today include: TEE 07/11/13  Review of the above records today demonstrates:  - Left ventricle: Systolic function was normal. The   estimated ejection fraction was in the range of 55% to   60%. Wall motion was normal; there were no regional wall   motion abnormalities. - Aortic valve: Trivial regurgitation. - Mitral valve: Mild regurgitation. - Left atrium: The atrium was moderately dilated. No   evidence of thrombus in the atrial cavity. There was a   thrombus. Cannot exclude thrombus in the appendage. There   was spontaneous echo contrast ("smoke"). - Right atrium: No evidence of thrombus in the atrial cavity   or appendage.   ASSESSMENT AND PLAN:  1.  Persistent atrial fibrillation: JAHIEM FRANZONI has presented today for surgery, with the diagnosis of AF.  The various methods of treatment have been discussed with the patient and family. After consideration of risks, benefits  and other options for treatment, the patient has consented to  Procedure(s): Catheter ablation as a surgical intervention .  Risks include but not limited to complete heart block, stroke, esophageal damage, nerve damage, bleeding, vascular damage, tamponade, perforation, MI, and death. The patient's history has been reviewed, patient examined, no change in status, stable for surgery.  I have reviewed the patient's chart and labs.  Questions were answered to the patient's satisfaction.    Deylan Canterbury Curt Bears, MD 06/03/2021 11:05 AM      Disposition:   FU with Yvonna Brun 3 months  Signed, Phill Steck Meredith Leeds, MD  06/03/2021 11:04 AM     [facility] [address] [place] [address] [phone] (office) [phone] (fax)

## 2021-06-04 ENCOUNTER — Encounter (HOSPITAL_COMMUNITY): Payer: Self-pay | Admitting: Cardiology

## 2021-06-07 ENCOUNTER — Encounter: Payer: Self-pay | Admitting: Cardiology

## 2021-06-08 DIAGNOSIS — G4733 Obstructive sleep apnea (adult) (pediatric): Secondary | ICD-10-CM | POA: Diagnosis not present

## 2021-06-11 ENCOUNTER — Encounter (HOSPITAL_COMMUNITY): Payer: Self-pay

## 2021-06-13 ENCOUNTER — Other Ambulatory Visit: Payer: Self-pay | Admitting: Cardiology

## 2021-06-20 ENCOUNTER — Other Ambulatory Visit: Payer: Self-pay

## 2021-06-20 MED ORDER — FLECAINIDE ACETATE 100 MG PO TABS
100.0000 mg | ORAL_TABLET | Freq: Two times a day (BID) | ORAL | 3 refills | Status: DC
Start: 2021-06-20 — End: 2021-06-25

## 2021-06-25 ENCOUNTER — Other Ambulatory Visit: Payer: Self-pay | Admitting: *Deleted

## 2021-06-25 MED ORDER — FLECAINIDE ACETATE 100 MG PO TABS: 100.0000 mg | ORAL_TABLET | Freq: Two times a day (BID) | ORAL | 3 refills | Status: DC

## 2021-06-27 ENCOUNTER — Ambulatory Visit (HOSPITAL_COMMUNITY): Payer: BC Managed Care – PPO | Admitting: Nurse Practitioner

## 2021-06-27 ENCOUNTER — Encounter: Payer: Self-pay | Admitting: Cardiology

## 2021-07-09 DIAGNOSIS — G4733 Obstructive sleep apnea (adult) (pediatric): Secondary | ICD-10-CM | POA: Diagnosis not present

## 2021-08-06 NOTE — Progress Notes (Signed)
Virtual Visit via Video Note   This visit type was conducted due to national recommendations for restrictions regarding the COVID-19 Pandemic (e.g. social distancing) in an effort to limit this patient's exposure and mitigate transmission in our community.  Due to his co-morbid illnesses, this patient is at least at moderate risk for complications without adequate follow up.  This format is felt to be most appropriate for this patient at this time.  All issues noted in this document were discussed and addressed.  A limited physical exam was performed with this format.  Please refer to the patient's chart for his consent to telehealth for [facility].   Date:  08/07/2021   ID:  Adrian Clark, DOB 11/27/1955, MRN [id number] The patient was identified using 2 identifiers.  Patient Location: Home Provider Location: Home Office   PCP:  Corrington, Delsa Grana, MD   [facility] Providers Cardiologist:  Fransico Him, MD Electrophysiologist:  Will Meredith Leeds, MD     Evaluation Performed:  Follow-Up Visit  Chief Complaint:   OSA  History of Present Illness:    Adrian Clark is a 66 y.o. male with  a history of PAF s/p status post ablation in 2013 [facility] in [place], [place]), OSA, HTN.  Admitted in 1/15 with AF with RVR.  He had LA thrombus on TEE.  He eventually converted to NSR on his own.  Patient was back in atrial fibrillation 07/2015  CHADS2-VASc=1 (HTN).  He was placed on Xarelto 20 mg QD with plans to proceed with DCCV after 4 weeks of uninterrupted anticoagulation.   He underwent DCCV 08/20/15.   He is now followed by EP for his afib.  He has a history of OSA and is on CPAP.  At his office visit last appointment his device was over 59 years old and apartment broken also we ordered him a new device and he is now back for follow-up per insurance requirements.  He is doing well with his CPAP device and thinks that he has gotten used to it.  He tolerates the mask and feels the  pressure is adequate.  Since going on CPAP he feels rested in the am and has no significant daytime sleepiness.  He denies any significant mouth dryness.  He has had some problems with epistaxis.  He is not using the humidification consistently.   He does not think that he snores.     The patient does not have symptoms concerning for COVID-19 infection (fever, chills, cough, or new shortness of breath).    Past Medical History:  Diagnosis Date   HTN (hypertension)    OSA (obstructive sleep apnea)    PAF (paroxysmal atrial fibrillation) ([place]) 2013   s/p ablation    Past Surgical History:  Procedure Laterality Date   ABLATION     AFIB   ATRIAL FIBRILLATION ABLATION N/A 06/03/2021   Procedure: ATRIAL FIBRILLATION ABLATION;  Surgeon: Constance Haw, MD;  Location: [place] CV LAB;  Service: Cardiovascular;  Laterality: N/A;   CARDIOVERSION N/A 07/11/2013   Procedure: CARDIOVERSION;  Surgeon: [name], MD;  Location: [place];  Service: Cardiovascular;  Laterality: N/A;   CARDIOVERSION N/A 08/20/2015   Procedure: CARDIOVERSION;  Surgeon: [name], MD;  Location: [place] ENDOSCOPY;  Service: Cardiovascular;  Laterality: N/A;   CARDIOVERSION N/A 06/20/2016   Procedure: CARDIOVERSION;  Surgeon: [name], MD;  Location: [place];  Service: Cardiovascular;  Laterality: N/A;   CARDIOVERSION N/A 08/02/2018   Procedure:  CARDIOVERSION;  Surgeon: [name], MD;  Location: [facility] ENDOSCOPY;  Service: Cardiovascular;  Laterality: N/A;   CARDIOVERSION N/A 02/25/2019   Procedure: CARDIOVERSION;  Surgeon: Lelon Perla, MD;  Location: [facility] ENDOSCOPY;  Service: Cardiovascular;  Laterality: N/A;   CARDIOVERSION N/A 05/23/2019   Procedure: CARDIOVERSION;  Surgeon: Thayer Headings, MD;  Location: [facility] ENDOSCOPY;  Service: Cardiovascular;  Laterality: N/A;   CHOLECYSTECTOMY     COLONOSCOPY N/A 02/06/2017   Procedure: COLONOSCOPY;  Surgeon: [name], MD;  Location: [facility]  ENDOSCOPY;  Service: Endoscopy;  Laterality: N/A;   ELECTROPHYSIOLOGIC STUDY N/A 06/06/2016   Procedure: Atrial Fibrillation Ablation;  Surgeon: Will Meredith Leeds, MD;  Location: [place] CV LAB;  Service: Cardiovascular;  Laterality: N/A;   TEE WITHOUT CARDIOVERSION N/A 07/11/2013   Procedure: TRANSESOPHAGEAL ECHOCARDIOGRAM (TEE);  Surgeon: [name], MD;  Location: [facility] ENDOSCOPY;  Service: Cardiovascular;  Laterality: N/A;   TONSILLECTOMY     VASECTOMY       Current Meds  Medication Sig   amLODipine-benazepril (LOTREL) 10-40 MG capsule TAKE 1 CAPSULE DAILY   metoprolol succinate (TOPROL-XL) 50 MG 24 hr tablet TAKE 1 TABLET DAILY WITH OR IMMEDIATELY FOLLOWING A MEAL (STOPPING CARVEDILOL) (Patient taking differently: 50 mg at bedtime.)   rivaroxaban (XARELTO) 20 MG TABS tablet Take 1 tablet (20 mg total) by mouth daily with supper.     Allergies:   Poison ivy extract   Social History   Tobacco Use   Smoking status: Never   Smokeless tobacco: Never  Vaping Use   Vaping Use: Never used  Substance Use Topics   Alcohol use: Yes    Alcohol/week: 2.0 standard drinks    Types: 2 Glasses of wine per week   Drug use: No     Family Hx: The patient's family history includes Prostate cancer in his father; Uterine cancer in his mother.  ROS:   Please see the history of present illness.     All other systems reviewed and are negative.   Prior CV studies:   The following studies were reviewed today:  PAP compliance download  Labs/Other Tests and Data Reviewed:    EKG:  No ECG reviewed.  Recent Labs: 05/20/2021: BUN 28; Creatinine, Ser 1.32; Hemoglobin 15.2; Platelets 310; Potassium 4.4; Sodium 140   Recent Lipid Panel Lab Results  Component Value Date/Time   CHOL 171 04/30/2021 09:25 AM   TRIG 89 04/30/2021 09:25 AM   HDL 51 04/30/2021 09:25 AM   CHOLHDL 3.4 04/30/2021 09:25 AM   CHOLHDL 3.3 05/27/2016 08:44 AM   LDLCALC 104 (H) 04/30/2021 09:25 AM    Wt  Readings from Last 3 Encounters:  08/07/21 257 lb (116.6 kg)  06/03/21 259 lb (117.5 kg)  04/30/21 256 lb (116.1 kg)     Objective:    Vital Signs:  BP (!) 141/95    Pulse 62    Ht 6\' 4"  (1.93 m)    Wt 257 lb (116.6 kg)    BMI 31.28 kg/m    VITAL SIGNS:  reviewed GEN:  no acute distress EYES:  sclerae anicteric, EOMI - Extraocular Movements Intact RESPIRATORY:  normal respiratory effort, symmetric expansion CARDIOVASCULAR:  no peripheral edema SKIN:  no rash, lesions or ulcers. MUSCULOSKELETAL:  no obvious deformities. NEURO:  alert and oriented x 3, no obvious focal deficit PSYCH:  normal affect  ASSESSMENT & PLAN:    OSA - The patient is tolerating PAP therapy well without any problems. The PAP download performed by  his DME was personally reviewed and interpreted by me today and showed an AHI of 0.2/hr on 6 cm H2O with 97% compliance in using more than 4 hours nightly.  The patient has been using and benefiting from PAP use and will continue to benefit from therapy.  -I encouraged him to use the humidification on the device -I recommended he use nasal saline spray twice daily to help with dryness and nose bleeds  2.   Hypertension -BP is borderline controlled on exam today but normally runs 135/47mmHg.  He ate pizza yesterday and thinks he got too much salt.  -Continue prescription drug management with Toprol-XL 50 mg daily and amlodipine-benazepril 10-40 mg daily with as needed refills    COVID-19 Education: The signs and symptoms of COVID-19 were discussed with the patient and how to seek care for testing (follow up with PCP or arrange E-visit).  The importance of social distancing was discussed today.  Time:   Today, I have spent 15 minutes with the patient with telehealth technology discussing the above problems.     Medication Adjustments/Labs and Tests Ordered: Current medicines are reviewed at length with the patient today.  Concerns regarding medicines are outlined  above.   Tests Ordered: No orders of the defined types were placed in this encounter.   Medication Changes: No orders of the defined types were placed in this encounter.   Follow Up:  In Person in 1 year(s)  Signed, Fransico Him, MD  08/07/2021 10:39 AM    [place]

## 2021-08-07 ENCOUNTER — Other Ambulatory Visit: Payer: Self-pay

## 2021-08-07 ENCOUNTER — Telehealth (INDEPENDENT_AMBULATORY_CARE_PROVIDER_SITE_OTHER): Payer: BC Managed Care – PPO | Admitting: Cardiology

## 2021-08-07 ENCOUNTER — Encounter: Payer: Self-pay | Admitting: Cardiology

## 2021-08-07 VITALS — BP 141/95 | HR 62 | Ht 76.0 in | Wt 257.0 lb

## 2021-08-07 DIAGNOSIS — I10 Essential (primary) hypertension: Secondary | ICD-10-CM | POA: Diagnosis not present

## 2021-08-07 DIAGNOSIS — G4733 Obstructive sleep apnea (adult) (pediatric): Secondary | ICD-10-CM

## 2021-08-07 MED ORDER — SALINE SPRAY 0.65 % NA SOLN: 1.0000 | Freq: Two times a day (BID) | NASAL | 0 refills | Status: DC

## 2021-08-07 NOTE — Patient Instructions (Signed)
Medication Instructions:  Your physician has recommended you make the following change in your medication:  1) START using nasal saline spray twice daily *If you need a refill on your cardiac medications before your next appointment, please call your pharmacy*  Follow-Up: At [facility], you and your health needs are our priority.  As part of our continuing mission to provide you with exceptional heart care, we have created designated Provider Care Teams.  These Care Teams include your primary Cardiologist (physician) and Advanced Practice Providers (APPs -  Physician Assistants and Nurse Practitioners) who all work together to provide you with the care you need, when you need it.  Your next appointment:   1 year(s)  The format for your next appointment:   Virtual Visit   Provider:   Fransico Him, MD

## 2021-08-07 NOTE — Addendum Note (Signed)
Addended by: Molli Barrows on: 08/07/2021 11:04 AM   Modules accepted: Orders

## 2021-08-09 DIAGNOSIS — G4733 Obstructive sleep apnea (adult) (pediatric): Secondary | ICD-10-CM | POA: Diagnosis not present

## 2021-08-19 ENCOUNTER — Other Ambulatory Visit: Payer: Self-pay | Admitting: Cardiology

## 2021-09-05 ENCOUNTER — Ambulatory Visit (INDEPENDENT_AMBULATORY_CARE_PROVIDER_SITE_OTHER): Payer: BC Managed Care – PPO | Admitting: Cardiology

## 2021-09-05 ENCOUNTER — Other Ambulatory Visit: Payer: Self-pay

## 2021-09-05 ENCOUNTER — Encounter: Payer: Self-pay | Admitting: Cardiology

## 2021-09-05 VITALS — BP 152/92 | HR 71 | Ht 76.0 in | Wt 264.8 lb

## 2021-09-05 DIAGNOSIS — I4819 Other persistent atrial fibrillation: Secondary | ICD-10-CM

## 2021-09-05 MED ORDER — HYDROCHLOROTHIAZIDE 25 MG PO TABS: 25.0000 mg | ORAL_TABLET | Freq: Every day | ORAL | 1 refills | Status: DC

## 2021-09-05 NOTE — Progress Notes (Signed)
? ?Electrophysiology Office Note ? ? ?Date:  09/05/2021  ? ?ID:  Adrian Clark, DOB 1956/04/25, MRN [id number] ? ?PCP:  Chesley Noon, MD  ?Cardiologist:  [name] ?Primary Electrophysiologist:  Monta Maiorana Meredith Leeds, MD   ? ?No chief complaint on file. ? ? ?  ?History of Present Illness: ?Adrian Clark is a 66 y.o. male who presents today for electrophysiology evaluation.    ? ?He has a history significant for paroxysmal atrial fibrillation status post ablation in 2013 at [facility] in [place], [place], hypertension.  He had more episodes of atrial fibrillation and is status post repeat ablation 06/06/2016.  His right upper pulmonary vein cannot be isolated.  He had a third ablation 06/03/2021 with successful isolation of his right upper pulmonary vein. ? ?Today, denies symptoms of palpitations, chest pain, shortness of breath, orthopnea, PND, lower extremity edema, claudication, dizziness, presyncope, syncope, bleeding, or neurologic sequela. The patient is tolerating medications without difficulties.  His ablation he has done well.  He has noted no further episodes of atrial fibrillation.  He is overall happy with how he has been feeling.  He has stopped his flecainide and his Xarelto.  He does not wish to restart either of these. ? ?Past Medical History:  ?Diagnosis Date  ? HTN (hypertension)   ? OSA (obstructive sleep apnea)   ? PAF (paroxysmal atrial fibrillation) ([place]) 2013  ? s/p ablation   ? ?Past Surgical History:  ?Procedure Laterality Date  ? ABLATION    ? AFIB  ? ATRIAL FIBRILLATION ABLATION N/A 06/03/2021  ? Procedure: ATRIAL FIBRILLATION ABLATION;  Surgeon: Constance Haw, MD;  Location: [place] CV LAB;  Service: Cardiovascular;  Laterality: N/A;  ? CARDIOVERSION N/A 07/11/2013  ? Procedure: CARDIOVERSION;  Surgeon: [name], MD;  Location: [place];  Service: Cardiovascular;  Laterality: N/A;  ? CARDIOVERSION N/A 08/20/2015  ? Procedure: CARDIOVERSION;  Surgeon: [name],  MD;  Location: [place] ENDOSCOPY;  Service: Cardiovascular;  Laterality: N/A;  ? CARDIOVERSION N/A 06/20/2016  ? Procedure: CARDIOVERSION;  Surgeon: [name], MD;  Location: [place];  Service: Cardiovascular;  Laterality: N/A;  ? CARDIOVERSION N/A 08/02/2018  ? Procedure: CARDIOVERSION;  Surgeon: [name], MD;  Location: [facility] ENDOSCOPY;  Service: Cardiovascular;  Laterality: N/A;  ? CARDIOVERSION N/A 02/25/2019  ? Procedure: CARDIOVERSION;  Surgeon: Lelon Perla, MD;  Location: [facility] ENDOSCOPY;  Service: Cardiovascular;  Laterality: N/A;  ? CARDIOVERSION N/A 05/23/2019  ? Procedure: CARDIOVERSION;  Surgeon: Thayer Headings, MD;  Location: [place];  Service: Cardiovascular;  Laterality: N/A;  ? CHOLECYSTECTOMY    ? COLONOSCOPY N/A 02/06/2017  ? Procedure: COLONOSCOPY;  Surgeon: [name], MD;  Location: [facility] ENDOSCOPY;  Service: Endoscopy;  Laterality: N/A;  ? ELECTROPHYSIOLOGIC STUDY N/A 06/06/2016  ? Procedure: Atrial Fibrillation Ablation;  Surgeon: Pieper Kasik Meredith Leeds, MD;  Location: [place] CV LAB;  Service: Cardiovascular;  Laterality: N/A;  ? TEE WITHOUT CARDIOVERSION N/A 07/11/2013  ? Procedure: TRANSESOPHAGEAL ECHOCARDIOGRAM (TEE);  Surgeon: [name], MD;  Location: [facility] ENDOSCOPY;  Service: Cardiovascular;  Laterality: N/A;  ? TONSILLECTOMY    ? VASECTOMY    ? ? ? ?Current Outpatient Medications  ?Medication Sig Dispense Refill  ? amLODipine-benazepril (LOTREL) 10-40 MG capsule TAKE 1 CAPSULE DAILY 90 capsule 3  ? hydrochlorothiazide (HYDRODIURIL) 25 MG tablet Take 1 tablet (25 mg total) by mouth daily. 90 tablet 1  ? metoprolol succinate (TOPROL-XL) 50 MG 24 hr tablet TAKE 1 TABLET DAILY WITH OR  IMMEDIATELY FOLLOWING A MEAL (STOPPING CARVEDILOL) 90 tablet 3  ? flecainide (TAMBOCOR) 100 MG tablet Take 1 tablet (100 mg total) by mouth 2 (two) times daily. 180 tablet 3  ? rivaroxaban (XARELTO) 20 MG TABS tablet Take 1 tablet (20 mg total) by mouth daily with supper. (Patient not  taking: Reported on 09/05/2021) 90 tablet 2  ? sodium chloride (OCEAN) 0.65 % SOLN nasal spray Place 1 spray into both nostrils 2 (two) times daily. (Patient taking differently: Place 1 spray into both nostrils as needed for congestion.)  0  ? ?No current facility-administered medications for this visit.  ? ? ?Allergies:   Poison ivy extract  ? ?Social History:  The patient  reports that he has never smoked. He has never used smokeless tobacco. He reports current alcohol use of about 2.0 standard drinks per week. He reports that he does not use drugs.  ? ?Family History:  The patient's family history includes Prostate cancer in his father; Uterine cancer in his mother.  ? ?ROS:  Please see the history of present illness.   Otherwise, review of systems is positive for none.   All other systems are reviewed and negative.  ? ?PHYSICAL EXAM: ?VS:  BP (!) 152/92   Pulse 71   Ht '6\' 4"'$  (1.93 m)   Wt 264 lb 12.8 oz (120.1 kg)   SpO2 95%   BMI 32.23 kg/m?  , BMI Body mass index is 32.23 kg/m?. ?GEN: Well nourished, well developed, in no acute distress  ?HEENT: normal  ?Neck: no JVD, carotid bruits, or masses ?Cardiac: RRR; no murmurs, rubs, or gallops,no edema  ?Respiratory:  clear to auscultation bilaterally, normal work of breathing ?GI: soft, nontender, nondistended, + BS ?MS: no deformity or atrophy  ?Skin: warm and dry ?Neuro:  Strength and sensation are intact ?Psych: euthymic mood, full affect ? ?EKG:  EKG is ordered today. ?Personal review of the ekg ordered shows sinus rhythm, rate 71 ? ?Recent Labs: ?05/20/2021: BUN 28; Creatinine, Ser 1.32; Hemoglobin 15.2; Platelets 310; Potassium 4.4; Sodium 140  ? ? ?Lipid Panel  ?   ?Component Value Date/Time  ? CHOL 171 04/30/2021 0925  ? TRIG 89 04/30/2021 0925  ? HDL 51 04/30/2021 0925  ? CHOLHDL 3.4 04/30/2021 0925  ? CHOLHDL 3.3 05/27/2016 0844  ? VLDL 21 05/27/2016 0844  ? LDLCALC 104 (H) 04/30/2021 0925  ? ? ? ?Wt Readings from Last 3 Encounters:  ?09/05/21 264 lb  12.8 oz (120.1 kg)  ?08/07/21 257 lb (116.6 kg)  ?06/03/21 259 lb (117.5 kg)  ?  ? ? ?Other studies Reviewed: ?Additional studies/ records that were reviewed today include: TEE 07/11/13  ?Review of the above records today demonstrates:  ?- Left ventricle: Systolic function was normal. The ?  estimated ejection fraction was in the range of 55% to ?  60%. Wall motion was normal; there were no regional wall ?  motion abnormalities. ?- Aortic valve: Trivial regurgitation. ?- Mitral valve: Mild regurgitation. ?- Left atrium: The atrium was moderately dilated. No ?  evidence of thrombus in the atrial cavity. There was a ?  thrombus. Cannot exclude thrombus in the appendage. There ?  was spontaneous echo contrast ("smoke"). ?- Right atrium: No evidence of thrombus in the atrial cavity ?  or appendage. ? ? ?ASSESSMENT AND PLAN: ? ?1.  Persistent atrial fibrillation: Status post ablation 06/06/2016 though unable to isolate the right upper pulmonary vein.  CHA2DS2-VASc of 2.  He has had a repeat ablation 06/03/2021  with successful ablation of the right superior pulmonary vein.  He has stopped his flecainide and Xarelto.  He is overall happy with how he has been feeling.  We Keyshaun Exley continue with current management. ? ?2.  Obstructive sleep apnea: CPAP compliance ? ?3.  Hypertension: Blood pressure significantly elevated today and also elevated at home.  We Kushal Saunders start hydrochlorothiazide 25 mg daily. ? ? ?Current medicines are reviewed at length with the patient today.   ?The patient does not have concerns regarding his medicines.  The following changes were made today: Start hydrochlorothiazide ? ?Labs/ tests ordered today include:  ?Orders Placed This Encounter  ?Procedures  ? EKG 12-Lead  ? ? ? ? ?Disposition:   FU with Zamari Bonsall 3 months ? ?Signed, ?Jamichael Knotts Meredith Leeds, MD  ?09/05/2021 7:50 PM    ? ?[facility] ?[address] ?[address] ?[place] [address] ?[phone] (office) ?[phone]) ?

## 2021-09-05 NOTE — Patient Instructions (Signed)
Medication Instructions:  ?Your physician has recommended you make the following change in your medication:  ?START Hydrochlorothiazide 25 mg once daily ? ?*If you need a refill on your cardiac medications before your next appointment, please call your pharmacy* ? ? ?Lab Work: ?None ordered ? ? ?Testing/Procedures: ?None ordered ? ? ?Follow-Up: ?At [facility], you and your health needs are our priority.  As part of our continuing mission to provide you with exceptional heart care, we have created designated Provider Care Teams.  These Care Teams include your primary Cardiologist (physician) and Advanced Practice Providers (APPs -  Physician Assistants and Nurse Practitioners) who all work together to provide you with the care you need, when you need it. ? ?Your next appointment:   ?3 month(s) ? ?The format for your next appointment:   ?In Person ? ?Provider:   ?Allegra Lai, MD ? ? ? ?Thank you for choosing [facility]!! ? ? ?Trinidad Curet, RN ?([phone] ? ?

## 2021-09-06 DIAGNOSIS — G4733 Obstructive sleep apnea (adult) (pediatric): Secondary | ICD-10-CM | POA: Diagnosis not present

## 2021-09-10 ENCOUNTER — Encounter: Payer: Self-pay | Admitting: Cardiology

## 2021-09-16 DIAGNOSIS — R972 Elevated prostate specific antigen [PSA]: Secondary | ICD-10-CM | POA: Diagnosis not present

## 2021-09-16 DIAGNOSIS — I480 Paroxysmal atrial fibrillation: Secondary | ICD-10-CM | POA: Diagnosis not present

## 2021-09-16 DIAGNOSIS — I10 Essential (primary) hypertension: Secondary | ICD-10-CM | POA: Diagnosis not present

## 2021-09-16 DIAGNOSIS — Z0000 Encounter for general adult medical examination without abnormal findings: Secondary | ICD-10-CM | POA: Diagnosis not present

## 2021-09-16 DIAGNOSIS — M1712 Unilateral primary osteoarthritis, left knee: Secondary | ICD-10-CM | POA: Diagnosis not present

## 2021-10-01 DIAGNOSIS — R972 Elevated prostate specific antigen [PSA]: Secondary | ICD-10-CM | POA: Diagnosis not present

## 2021-10-01 DIAGNOSIS — N138 Other obstructive and reflux uropathy: Secondary | ICD-10-CM | POA: Diagnosis not present

## 2021-10-01 DIAGNOSIS — I10 Essential (primary) hypertension: Secondary | ICD-10-CM | POA: Diagnosis not present

## 2021-10-01 DIAGNOSIS — N401 Enlarged prostate with lower urinary tract symptoms: Secondary | ICD-10-CM | POA: Diagnosis not present

## 2021-10-07 DIAGNOSIS — G4733 Obstructive sleep apnea (adult) (pediatric): Secondary | ICD-10-CM | POA: Diagnosis not present

## 2021-11-06 DIAGNOSIS — G4733 Obstructive sleep apnea (adult) (pediatric): Secondary | ICD-10-CM | POA: Diagnosis not present

## 2021-12-07 DIAGNOSIS — G4733 Obstructive sleep apnea (adult) (pediatric): Secondary | ICD-10-CM | POA: Diagnosis not present

## 2021-12-23 ENCOUNTER — Encounter: Payer: Self-pay | Admitting: Cardiology

## 2021-12-23 ENCOUNTER — Ambulatory Visit: Payer: BC Managed Care – PPO | Admitting: Cardiology

## 2021-12-23 VITALS — BP 130/80 | HR 60 | Ht 76.0 in | Wt 264.6 lb

## 2021-12-23 DIAGNOSIS — I4819 Other persistent atrial fibrillation: Secondary | ICD-10-CM | POA: Diagnosis not present

## 2021-12-23 MED ORDER — RIVAROXABAN 20 MG PO TABS: 20.0000 mg | ORAL_TABLET | Freq: Every day | ORAL | 6 refills | Status: AC | PRN

## 2022-01-06 DIAGNOSIS — G4733 Obstructive sleep apnea (adult) (pediatric): Secondary | ICD-10-CM | POA: Diagnosis not present

## 2022-02-19 ENCOUNTER — Other Ambulatory Visit: Payer: Self-pay | Admitting: Cardiology

## 2022-03-07 DIAGNOSIS — J069 Acute upper respiratory infection, unspecified: Secondary | ICD-10-CM | POA: Diagnosis not present

## 2022-03-07 DIAGNOSIS — R051 Acute cough: Secondary | ICD-10-CM | POA: Diagnosis not present

## 2022-03-07 DIAGNOSIS — I10 Essential (primary) hypertension: Secondary | ICD-10-CM | POA: Diagnosis not present

## 2022-03-07 DIAGNOSIS — H6121 Impacted cerumen, right ear: Secondary | ICD-10-CM | POA: Diagnosis not present

## 2022-04-07 DIAGNOSIS — I10 Essential (primary) hypertension: Secondary | ICD-10-CM | POA: Diagnosis not present

## 2022-04-07 DIAGNOSIS — R972 Elevated prostate specific antigen [PSA]: Secondary | ICD-10-CM | POA: Diagnosis not present

## 2022-04-07 DIAGNOSIS — Z8042 Family history of malignant neoplasm of prostate: Secondary | ICD-10-CM | POA: Diagnosis not present

## 2022-04-07 DIAGNOSIS — N401 Enlarged prostate with lower urinary tract symptoms: Secondary | ICD-10-CM | POA: Diagnosis not present

## 2022-04-07 DIAGNOSIS — N3281 Overactive bladder: Secondary | ICD-10-CM | POA: Diagnosis not present

## 2022-04-07 DIAGNOSIS — N138 Other obstructive and reflux uropathy: Secondary | ICD-10-CM | POA: Diagnosis not present

## 2022-05-06 ENCOUNTER — Telehealth: Payer: Self-pay | Admitting: *Deleted

## 2022-05-06 NOTE — Patient Outreach (Signed)
  Care Coordination   05/06/2022 Name: Adrian Clark MRN: [id number] DOB: Nov 11, 1955   Care Coordination Outreach Attempts:  An unsuccessful telephone outreach was attempted today to offer the patient information about available care coordination services as a benefit of their health plan.   Follow Up Plan:  Additional outreach attempts will be made to offer the patient care coordination information and services.   Encounter Outcome:  No Answer  Care Coordination Interventions Activated:  No   Care Coordination Interventions:  No, not indicated    Eduard Clos MSW, LCSW Licensed Clinical Social Worker      [phone]

## 2022-05-28 ENCOUNTER — Telehealth: Payer: Self-pay

## 2022-05-28 NOTE — Patient Outreach (Signed)
  Care Coordination   Initial Visit Note   05/28/2022 Name: Adrian Clark MRN: [id number] DOB: January 05, 1956  Adrian Clark is a 66 y.o. year old male who sees Chesley Noon, MD for primary care. I spoke with  [name] by phone today.  What matters to the patients health and wellness today?  Placed call to patient and reviewed [facility] care coordination program. Patient reports that he is no lo nger seeing Dr. Curt Bears     SDOH assessments and interventions completed:  No     Care Coordination Interventions:  No, not indicated   Follow up plan: No further intervention required.   Encounter Outcome:  Pt. Refused   Tomasa Rand, RN, BSN, CEN [facility] [employer] [phone]

## 2022-06-09 ENCOUNTER — Encounter: Payer: Self-pay | Admitting: Cardiology

## 2022-06-09 ENCOUNTER — Other Ambulatory Visit: Payer: Self-pay | Admitting: Cardiology

## 2022-06-09 DIAGNOSIS — H25813 Combined forms of age-related cataract, bilateral: Secondary | ICD-10-CM | POA: Diagnosis not present

## 2022-06-09 DIAGNOSIS — H43823 Vitreomacular adhesion, bilateral: Secondary | ICD-10-CM | POA: Diagnosis not present

## 2022-06-10 NOTE — Telephone Encounter (Signed)
Pt reports he had a bad coughing spell and ended up passing out. States he is getting over a bad cold. He does not feel like he has been having any afib. Offered pt appt tomorrow morning with EP APP but pt would prefer to hold off for now and see if occurs again. Pt advised to call back if he would like to be seen and/or he experiences another episode of syncope. Patient verbalized understanding and agreeable to plan.

## 2022-06-16 DIAGNOSIS — M1990 Unspecified osteoarthritis, unspecified site: Secondary | ICD-10-CM | POA: Diagnosis not present

## 2022-06-16 DIAGNOSIS — I10 Essential (primary) hypertension: Secondary | ICD-10-CM | POA: Diagnosis not present

## 2022-06-16 DIAGNOSIS — H25812 Combined forms of age-related cataract, left eye: Secondary | ICD-10-CM | POA: Diagnosis not present

## 2022-06-16 DIAGNOSIS — Z79899 Other long term (current) drug therapy: Secondary | ICD-10-CM | POA: Diagnosis not present

## 2022-06-16 DIAGNOSIS — H527 Unspecified disorder of refraction: Secondary | ICD-10-CM | POA: Diagnosis not present

## 2022-06-16 DIAGNOSIS — G4730 Sleep apnea, unspecified: Secondary | ICD-10-CM | POA: Diagnosis not present

## 2022-06-16 DIAGNOSIS — I480 Paroxysmal atrial fibrillation: Secondary | ICD-10-CM | POA: Diagnosis not present

## 2022-06-16 DIAGNOSIS — H25813 Combined forms of age-related cataract, bilateral: Secondary | ICD-10-CM | POA: Diagnosis not present

## 2022-06-16 DIAGNOSIS — H43823 Vitreomacular adhesion, bilateral: Secondary | ICD-10-CM | POA: Diagnosis not present

## 2022-06-16 DIAGNOSIS — Z96652 Presence of left artificial knee joint: Secondary | ICD-10-CM | POA: Diagnosis not present

## 2022-06-16 DIAGNOSIS — N400 Enlarged prostate without lower urinary tract symptoms: Secondary | ICD-10-CM | POA: Diagnosis not present

## 2022-06-19 DIAGNOSIS — G4733 Obstructive sleep apnea (adult) (pediatric): Secondary | ICD-10-CM | POA: Diagnosis not present

## 2022-08-07 ENCOUNTER — Encounter (HOSPITAL_COMMUNITY): Payer: Self-pay | Admitting: *Deleted

## 2022-08-14 ENCOUNTER — Other Ambulatory Visit: Payer: Self-pay | Admitting: Cardiology

## 2022-08-25 ENCOUNTER — Encounter: Payer: Self-pay | Admitting: Cardiology

## 2022-08-25 ENCOUNTER — Ambulatory Visit: Payer: BC Managed Care – PPO | Attending: Cardiology | Admitting: Cardiology

## 2022-08-25 VITALS — BP 124/86 | HR 64 | Ht 76.0 in | Wt 257.0 lb

## 2022-08-25 DIAGNOSIS — D6869 Other thrombophilia: Secondary | ICD-10-CM | POA: Diagnosis not present

## 2022-08-25 DIAGNOSIS — I10 Essential (primary) hypertension: Secondary | ICD-10-CM | POA: Diagnosis not present

## 2022-08-25 DIAGNOSIS — I4819 Other persistent atrial fibrillation: Secondary | ICD-10-CM | POA: Diagnosis not present

## 2022-08-25 DIAGNOSIS — G4733 Obstructive sleep apnea (adult) (pediatric): Secondary | ICD-10-CM | POA: Diagnosis not present

## 2022-08-25 NOTE — Progress Notes (Signed)
Electrophysiology Office Note   Date:  08/25/2022   ID:  Adonte, Weddel 1955/07/29, MRN [id number]  PCP:  Chesley Noon, MD  Cardiologist:  [name] Primary Electrophysiologist:  Constance Haw, MD    No chief complaint on file.     History of Present Illness: Adrian Clark is a 67 y.o. male who presents today for electrophysiology evaluation.     He has a history send for paroxysmal atrial fibrillation post ablation in 2013 at [facility] in [place], [place], hypertension.  He had more episodes of atrial fibrillation is post ablation 06/06/2016.  Right superior vein was not isolated.  Third ablation 06/03/2021 with successful isolation of the right superior pulmonary vein.  Today, denies symptoms of palpitations, chest pain, shortness of breath, orthopnea, PND, lower extremity edema, claudication, dizziness, presyncope, syncope, bleeding, or neurologic sequela. The patient is tolerating medications without difficulties.  He has had minimal episodes of atrial fibrillation in the last 6 months.  He is overall quite happy with his control.  He is recently retired.  He is ready to get back into exercising.  He is planning on joining [employer].   Past Medical History:  Diagnosis Date   HTN (hypertension)    OSA (obstructive sleep apnea)    PAF (paroxysmal atrial fibrillation) ([place]) 2013   s/p ablation    Past Surgical History:  Procedure Laterality Date   ABLATION     AFIB   ATRIAL FIBRILLATION ABLATION N/A 06/03/2021   Procedure: ATRIAL FIBRILLATION ABLATION;  Surgeon: Constance Haw, MD;  Location: [facility];  Service: Cardiovascular;  Laterality: N/A;   CARDIOVERSION N/A 07/11/2013   Procedure: CARDIOVERSION;  Surgeon: [name], MD;  Location: [facility];  Service: Cardiovascular;  Laterality: N/A;   CARDIOVERSION N/A 08/20/2015   Procedure: CARDIOVERSION;  Surgeon: [name], MD;  Location: [facility];  Service: Cardiovascular;   Laterality: N/A;   CARDIOVERSION N/A 06/20/2016   Procedure: CARDIOVERSION;  Surgeon: [name], MD;  Location: [place];  Service: Cardiovascular;  Laterality: N/A;   CARDIOVERSION N/A 08/02/2018   Procedure: CARDIOVERSION;  Surgeon: [name], MD;  Location: [facility] ENDOSCOPY;  Service: Cardiovascular;  Laterality: N/A;   CARDIOVERSION N/A 02/25/2019   Procedure: CARDIOVERSION;  Surgeon: Lelon Perla, MD;  Location: [facility] ENDOSCOPY;  Service: Cardiovascular;  Laterality: N/A;   CARDIOVERSION N/A 05/23/2019   Procedure: CARDIOVERSION;  Surgeon: Acie Fredrickson Wonda Cheng, MD;  Location: [facility]COPY;  Service: Cardiovascular;  Laterality: N/A;   CHOLECYSTECTOMY     COLONOSCOPY N/A 02/06/2017   Procedure: COLONOSCOPY;  Surgeon: [name], MD;  Location: [facility] ENDOSCOPY;  Service: Endoscopy;  Laterality: N/A;   ELECTROPHYSIOLOGIC STUDY N/A 06/06/2016   Procedure: Atrial Fibrillation Ablation;  Surgeon: Margerite Impastato Meredith Leeds, MD;  Location: [facility];  Service: Cardiovascular;  Laterality: N/A;   TEE WITHOUT CARDIOVERSION N/A 07/11/2013   Procedure: TRANSESOPHAGEAL ECHOCARDIOGRAM (TEE);  Surgeon: [name], MD;  Location: [facility] ENDOSCOPY;  Service: Cardiovascular;  Laterality: N/A;   TONSILLECTOMY     VASECTOMY       Current Outpatient Medications  Medication Sig Dispense Refill   amLODipine-benazepril (LOTREL) 10-40 MG capsule TAKE 1 CAPSULE DAILY 90 capsule 1   hydrochlorothiazide (HYDRODIURIL) 25 MG tablet TAKE 1 TABLET DAILY 90 tablet 3   metoprolol succinate (TOPROL-XL) 50 MG 24 hr tablet TAKE 1 TABLET DAILY WITH OR IMMEDIATELY FOLLOWING A MEAL (STOPPING CARVEDILOL) 90 tablet 3   rivaroxaban (XARELTO) 20 MG  TABS tablet Take 1 tablet (20 mg total) by mouth daily as needed (for episodes of afib). 30 tablet 6   tamsulosin (FLOMAX) 0.4 MG CAPS capsule Take 0.4 mg by mouth daily.     flecainide (TAMBOCOR) 100 MG tablet Take 1 tablet (100 mg total) by mouth 2 (two) times daily.  (Patient not taking: Reported on 12/23/2021) 180 tablet 3   sodium chloride (OCEAN) 0.65 % SOLN nasal spray Place 1 spray into both nostrils 2 (two) times daily.  0   No current facility-administered medications for this visit.    Allergies:   Poison ivy extract   Social History:  The patient  reports that he has never smoked. He has never used smokeless tobacco. He reports current alcohol use of about 2.0 standard drinks of alcohol per week. He reports that he does not use drugs.   Family History:  The patient's family history includes Prostate cancer in his father; Uterine cancer in his mother.   ROS:  Please see the history of present illness.   Otherwise, review of systems is positive for none.   All other systems are reviewed and negative.   PHYSICAL EXAM: VS:  BP 124/86   Pulse 64   Ht '6\' 4"'$  (1.93 m)   Wt 257 lb (116.6 kg)   SpO2 97%   BMI 31.28 kg/m  , BMI Body mass index is 31.28 kg/m. Adrian: Well nourished, well developed, in no acute distress  HEENT: normal  Neck: no JVD, carotid bruits, or masses Cardiac: RRR; no murmurs, rubs, or gallops,no edema  Respiratory:  clear to auscultation bilaterally, normal work of breathing GI: soft, nontender, nondistended, + BS MS: no deformity or atrophy  Skin: warm and dry Neuro:  Strength and sensation are intact Psych: euthymic mood, full affect  EKG:  EKG is ordered today. Personal review of the ekg ordered shows sinus rhythm   Recent Labs: No results found for requested labs within last 365 days.    Lipid Panel     Component Value Date/Time   CHOL 171 04/30/2021 0925   TRIG 89 04/30/2021 0925   HDL 51 04/30/2021 0925   CHOLHDL 3.4 04/30/2021 0925   CHOLHDL 3.3 05/27/2016 0844   VLDL 21 05/27/2016 0844   LDLCALC 104 (H) 04/30/2021 0925     Wt Readings from Last 3 Encounters:  08/25/22 257 lb (116.6 kg)  12/23/21 264 lb 9.6 oz (120 kg)  09/05/21 264 lb 12.8 oz (120.1 kg)      Other studies Reviewed: Additional  studies/ records that were reviewed today include: TEE 07/11/13  Review of the above records today demonstrates:  - Left ventricle: Systolic function was normal. The   estimated ejection fraction was in the range of 55% to   60%. Wall motion was normal; there were no regional wall   motion abnormalities. - Aortic valve: Trivial regurgitation. - Mitral valve: Mild regurgitation. - Left atrium: The atrium was moderately dilated. No   evidence of thrombus in the atrial cavity. There was a   thrombus. Cannot exclude thrombus in the appendage. There   was spontaneous echo contrast ("smoke"). - Right atrium: No evidence of thrombus in the atrial cavity   or appendage.   ASSESSMENT AND PLAN:  1.  Persistent atrial fibrillation: Status post ablation 06/06/2016.  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  CHA2DS2-VASc of 2.  Repeat ablation 06/03/2021 with successful right superior vein isolation.  He is continue to have episodes of atrial fibrillation.  Not taking flecainide on a daily basis.  Takes it when he is in atrial fibrillation.  Has had minimal episodes of atrial fibrillation.  No changes.  2.  Obstructive sleep apnea: CPAP compliance encouraged  3.  Hypertension: Currently well-controlled  4.  Secondary hypercoagulable state: Currently on Eliquis for atrial fibrillation    Current medicines are reviewed at length with the patient today.   The patient does not have concerns regarding his medicines.  The following changes were made today: None  Labs/ tests ordered today include:  Orders Placed This Encounter  Procedures   EKG 12-Lead      Disposition:   FU 9 months  Signed, Natajah Derderian Meredith Leeds, MD  08/25/2022 8:25 AM     [facility] [address] [place] [address] [phone] (office) [phone] (fax)

## 2022-08-25 NOTE — Patient Instructions (Signed)
Medication Instructions:  Your physician recommends that you continue on your current medications as directed. Please refer to the Current Medication list given to you today.  *If you need a refill on your cardiac medications before your next appointment, please call your pharmacy*   Lab Work: None ordered   Testing/Procedures: None ordered   Follow-Up: At [facility], you and your health needs are our priority.  As part of our continuing mission to provide you with exceptional heart care, we have created designated Provider Care Teams.  These Care Teams include your primary Cardiologist (physician) and Advanced Practice Providers (APPs -  Physician Assistants and Nurse Practitioners) who all work together to provide you with the care you need, when you need it.  Your next appointment:   9 month(s)  The format for your next appointment:   In Person  Provider:   Allegra Lai, MD    Thank you for choosing [facility]!!   Trinidad Curet, RN [phone]

## 2023-01-02 ENCOUNTER — Other Ambulatory Visit: Payer: Self-pay | Admitting: Cardiology

## 2023-05-21 NOTE — Progress Notes (Signed)
  Electrophysiology Office Note:   Date:  05/22/2023  ID:  Adrian Clark, DOB 1955/08/14, MRN [id number]  Primary Cardiologist: Armanda Magic, MD Electrophysiologist: Regan Lemming, MD      History of Present Illness:   Adrian Clark is a 67 y.o. male with h/o AF s/p ablation x2, OSA, and HTN seen today for routine electrophysiology followup.   Since last being seen in our clinic the patient reports doing well overall. Had AF in April that lasted < 1 day. Took flecainide but not Xarelto since it was so short. Otherwise, he denies chest pain, palpitations, dyspnea, PND, orthopnea, nausea, vomiting, dizziness, syncope, edema, weight gain, or early satiety.   Review of systems complete and found to be negative unless listed in HPI.   EP Information / Studies Reviewed:    EKG is ordered today. Personal review as below.  EKG Interpretation Date/Time:  Friday May 22 2023 08:52:32 EST Ventricular Rate:  64 PR Interval:  196 QRS Duration:  112 QT Interval:  426 QTC Calculation: 439 R Axis:   -52  Text Interpretation: Normal sinus rhythm Left anterior fascicular block Confirmed by Maxine Glenn [id number]) on 05/22/2023 8:59:05 AM    Arrhythmia History  AF ablation 2017 and redo 2022  CT Coronaries 05/27/2021 Calcium 0 Ascending thoracic aorta 4.8 cm  CT calcium 05/2023 ([facility]) Calcium 0 Ascending thoracic aorta 4.9 cm   Physical Exam:   VS:  BP 120/84 (BP Location: Left Arm, Patient Position: Sitting, Cuff Size: Large)   Pulse 64   Ht 6\' 4"  (1.93 m)   Wt 260 lb 6.4 oz (118.1 kg)   SpO2 97%   BMI 31.70 kg/m    Wt Readings from Last 3 Encounters:  05/22/23 260 lb 6.4 oz (118.1 kg)  08/25/22 257 lb (116.6 kg)  12/23/21 264 lb 9.6 oz (120 kg)     GEN: Well nourished, well developed in no acute distress NECK: No JVD; No carotid bruits CARDIAC: Regular rate and rhythm, no murmurs, rubs, gallops RESPIRATORY:  Clear to auscultation without rales, wheezing or  rhonchi  ABDOMEN: Soft, non-tender, non-distended EXTREMITIES:  No edema; No deformity   ASSESSMENT AND PLAN:    Persistent Atrial Fibrillation  S/p Ablation 2017 and 2022 EKG today shows NSR with stable intervals Pt uses flecainide and Xarelto with a pill in pocket approach.  CHA2DS2VASc  is at least 2  OSA  Encouraged nightly CPAP  HTN Stable on current regimen   Secondary hypercoagulable state Pt with Xarelto available as above   Ascending Aortic Aneurysm 4.8 cm on CTA prior to Ablation 2022, 4.9cm on CT this year Will refer to [facility] to establish for follow up and recommendations for further imaging.    Follow up with Dr. Elberta Fortis in 12 months. Will refer to [facility] to follow Ascending Aortic aneurysm as above.  Signed, Graciella Freer, PA-C

## 2023-05-22 ENCOUNTER — Encounter: Payer: Self-pay | Admitting: Student

## 2023-05-22 ENCOUNTER — Ambulatory Visit: Payer: Medicare PPO | Attending: Student | Admitting: Student

## 2023-05-22 VITALS — BP 120/84 | HR 64 | Ht 76.0 in | Wt 260.4 lb

## 2023-05-22 DIAGNOSIS — I4819 Other persistent atrial fibrillation: Secondary | ICD-10-CM | POA: Diagnosis not present

## 2023-05-22 DIAGNOSIS — I10 Essential (primary) hypertension: Secondary | ICD-10-CM

## 2023-05-22 DIAGNOSIS — G4733 Obstructive sleep apnea (adult) (pediatric): Secondary | ICD-10-CM | POA: Diagnosis not present

## 2023-05-22 DIAGNOSIS — I7121 Aneurysm of the ascending aorta, without rupture: Secondary | ICD-10-CM

## 2023-05-22 DIAGNOSIS — D6869 Other thrombophilia: Secondary | ICD-10-CM

## 2023-05-22 MED ORDER — FLECAINIDE ACETATE 100 MG PO TABS: 100.0000 mg | ORAL_TABLET | Freq: Two times a day (BID) | ORAL | 3 refills | Status: AC

## 2023-05-22 NOTE — Patient Instructions (Addendum)
Medication Instructions:  Your physician recommends that you continue on your current medications as directed. Please refer to the Current Medication list given to you today.  *If you need a refill on your cardiac medications before your next appointment, please call your pharmacy*  Lab Work: None ordered If you have labs (blood work) drawn today and your tests are completely normal, you will receive your results only by: MyChart Message (if you have MyChart) OR A paper copy in the mail If you have any lab test that is abnormal or we need to change your treatment, we will call you to review the results.  Follow-Up: At [facility], you and your health needs are our priority.  As part of our continuing mission to provide you with exceptional heart care, we have created designated Provider Care Teams.  These Care Teams include your primary Cardiologist (physician) and Advanced Practice Providers (APPs -  Physician Assistants and Nurse Practitioners) who all work together to provide you with the care you need, when you need it.  Your next appointment:   1 year(s)  Provider:   Loman Brooklyn, MD   You have been referred to cardiothoracic surgery.

## 2023-05-25 ENCOUNTER — Other Ambulatory Visit: Payer: Self-pay | Admitting: *Deleted

## 2023-05-25 NOTE — Progress Notes (Signed)
Spoke with patient and made him aware of the need for a chest cta prior to consult with [facility], but he states that he had one done a few weeks ago with [place] [facility]. He is going to have the report faxed to our office so that we can scan it into his chart.

## 2023-06-08 ENCOUNTER — Other Ambulatory Visit: Payer: Self-pay | Admitting: *Deleted

## 2023-06-08 DIAGNOSIS — I7121 Aneurysm of the ascending aorta, without rupture: Secondary | ICD-10-CM

## 2023-06-08 DIAGNOSIS — Z79899 Other long term (current) drug therapy: Secondary | ICD-10-CM

## 2023-06-08 NOTE — Progress Notes (Signed)
Graciella Freer, PA-C  Kamil Mchaffie L, CMA; Steve Rattler, RN The one he had at [facility] was just a plain CT for calcium scoring, and they want a CT angiogram with contrast.   The CT angiogram will be a better test to get an exact measurement of his aorta, while it has dye going through it       Previous Messages    ----- Message ----- From: Valrie Hart, CMA Sent: 05/25/2023   4:28 PM EST To: Graciella Freer, PA-C; * Subject: RE: Testing needed                            Spoke with patient and he says that he recently had a chest cta with [facility]. He is going to have them fax the report to [place] to be scanned into his chart.  Thanks, Farley Crooker ----- Message ----- From: Graciella Freer, Cordelia Poche Sent: 05/25/2023   8:14 AM EST To: Sulay Brymer Chyrl Civatte, CMA; Steve Rattler, RN Subject: RE: Testing needed                            [facility], thank you!  Octavius Shin, can we please get him set up for a CTA chest for Ascending Aorta aneursym? ----- Message ----- From: Steve Rattler, RN Sent: 05/22/2023   3:14 PM EST To: Wynetta Seith Chyrl Civatte, CMA; * Subject: Testing needed                                Hi Deval Mroczka,  Our physicians request a CTA chest to be completed before being scheduled. Once the CTA is schedule, we can get him in.  Thanks! Morrie Sheldon

## 2023-06-23 ENCOUNTER — Other Ambulatory Visit: Payer: Self-pay

## 2023-06-23 MED ORDER — METOPROLOL SUCCINATE ER 50 MG PO TB24: 50.0000 mg | ORAL_TABLET | Freq: Every day | ORAL | 0 refills | Status: DC

## 2023-06-27 ENCOUNTER — Ambulatory Visit (HOSPITAL_BASED_OUTPATIENT_CLINIC_OR_DEPARTMENT_OTHER): Payer: Medicare PPO

## 2023-07-08 NOTE — Progress Notes (Signed)
[address]       [place] [address]             [phone]    PCP is Eartha Inch, MD Referring Provider is Eartha Inch, MD  Chief Complaint: Ascending thoracic aortic aneurysm   HPI: This is a 68 year old male with a past medical history of hypertension, OSA, and PAF (s/p ablation) who was found to have a moderate ascending aortic root enlargement 4.5 cm on cardiac CT in December 2017. Cardiac CT done in November 2022 showed an ascending aortic aneurysm, measuring 48 mm (moderate-severe dilation) and a sinus of Valsalva 46 mm (moderate dilation). He presents today to establish further evaluation and surveillance of the ATAA. He denies chest pain, pressure, or tightness.  Past Medical History:  Diagnosis Date   HTN (hypertension)    OSA (obstructive sleep apnea)    PAF (paroxysmal atrial fibrillation) (HCC) 2013   s/p ablation x 3    Past Surgical History:  Procedure Laterality Date   ABLATION     AFIB   ATRIAL FIBRILLATION ABLATION N/A 06/03/2021   Procedure: ATRIAL FIBRILLATION ABLATION;  Surgeon: Regan Lemming, MD;  Location: [facility];  Service: Cardiovascular;  Laterality: N/A;   CARDIOVERSION N/A 07/11/2013   Procedure: CARDIOVERSION;  Surgeon: Quintella Reichert, MD;  Location: [facility] ENDOSCOPY;  Service: Cardiovascular;  Laterality: N/A;   CARDIOVERSION N/A 08/20/2015   Procedure: CARDIOVERSION;  Surgeon: Thurmon Fair, MD;  Location: [facility] ENDOSCOPY;  Service: Cardiovascular;  Laterality: N/A;   CARDIOVERSION N/A 06/20/2016   Procedure: CARDIOVERSION;  Surgeon: Lars Masson, MD;  Location: [facility] ENDOSCOPY;  Service: Cardiovascular;  Laterality: N/A;   CARDIOVERSION N/A 08/02/2018   Procedure: CARDIOVERSION;  Surgeon: Jake Bathe, MD;  Location: [facility] ENDOSCOPY;  Service: Cardiovascular;  Laterality: N/A;   CARDIOVERSION N/A 02/25/2019   Procedure: CARDIOVERSION;  Surgeon: Lewayne Bunting, MD;  Location: [facility] ENDOSCOPY;  Service:  Cardiovascular;  Laterality: N/A;   CARDIOVERSION N/A 05/23/2019   Procedure: CARDIOVERSION;  Surgeon: Elease Hashimoto Deloris Ping, MD;  Location: [facility] ENDOSCOPY;  Service: Cardiovascular;  Laterality: N/A;   CHOLECYSTECTOMY     COLONOSCOPY N/A 02/06/2017   Procedure: COLONOSCOPY;  Surgeon: Jeani Hawking, MD;  Location: [facility] ENDOSCOPY;  Service: Endoscopy;  Laterality: N/A;   ELECTROPHYSIOLOGIC STUDY N/A 06/06/2016   Procedure: Atrial Fibrillation Ablation;  Surgeon: Will Jorja Loa, MD;  Location: [facility] INVAS[facility] CV [facility];  Service: Cardiovascular;  Laterality: N/A;   TEE WITHOUT CARDIOVERSION N/A 07/11/2013   Procedure: TRANSESOPHAGEAL ECHOCARDIOGRAM (TEE);  Surgeon: Quintella Reichert, MD;  Location: [facility] ENDOSCOPY;  Service: Cardiovascular;  Laterality: N/A;   TONSILLECTOMY     VASECTOMY    Cataract surgery  Family History  Problem Relation Age of Onset   Uterine cancer Mother    Prostate cancer Father     Social History Social History   Tobacco Use   Smoking status: Never   Smokeless tobacco: Never  Vaping Use   Vaping status: Never Used  Substance Use Topics   Alcohol use: Yes    Alcohol/week: 2.0 standard drinks of alcohol    Types: 2 Glasses of wine per week   Drug use: No    Current Outpatient Medications  Medication Sig Dispense Refill   amLODipine-benazepril (LOTREL) 10-40 MG capsule TAKE 1 CAPSULE BY MOUTH DAILY 90 capsule 2   flecainide (TAMBOCOR) 100 MG tablet Take 1 tablet (100 mg total) by mouth 2 (two) times daily.  180 tablet 3   hydrochlorothiazide (HYDRODIURIL) 25 MG tablet TAKE 1 TABLET BY MOUTH DAILY 90 tablet 2   metoprolol succinate (TOPROL-XL) 50 MG 24 hr tablet Take 1 tablet (50 mg total) by mouth daily. Take with or immediately following a meal. 30 tablet 0   rivaroxaban (XARELTO) 20 MG TABS tablet Take 1 tablet (20 mg total) by mouth daily as needed (for episodes of afib). 30 tablet 6   sodium chloride (OCEAN) 0.65 % SOLN nasal spray Place 1 spray into both nostrils 2 (two)  times daily.  0   solifenacin (VESICARE) 5 MG tablet Take 5 mg by mouth daily.     tamsulosin (FLOMAX) 0.4 MG CAPS capsule Take 0.4 mg by mouth daily.     Allergies  Allergen Reactions   Poison Ivy Extract Itching    Review of Systems Chest Pain [  N] Exertional SOB Klaus.Mock  ]  Pedal Edema [ N ] Syncope [ N ]   General Review of Systems: [Y] = yes [ N]=no  Consitutional:   nausea [ N];  fever Klaus.Mock ];  Resp: cough [ N];  hemoptysis[ N];  GI: vomiting[ N]; melena[ N]; hematochezia [N];  FA:OZHYQMVHQ[ N]; Heme/Lymph: anemia[ N];  Neuro: TIA[ N];stroke[N ];  seizures[ N];  Endocrine: diabetes[N ];   Vital Signs: Vitals:   07/22/23 1428  BP: 136/80  Pulse: 70  Resp: 18  SpO2: 95%     Physical Exam: CV-RRR, no murmur Neck-No carotid bruit Pulmonary-Clear to auscultation bilaterally Abdomen-Soft, mildly obese, non tender, bowel sounds present Extremities-no LE edema Neurologic-Grossly intact without focal deficit  Diagnostic Tests:  Narrative & Impression  CLINICAL DATA:  Follow-up ascending thoracic aortic aneurysm.   EXAM: CT ANGIOGRAPHY CHEST WITH CONTRAST   TECHNIQUE: Multidetector CT imaging of the chest was performed using the standard protocol during bolus administration of intravenous contrast. Multiplanar CT image reconstructions and MIPs were obtained to evaluate the vascular anatomy.   RADIATION DOSE REDUCTION: This exam was performed according to the departmental dose-optimization program which includes automated exposure control, adjustment of the mA and/or kV according to patient size and/or use of iterative reconstruction technique.   CONTRAST:  75mL OMNIPAQUE IOHEXOL 350 MG/ML SOLN   COMPARISON:  Coronary CTA and calcium scoring CT dated 05/27/2021.   FINDINGS: Cardiovascular: Small amount of atheromatous aortic calcification. At the level of the main pulmonary artery, the ascending thoracic aorta measures 4.7 cm in maximum diameter, previously 4.8  cm. The aortic arch and descending thoracic aorta remain normal in caliber. Normal-sized central pulmonary arteries.   Mediastinum/Nodes: No enlarged mediastinal, hilar, or axillary lymph nodes. Thyroid gland, trachea, and esophagus demonstrate no significant findings.   Lungs/Pleura: Small amount of linear atelectasis/scarring at both lung bases. No pleural fluid or lung nodules.   Upper Abdomen: Unremarkable.   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 Stable mild anterior wedge deformities in the midthoracic spine with no acute fracture lines or bony retropulsion.   Review of the MIP images confirms the above findings.   IMPRESSION: 1. 4.7 cm ascending thoracic aortic aneurysm, previously 4.8 cm. Recommend semi-annual imaging followup by CTA or MRA and referral to cardiothoracic surgery if not already obtained. This recommendation follows 2010 ACCF/AHA/AATS/ACR/ASA/SCA/SCAI/SIR/STS/SVM Guidelines for the Diagnosis and Management of Patients With Thoracic Aortic Disease. Circulation. 2010; 121: I696-E952. Aortic aneurysm NOS (ICD10-I71.9) 2. Aortic atherosclerosis.   Aortic Atherosclerosis (ICD10-I70.0).     Electronically Signed   By: Beckie Salts M.D.   On: 07/18/2023  18:03    Impression and Plan: CTA with a 4.7 cm ascending aortic aneurysm.  Echocardiogram done in 2015 showed tricuspid aortic valve and trivial aortic regurgitation.  We discussed the natural history and and risk factors for growth of ascending aortic aneurysms.  We covered the importance of smoking cessation, tight blood pressure control, refraining from lifting heavy objects, and avoiding fluoroquinolones.  The patient is aware of signs and symptoms of aortic dissection and when to present to the emergency department.  We will continue surveillance and a repeat CTA was ordered for 6 months.     Ardelle Balls, PA-C [facility] [phone]

## 2023-07-14 ENCOUNTER — Encounter (HOSPITAL_BASED_OUTPATIENT_CLINIC_OR_DEPARTMENT_OTHER): Payer: Self-pay

## 2023-07-14 ENCOUNTER — Ambulatory Visit (HOSPITAL_BASED_OUTPATIENT_CLINIC_OR_DEPARTMENT_OTHER)
Admission: RE | Admit: 2023-07-14 | Discharge: 2023-07-14 | Disposition: A | Discharge status: Home / Self Care | Payer: Medicare PPO | Source: Ambulatory Visit | Attending: Student | Admitting: Student

## 2023-07-14 DIAGNOSIS — I7121 Aneurysm of the ascending aorta, without rupture: Secondary | ICD-10-CM | POA: Insufficient documentation

## 2023-07-14 MED ORDER — IOHEXOL 350 MG/ML SOLN
100.0000 mL | Freq: Once | INTRAVENOUS | Status: AC | PRN
  Administered 2023-07-14: 75 mL via INTRAVENOUS

## 2023-07-22 ENCOUNTER — Institutional Professional Consult (permissible substitution): Payer: Medicare PPO

## 2023-07-22 VITALS — BP 136/80 | HR 70 | Resp 18 | Ht 76.0 in | Wt 257.0 lb

## 2023-07-22 DIAGNOSIS — I7121 Aneurysm of the ascending aorta, without rupture: Secondary | ICD-10-CM

## 2023-07-22 NOTE — Patient Instructions (Addendum)
Risk Modification in those with ascending thoracic aortic aneurysm:  Continue good control of blood pressure (prefer SBP 130/80 or less)-continue to use Toprol XL, hydrochlorothiazide, and Lotrel  2. Avoid fluoroquinolone antibiotics (I.e Ciprofloxacin, Avelox, Levofloxacin, Ofloxacin)  3.  Use of statin (to decrease cardiovascular risk)-Lipid Profile done May 2024: Total cholesterol 165, Triglyceride 133, HDL 48, LDL 93. Will defer to PCP if/when to intiate  4.  Exercise and activity limitations is individualized, but in general, contact sports are to be avoided and one should avoid heavy lifting (defined as half of ideal body weight) and exercises involving sustained Valsalva maneuver.  5. Counseling for those suspected of having genetically mediated disease. First-degree relatives of those with TAA disease should be screened as well as those who have a connective tissue disease (I.e with Marfan syndrome, Ehlers-Danlos syndrome,  and Loeys-Dietz syndrome) or a  bicuspid aortic valve,have an increased risk for complications related to TAA. Patient does not have a family history of connective tissue disease. Echocardiogram done in 2015 showed tricuspid aortic valve and trivial aortic regurgitation.  6. Patient does not have a history of tobacco abuse

## 2023-07-23 ENCOUNTER — Other Ambulatory Visit: Payer: Self-pay | Admitting: Cardiology

## 2023-09-21 ENCOUNTER — Other Ambulatory Visit: Payer: Self-pay | Admitting: Cardiology

## 2023-09-25 ENCOUNTER — Other Ambulatory Visit: Payer: Self-pay | Admitting: Cardiology

## 2023-11-16 ENCOUNTER — Other Ambulatory Visit: Payer: Self-pay | Admitting: Cardiology

## 2023-11-16 ENCOUNTER — Encounter: Payer: Self-pay | Admitting: Cardiology

## 2023-11-17 NOTE — Telephone Encounter (Signed)
 Dr. Charl Concha pt. Last visit was a video visit on 08/07/21. This pt is passed his 3 attempts. Does Dr. Micael Adas want to refill? Please advise.

## 2023-11-17 NOTE — Telephone Encounter (Signed)
 Call to patient who is requesting refill on Toprol . Patient states Dr. Lawana Pray is who refills his toprol , however most recent fill is under Dr. Micael Adas. Dr. Micael Adas last saw patient in 2023 and it appears she was only seeing him for hypertension and sleep apnea. Patient states that Dr. Lawana Pray has taken over all his cardiology care and he does not want to come in for an additional visit with a different provider. Explained that he is due for a sleep apnea visit but patient states "I am out of my toprol , I care more about that right now." Forwarded to Dr. Micael Adas and Dr. Lawana Pray.

## 2023-11-18 MED ORDER — METOPROLOL SUCCINATE ER 50 MG PO TB24: 50.0000 mg | ORAL_TABLET | Freq: Every day | ORAL | 1 refills | Status: DC

## 2023-12-14 ENCOUNTER — Other Ambulatory Visit: Payer: Self-pay | Admitting: Surgery

## 2023-12-14 DIAGNOSIS — I7121 Aneurysm of the ascending aorta, without rupture: Secondary | ICD-10-CM

## 2024-01-04 ENCOUNTER — Ambulatory Visit (HOSPITAL_COMMUNITY)
Admission: RE | Admit: 2024-01-04 | Discharge: 2024-01-04 | Disposition: A | Discharge status: Home / Self Care | Source: Ambulatory Visit | Attending: Surgery | Admitting: Surgery

## 2024-01-04 DIAGNOSIS — I7121 Aneurysm of the ascending aorta, without rupture: Secondary | ICD-10-CM | POA: Insufficient documentation

## 2024-01-04 MED ORDER — IOHEXOL 350 MG/ML SOLN
100.0000 mL | Freq: Once | INTRAVENOUS | Status: AC | PRN
  Administered 2024-01-04: 100 mL via INTRAVENOUS

## 2024-01-07 NOTE — Patient Instructions (Signed)
 Continue careful blood pressure management with a goal of less than 130/80.  Avoid strenuous activity with no lifting greater than 50 pounds.  Avoid the quinolone class of antibiotics as these have been shown to weaken connective tissue and can increase the risk of aortic dilation or dissection.  Follow-up in 6 months with CTA chest and echocardiogram.

## 2024-01-07 NOTE — Progress Notes (Signed)
 HPI: [name] is a 68 year old gentleman with a past history of hypertension and persistent atrial fibrillation treated with metoprolol , Tambocor , and Xarelto .  He has been followed regularly by our practice for surveillance of a thoracic aortic aneurysm last measuring 4.7 cm about 6 months ago.   He returns today for scheduled 58-month follow-up after CTA chest.  He has had no changes in his health since his last visit.  He feels well, has not had chest pain or shortness of breath.  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   Current Outpatient Medications  Medication Sig Dispense Refill   amLODipine -benazepril  (LOTREL) 10-40 MG capsule TAKE 1 CAPSULE BY MOUTH DAILY 90 capsule 2   flecainide  (TAMBOCOR ) 100 MG tablet Take 1 tablet (100 mg total) by mouth 2 (two) times daily. 180 tablet 3   hydrochlorothiazide  (HYDRODIURIL ) 25 MG tablet TAKE 1 TABLET BY MOUTH DAILY 90 tablet 2   metoprolol  succinate (TOPROL -XL) 50 MG 24 hr tablet Take 1 tablet (50 mg total) by mouth daily. Take with or immediately following a meal. 90 tablet 1   rivaroxaban  (XARELTO ) 20 MG TABS tablet Take 1 tablet (20 mg total) by mouth daily as needed (for episodes of afib). 30 tablet 6   sodium chloride  (OCEAN) 0.65 % SOLN nasal spray Place 1 spray into both nostrils 2 (two) times daily.  0   solifenacin (VESICARE) 5 MG tablet Take 5 mg by mouth daily.     tamsulosin (FLOMAX) 0.4 MG CAPS capsule Take 0.4 mg by mouth daily.     No current facility-administered medications for this visit.    Physical Exam: Vital signs BP 122/78 Heart rate 60 Respirations 20 SpO2 96% on room air  General: Pleasant 68 year old gentleman in no distress Heart: Regular rate and rhythm, no murmur Chest: Breath sounds are full, clear, and equal. Extremities: No peripheral edema, all are well-perfused. Neuro: Intact    Diagnostic  Tests: CLINICAL DATA:  Follow-up of aneurysmal disease of the ascending thoracic aorta.   EXAM: CT ANGIOGRAPHY CHEST WITH CONTRAST   TECHNIQUE: Multidetector CT imaging of the chest was performed using the standard protocol during bolus administration of intravenous contrast. Multiplanar CT image reconstructions and MIPs were obtained to evaluate the vascular anatomy.   RADIATION DOSE REDUCTION: This exam was performed according to the departmental dose-optimization program which includes automated exposure control, adjustment of the mA and/or kV according to patient size and/or use of iterative reconstruction technique.   CONTRAST:  OMNIPAQUE  IOHEXOL  350 MG/ML SOLN   COMPARISON:  CTA chest 07/14/2023, coronary CTA 05/27/2021 and CTA heart 05/30/2016   FINDINGS: Cardiovascular: The aortic root measures approximately 4.3 cm at the level of the sinuses of Valsalva. The ascending thoracic aorta demonstrates stable aneurysmal disease measuring approximately 4.6 cm in maximum transverse diameter. The aortic arch measures 3.5 cm. The descending thoracic aorta measures 3 cm. No evidence of aortic dissection. Visualized proximal great vessels demonstrate normal patency and normal branching anatomy.   The heart size is normal. No pericardial fluid. No significant calcified coronary artery plaque. Central pulmonary arteries are normal in caliber.   Mediastinum/Nodes: No enlarged mediastinal, hilar, or axillary lymph nodes. Thyroid  gland, trachea, and esophagus demonstrate no significant findings.   Lungs/Pleura: At a E bifurcation/trifurcation of posterior left lower lobe vessels on images 79-82, there is a left lower lobe nodule measuring roughly  8-9 mm which in retrospect was present on all of the prior studies and is therefore benign. This was clearly present dating back to 2017 and is stable in size. There is no evidence of pulmonary edema, consolidation, pneumothorax or  pleural fluid.   Upper Abdomen: No acute abnormality.   Musculoskeletal: No chest wall abnormality. No acute or significant osseous findings.   Review of the MIP images confirms the above findings.   IMPRESSION: 1. Stable aneurysmal disease of the ascending thoracic aorta measuring 4.6 cm in maximum transverse diameter. Ascending thoracic aortic aneurysm. Recommend semi-annual imaging followup by CTA or MRA and referral to cardiothoracic surgery if not already obtained. This recommendation follows 2010 ACCF/AHA/AATS/ACR/ASA/SCA/SCAI/SIR/STS/SVM Guidelines for the Diagnosis and Management of Patients With Thoracic Aortic Disease. Circulation. 2010; 121: Z733-z630. Aortic aneurysm NOS (ICD10-I71.9) 2. 8-9 mm left lower lobe nodule which in retrospect was present on all of the prior studies and is therefore benign. This was clearly present dating back to 2017 and is stable in size.     Electronically Signed   By: Marcey Moan M.D.   On: 01/05/2024 09:29  Impression / Plan: 68 year old male with a history of hypertension and atrial fibrillation with stable 4.6 cm thoracic aortic aneurysm.  We reviewed recommendations for careful blood pressure control, activity limitations, and avoidance of quinolone antibiotics.  His most recent lipid profile in 2022 was normal so he is not on statin therapy.  His last echocardiogram was 10 years ago showing a trileaflet aortic valve with normal function.  We reviewed recommendations for careful blood pressure management, activity limitations, and avoidance of quinolone antibiotics that are known to weaken connective tissue in some patients.  Plan for follow-up in 6 months with CTA chest and will also get a follow-up echo prior to that visit.    Inioluwa Baris G. Dmoni Fortson, PA-C [facility] [phone]

## 2024-01-12 ENCOUNTER — Ambulatory Visit: Attending: Surgery | Admitting: Physician Assistant

## 2024-01-12 VITALS — BP 122/78 | HR 60 | Resp 20 | Ht 76.0 in | Wt 261.0 lb

## 2024-01-12 DIAGNOSIS — I7121 Aneurysm of the ascending aorta, without rupture: Secondary | ICD-10-CM

## 2024-05-27 ENCOUNTER — Other Ambulatory Visit: Payer: Self-pay | Admitting: Cardiology

## 2024-05-31 ENCOUNTER — Other Ambulatory Visit: Payer: Self-pay | Admitting: Cardiology

## 2024-06-09 ENCOUNTER — Other Ambulatory Visit: Payer: Self-pay | Admitting: Surgery

## 2024-06-09 DIAGNOSIS — I7121 Aneurysm of the ascending aorta, without rupture: Secondary | ICD-10-CM

## 2024-06-23 ENCOUNTER — Other Ambulatory Visit: Payer: Self-pay | Admitting: Cardiology

## 2024-06-27 ENCOUNTER — Other Ambulatory Visit: Payer: Self-pay

## 2024-06-28 MED ORDER — AMLODIPINE BESY-BENAZEPRIL HCL 10-40 MG PO CAPS: 1.0000 | ORAL_CAPSULE | Freq: Every day | ORAL | 0 refills | Status: AC

## 2024-07-06 ENCOUNTER — Ambulatory Visit (HOSPITAL_BASED_OUTPATIENT_CLINIC_OR_DEPARTMENT_OTHER)
Admission: RE | Admit: 2024-07-06 | Discharge: 2024-07-06 | Disposition: A | Discharge status: Home / Self Care | Source: Ambulatory Visit | Attending: Surgery | Admitting: Surgery

## 2024-07-06 DIAGNOSIS — I7121 Aneurysm of the ascending aorta, without rupture: Secondary | ICD-10-CM | POA: Insufficient documentation

## 2024-07-06 MED ORDER — IOHEXOL 350 MG/ML SOLN
100.0000 mL | Freq: Once | INTRAVENOUS | Status: AC | PRN
  Administered 2024-07-06: 75 mL via INTRAVENOUS

## 2024-07-12 NOTE — Progress Notes (Unsigned)
 " Electrophysiology Office Note:   Date:  07/13/2024  ID:  HOPE HOLST, DOB February 04, 1956, MRN [id number]  Primary Cardiologist: Wilbert Bihari, MD Primary Heart Failure: None Electrophysiologist: Layliana Devins Gladis Norton, MD      History of Present Illness:   Adrian Clark is a 69 y.o. male with h/o atrial fibrillation, sleep apnea, hypertension seen today for routine electrophysiology followup.   Discussed the use of AI scribe software for clinical note transcription with the patient, who gave verbal consent to proceed.  History of Present Illness Adrian Clark is a 69 year old male with aortic aneurysm and atrial fibrillation who presents for routine follow-up. He was referred by his main physician for a heart evaluation scan.  His aortic aneurysm was identified during a heart evaluation scan and was also noted during previous CT scans for his cardiac ablations, currently measuring 4.7 cm. He has a follow-up meeting scheduled on July 20, 2024, and has already undergone a CT scan. An echocardiogram has been requested as he has not had one since 2015.  He has a history of atrial fibrillation but has experienced no episodes in over a year. Consequently, he has not needed to take flecainide  recently.  He mentions gaining about 10 pounds over the last year and wants to start exercising again, particularly biking. He used to ride about 15 miles regularly.  He checks his blood pressure at home and reports it is usually very good, typically less than 130/80 mmHg. He notes that his blood pressure was slightly elevated today due to rushing to the appointment.  He mentions having sleep apnea and plans to see Dr. Bihari for this condition.  he denies chest pain, palpitations, dyspnea, PND, orthopnea, nausea, vomiting, dizziness, syncope, edema, weight gain, or early satiety.   Review of systems complete and found to be negative unless listed in HPI.   EP Information / Studies Reviewed:    EKG is  ordered today. Personal review as below.  EKG Interpretation Date/Time:  Wednesday July 13 2024 08:57:56 EST Ventricular Rate:  59 PR Interval:  210 QRS Duration:  110 QT Interval:  438 QTC Calculation: 433 R Axis:   -44  Text Interpretation: Sinus bradycardia with 1st degree A-V block Left axis deviation Cannot rule out Anterior infarct , age undetermined When compared with ECG of 22-May-2023 08:52, No significant change was found Confirmed by Soyla Bainter ([id number]) on 07/13/2024 8:59:12 AM     Risk Assessment/Calculations:    CHA2DS2-VASc Score = 3   This indicates a 3.2% annual risk of stroke. The patient's score is based upon: CHF History: 0 HTN History: 1 Diabetes History: 0 Stroke History: 0 Vascular Disease History: 1 Age Score: 1 Gender Score: 0            Physical Exam:   VS:  BP 130/86 (BP Location: Right Arm, Patient Position: Sitting, Cuff Size: Large)   Pulse (!) 59   Ht 6' 4 (1.93 m)   Wt 268 lb (121.6 kg)   SpO2 97%   BMI 32.62 kg/m    Wt Readings from Last 3 Encounters:  07/13/24 268 lb (121.6 kg)  01/12/24 261 lb (118.4 kg)  07/22/23 257 lb (116.6 kg)     GEN: Well nourished, well developed in no acute distress NECK: No JVD; No carotid bruits CARDIAC: Regular rate and rhythm, no murmurs, rubs, gallops RESPIRATORY:  Clear to auscultation without rales, wheezing or rhonchi  ABDOMEN: Soft, non-tender, non-distended EXTREMITIES:  No edema; No deformity  ASSESSMENT AND PLAN:    1.  Persistent atrial fibrillation: Post ablation in 2017 and 2022.  On flecainide  as needed.  He is doing well without further episodes.  Pearce Littlefield continue with current management.  2.  Secondary hypercoagulable state: On Xarelto   3.  Ascending aortic aneurysm: Has been referred to cardiac surgery for further monitoring.  4.  Hypertension: Well-controlled  5.  Obstructive sleep apnea: CPAP compliance encouraged  Follow up with EP Team in 12 months  Signed, Shunda Rabadi  Gladis Norton, MD  "

## 2024-07-13 ENCOUNTER — Ambulatory Visit: Admitting: Cardiology

## 2024-07-13 ENCOUNTER — Encounter: Payer: Self-pay | Admitting: Cardiology

## 2024-07-13 VITALS — BP 130/86 | HR 59 | Ht 76.0 in | Wt 268.0 lb

## 2024-07-13 DIAGNOSIS — I4819 Other persistent atrial fibrillation: Secondary | ICD-10-CM

## 2024-07-13 DIAGNOSIS — I10 Essential (primary) hypertension: Secondary | ICD-10-CM

## 2024-07-13 DIAGNOSIS — G4733 Obstructive sleep apnea (adult) (pediatric): Secondary | ICD-10-CM

## 2024-07-13 DIAGNOSIS — D6869 Other thrombophilia: Secondary | ICD-10-CM | POA: Diagnosis not present

## 2024-07-15 ENCOUNTER — Ambulatory Visit (INDEPENDENT_AMBULATORY_CARE_PROVIDER_SITE_OTHER)

## 2024-07-15 DIAGNOSIS — I7121 Aneurysm of the ascending aorta, without rupture: Secondary | ICD-10-CM | POA: Diagnosis not present

## 2024-07-15 MED ORDER — PERFLUTREN LIPID MICROSPHERE
1.0000 mL | INTRAVENOUS | Status: AC | PRN
  Administered 2024-07-15: 5 mL via INTRAVENOUS

## 2024-07-16 LAB — ECHOCARDIOGRAM COMPLETE
AR max vel: 3.33 cm2
AV Area VTI: 3.27 cm2
AV Area mean vel: 2.95 cm2
AV Mean grad: 4 mmHg
AV Peak grad: 6.9 mmHg
Ao pk vel: 1.31 m/s
Area-P 1/2: 3.66 cm2
S' Lateral: 3.61 cm

## 2024-07-20 ENCOUNTER — Ambulatory Visit

## 2024-07-20 VITALS — BP 122/70 | HR 62 | Resp 18 | Ht 76.0 in | Wt 266.0 lb

## 2024-07-20 DIAGNOSIS — I7121 Aneurysm of the ascending aorta, without rupture: Secondary | ICD-10-CM

## 2024-07-20 NOTE — Patient Instructions (Signed)

## 2024-07-20 NOTE — Progress Notes (Signed)
 "      [address] [place] [address]             [id number]            TEJUAN GHOLSON [id number] 04/18/1956   History of Present Illness:  Adrian Clark is a 69 year old man with medical history of hypertension, persistent atrial fibrillation, OSA, and BPH who presents for continued 6 month follow up of ascending thoracic aortic aneurysm.  Aneurysm has stayed stable in size and on recent CTA of chest measured 4.7 cm. Echocardiogram on 06/2024 showed that the aortic valve is tricuspid with trivial aortic regurgitation.   He states that he has been doing well. His blood pressure is well controlled with current medication therapy. He is active when it is warm outside with bike riding. 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 He denies chest pain, shortness of breath and lower leg edema.    Medications Ordered Prior to Encounter[1]   ROS: Review of Systems  Constitutional:  Negative for fever and malaise/fatigue.  Respiratory:  Negative for cough and shortness of breath.   Cardiovascular:  Negative for chest pain, palpitations and leg swelling.     BP 122/70   Pulse 62   Resp 18   Ht 6' 4 (1.93 m)   Wt 266 lb (120.7 kg)   SpO2 97%   BMI 32.38 kg/m   Physical Exam Constitutional:      Appearance: Normal appearance.  HENT:     Head: Normocephalic and atraumatic.  Skin:    General: Skin is warm and dry.  Neurological:     General: No focal deficit present.     Mental Status: He is alert and oriented to person, place, and time.      Imaging: ECHOCARDIOGRAM REPORT       Patient Name:   Adrian Clark Date of Exam: 07/15/2024  Medical Rec #:  [id number]       Height:       76.0 in  Accession #:    [id number]      Weight:       268.0 lb  Date of Birth:  Nov 14, 1955        BSA:          2.509 m  Patient Age:    68 years        BP:           134/89 mmHg  Patient Gender: M               HR:           58 bpm.   Exam Location:  Outpatient   Procedure: 2D Echo, Cardiac Doppler, Color Doppler and Intracardiac             Opacification Agent (Both Spectral and Color Flow Doppler were             utilized during procedure).   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 06/03/2021 ablation, Arrythmias:Atrial                  Fibrillation, Signs/Symptoms:Dyspnea; Risk  Factors:Hypertension,  Sleep Apnea and Non-Smoker. Patient denies chest pain and  leg                 edema. He does have intermittent DOE.    Sonographer:    Annabella Cater RVT, RDCS (AE), RDMS  Referring Phys: [id number] BRYAN K BARTLE   IMPRESSIONS     1. Left ventricular ejection fraction, by estimation, is 55 to 60%. The  left ventricle has normal function. The left ventricle has no regional  wall motion abnormalities. There is mild left ventricular hypertrophy.  Left ventricular diastolic parameters  were normal.   2. Right ventricular systolic function is normal. The right ventricular  size is normal. There is normal pulmonary artery systolic pressure. The  estimated right ventricular systolic pressure is 29.4 mmHg.   3. The mitral valve is normal in structure. Trivial mitral valve  regurgitation. No evidence of mitral stenosis.   4. The aortic valve is tricuspid. Aortic valve regurgitation is trivial.  No aortic stenosis is present.   5. Aortic dilatation noted. Aneurysm of the ascending aorta, measuring 47  mm. There is moderate dilatation of the ascending aorta. There is mild  dilatation of the aortic root, measuring 44 mm.   6. The inferior vena cava is normal in size with greater than 50%  respiratory variability, suggesting right atrial pressure of 3 mmHg.   FINDINGS   Left Ventricle: Left ventricular ejection fraction, by estimation, is 55  to 60%. The left ventricle has  normal function. The left ventricle has no  regional wall motion abnormalities. Definity  contrast agent was given IV  to delineate the left ventricular   endocardial borders. The left ventricular internal cavity size was normal  in size. There is mild left ventricular hypertrophy. Left ventricular  diastolic parameters were normal.   Right Ventricle: The right ventricular size is normal. No increase in  right ventricular wall thickness. Right ventricular systolic function is  normal. There is normal pulmonary artery systolic pressure. The tricuspid  regurgitant velocity is 2.57 m/s, and   with an assumed right atrial pressure of 3 mmHg, the estimated right  ventricular systolic pressure is 29.4 mmHg.   Left Atrium: Left atrial size was normal in size.   Right Atrium: Right atrial size was normal in size.   Pericardium: There is no evidence of pericardial effusion.   Mitral Valve: The mitral valve is normal in structure. Trivial mitral  valve regurgitation. No evidence of mitral valve stenosis.   Tricuspid Valve: The tricuspid valve is normal in structure. Tricuspid  valve regurgitation is trivial. No evidence of tricuspid stenosis.   Aortic Valve: The aortic valve is tricuspid. Aortic valve regurgitation is  trivial. No aortic stenosis is present. Aortic valve mean gradient  measures 4.0 mmHg. Aortic valve peak gradient measures 6.9 mmHg. Aortic  valve area, by VTI measures 3.27 cm.   Pulmonic Valve: The pulmonic valve was normal in structure. Pulmonic valve  regurgitation is mild. No evidence of pulmonic stenosis.   Aorta: Aortic dilatation noted. There is moderate dilatation of the  ascending aorta. There is mild dilatation of the aortic root, measuring 44  mm. There is an aneurysm involving the ascending aorta measuring 47 mm.   Venous: The inferior vena cava is normal in size with greater than 50%  respiratory variability, suggesting right atrial pressure of 3 mmHg.    IAS/Shunts: The atrial septum is grossly normal.     LEFT VENTRICLE  PLAX 2D  LVIDd:  4.90 cm   Diastology  LVIDs:         3.61 cm   LV e' medial:    10.90 cm/s  LV PW:         1.33 cm   LV E/e' medial:  10.1  LV IVS:        1.15 cm   LV e' lateral:   14.20 cm/s  LVOT diam:     2.30 cm   LV E/e' lateral: 7.7  LV SV:         100  LV SV Index:   40  LVOT Area:     4.15 cm     RIGHT VENTRICLE  RV S prime:     13.30 cm/s  PULMONARY VEINS  TAPSE (M-mode): 3.4 cm      Diastolic Velocity: 78.60 cm/s                              S/D Velocity:       0.60                              Systolic Velocity:  49.70 cm/s   LEFT ATRIUM             Index        RIGHT ATRIUM           Index  LA diam:        3.90 cm 1.55 cm/m   RA Area:     21.00 cm  LA Vol (A2C):   64.8 ml 25.83 ml/m  RA Volume:   58.00 ml  23.12 ml/m  LA Vol (A4C):   62.5 ml 24.91 ml/m  LA Biplane Vol: 67.8 ml 27.03 ml/m   AORTIC VALVE                    PULMONIC VALVE  AV Area (Vmax):    3.33 cm     PV Vmax:          1.00 m/s  AV Area (Vmean):   2.95 cm     PV Peak grad:     4.0 mmHg  AV Area (VTI):     3.27 cm     PR End Diast Vel: 4.41 msec  AV Vmax:           131.00 cm/s  AV Vmean:          87.900 cm/s  AV VTI:            0.306 m  AV Peak Grad:      6.9 mmHg  AV Mean Grad:      4.0 mmHg  LVOT Vmax:         105.00 cm/s  LVOT Vmean:        62.400 cm/s  LVOT VTI:          0.241 m  LVOT/AV VTI ratio: 0.79    AORTA  Ao Root diam: 4.40 cm  Ao Asc diam:  4.70 cm  Ao Arch diam: 3.5 cm   MITRAL VALVE                TRICUSPID VALVE  MV Area (PHT): 3.66 cm     TR Peak grad:   26.4 mmHg  MV Decel Time: 207 msec     TR Vmax:        257.00 cm/s  MV E velocity: 110.00 cm/s  MV A velocity: 56.20 cm/s   SHUNTS  MV E/A ratio:  1.96         Systemic VTI:  0.24 m                              Systemic Diam: 2.30 cm   Soyla Merck MD  Electronically signed by Soyla Merck MD  Signature Date/Time:  07/16/2024/5:32:19 PM   EXAM: CTA CHEST AORTA 07/06/2024 01:26:40 PM   TECHNIQUE: CTA of the chest was performed after the administration of intravenous contrast. Multiplanar reformatted images are provided for review. MIP images are provided for review. Automated exposure control, iterative reconstruction, and/or weight based adjustment of the mA/kV was utilized to reduce the radiation dose to as low as reasonably achievable.   CONTRAST: 75 ml of Omnipaque  350.   COMPARISON: CTA Chest with IV contrast 01/04/2024; 07/14/2023.   CLINICAL HISTORY: Aortic aneurysm suspected. Follow up ascending thoracic aortic aneurysm.   FINDINGS:   AORTA: Fusiform dilatation of the ascending thoracic aorta at 4.7 cm maximum transverse diameter. Arch 3.7 cm diameter, proximal descending 3.6 cm, tapering into normal caliber at the diaphragm. Mild calcified plaque in the descending segment. No thoracic aortic dissection. No intramural hematoma, aortic ulceration, or rupture.   MEDIASTINUM: No mediastinal lymphadenopathy. The heart and pericardium demonstrate no acute abnormality.   LYMPH NODES: No mediastinal, hilar or axillary lymphadenopathy.   LUNGS AND PLEURA: Minimal dependent atelectasis or scarring in the lung bases. 9 mm nodule superior segment left lower lobe (6:99) stable since 2017 consistent with benign process. No focal consolidation or pulmonary edema. No pleural effusion or pneumothorax.   UPPER ABDOMEN: Cholecystectomy clips.   SOFT TISSUES AND BONES: Vertebral endplate spurring at multiple levels in the lower thoracic spine. No acute bone or soft tissue abnormality.   IMPRESSION: 1. Fusiform dilatation of the ascending thoracic aorta measuring up to 4.7 cm in maximum transverse diameter, stable compared to prior studies. Continued surveillance recommended.   Electronically signed by: Katheleen Faes MD MD 07/06/2024 04:49 PM EST RP Workstation:  [id number]   A/P:  Aneurysm of ascending aorta without rupture -4.7 cm ascending thoracic aortic aneurysm on CTA of chest. Echocardiogram showed that the aortic valve was tricuspid.  -We discussed the natural history and and risk factors for growth of ascending aortic aneurysms. Discussed recommendations to minimize the risk of further expansion or dissection including careful blood pressure control, avoidance of contact sports and heavy lifting, attention to lipid management.  We covered the importance of staying never user of tobacco.  The patient does not yet meet surgical criteria of >5.5cm. The patient is aware of signs and symptoms of aortic dissection and when to present to the emergency department   -Follow up in 6 months with CTA of chest for continued surveillance    Risk Modification:  Statin:  not currently prescribed  Smoking cessation instruction/counseling given:  never user  Patient was counseled on importance of Blood Pressure Control  They are instructed to contact their Primary Care Physician if they start to have blood pressure readings over 130s/90s. Do not ever stop blood pressure medications on your own, unless instructed by healthcare professional.  Please avoid use of Fluoroquinolones as this can potentially increase your risk of Aortic Rupture and/or Dissection  Patient educated on signs and symptoms of Aortic Dissection, handout also provided in AVS  Manuelita CHRISTELLA Rough, PA-C 07/20/24     [  1]  Current Outpatient Medications on File Prior to Visit  Medication Sig Dispense Refill   amLODipine -benazepril  (LOTREL) 10-40 MG capsule Take 1 capsule by mouth daily. 90 capsule 0   finasteride (PROSCAR) 5 MG tablet Take 5 mg by mouth daily.     flecainide  (TAMBOCOR ) 100 MG tablet Take 1 tablet (100 mg total) by mouth 2 (two) times daily. (Patient taking differently: Take 100 mg by mouth as needed (breakthrough afib).) 180 tablet 3   hydrochlorothiazide  (HYDRODIURIL )  25 MG tablet TAKE 1 TABLET BY MOUTH DAILY 90 tablet 0   metoprolol  succinate (TOPROL -XL) 50 MG 24 hr tablet Take 1 tablet (50 mg total) by mouth daily. Take with or immediately following a meal. 90 tablet 0   rivaroxaban  (XARELTO ) 20 MG TABS tablet Take 1 tablet (20 mg total) by mouth daily as needed (for episodes of afib). 30 tablet 6   solifenacin (VESICARE) 5 MG tablet Take 5 mg by mouth daily. (Patient taking differently: Take 10 mg by mouth daily.)     tamsulosin (FLOMAX) 0.4 MG CAPS capsule Take 0.4 mg by mouth daily. (Patient taking differently: Take 0.4 mg by mouth 2 (two) times daily.)     No current facility-administered medications on file prior to visit.   "
